# Patient Record
Sex: Female | Born: 1955 | Race: White | Hispanic: No | Marital: Married | Smoking: Never smoker
Health system: Southern US, Community
[De-identification: ages and names within clinical notes are randomized; demographics above are authoritative.]

## PROBLEM LIST (undated history)

## (undated) DIAGNOSIS — M1990 Unspecified osteoarthritis, unspecified site: Secondary | ICD-10-CM

## (undated) DIAGNOSIS — E785 Hyperlipidemia, unspecified: Secondary | ICD-10-CM

## (undated) DIAGNOSIS — K2100 Gastro-esophageal reflux disease with esophagitis, without bleeding: Secondary | ICD-10-CM

## (undated) DIAGNOSIS — M25569 Pain in unspecified knee: Secondary | ICD-10-CM

## (undated) DIAGNOSIS — M722 Plantar fascial fibromatosis: Secondary | ICD-10-CM

## (undated) DIAGNOSIS — E669 Obesity, unspecified: Secondary | ICD-10-CM

## (undated) DIAGNOSIS — J45909 Unspecified asthma, uncomplicated: Secondary | ICD-10-CM

## (undated) DIAGNOSIS — J309 Allergic rhinitis, unspecified: Secondary | ICD-10-CM

## (undated) DIAGNOSIS — K219 Gastro-esophageal reflux disease without esophagitis: Secondary | ICD-10-CM

## (undated) DIAGNOSIS — E7800 Pure hypercholesterolemia, unspecified: Secondary | ICD-10-CM

## (undated) DIAGNOSIS — I10 Essential (primary) hypertension: Secondary | ICD-10-CM

## (undated) HISTORY — DX: Pain in unspecified knee: M25.569

## (undated) HISTORY — PX: ABDOMINAL HYSTERECTOMY: SHX81

## (undated) HISTORY — DX: Essential (primary) hypertension: I10

## (undated) HISTORY — DX: Gastro-esophageal reflux disease with esophagitis, without bleeding: K21.00

## (undated) HISTORY — DX: Plantar fascial fibromatosis: M72.2

## (undated) HISTORY — PX: OTHER SURGICAL HISTORY: SHX169

## (undated) HISTORY — PX: TUBAL LIGATION: SHX77

## (undated) HISTORY — DX: Hyperlipidemia, unspecified: E78.5

## (undated) HISTORY — DX: Unspecified osteoarthritis, unspecified site: M19.90

## (undated) HISTORY — PX: DILATION AND CURETTAGE OF UTERUS: SHX78

## (undated) HISTORY — DX: Allergic rhinitis, unspecified: J30.9

## (undated) HISTORY — DX: Obesity, unspecified: E66.9

## (undated) MED FILL — FINASTERIDE 1 MG TABLET: 1 1 mg | ORAL | 90 days supply | Qty: 90 | Fill #2 | Status: CN

## (undated) MED FILL — FINASTERIDE 1 MG TABLET: 1 1 mg | ORAL | 90 days supply | Qty: 90 | Fill #1

## (undated) MED FILL — FINASTERIDE 1 MG TABLET: 1 mg | ORAL | 90 days supply | Qty: 30 | Fill #3

## (undated) MED FILL — FINASTERIDE 1 MG TABLET: 1 1 mg | ORAL | 90 days supply | Qty: 90 | Fill #2

## (undated) MED FILL — FINASTERIDE 1 MG TABLET: 1 mg | ORAL | 90 days supply | Qty: 90 | Fill #0

## (undated) MED FILL — FINASTERIDE 1 MG TABLET: 1 mg | ORAL | 90 days supply | Qty: 45 | Fill #2

---

## 1999-04-13 ENCOUNTER — Encounter: Payer: Self-pay | Admitting: *Deleted

## 1999-04-13 ENCOUNTER — Ambulatory Visit (HOSPITAL_COMMUNITY): Admission: RE | Admit: 1999-04-13 | Discharge: 1999-04-13 | Payer: Self-pay | Admitting: *Deleted

## 1999-04-19 ENCOUNTER — Other Ambulatory Visit: Admission: RE | Admit: 1999-04-19 | Discharge: 1999-04-19 | Payer: Self-pay | Admitting: *Deleted

## 2000-04-17 ENCOUNTER — Ambulatory Visit (HOSPITAL_COMMUNITY): Admission: RE | Admit: 2000-04-17 | Discharge: 2000-04-17 | Payer: Self-pay | Admitting: *Deleted

## 2000-04-17 ENCOUNTER — Encounter: Payer: Self-pay | Admitting: *Deleted

## 2000-06-19 ENCOUNTER — Other Ambulatory Visit: Admission: RE | Admit: 2000-06-19 | Discharge: 2000-06-19 | Payer: Self-pay | Admitting: *Deleted

## 2001-06-02 ENCOUNTER — Other Ambulatory Visit: Admission: RE | Admit: 2001-06-02 | Discharge: 2001-06-02 | Payer: Self-pay | Admitting: *Deleted

## 2001-06-02 ENCOUNTER — Ambulatory Visit (HOSPITAL_COMMUNITY): Admission: RE | Admit: 2001-06-02 | Discharge: 2001-06-02 | Payer: Self-pay | Admitting: *Deleted

## 2001-06-02 ENCOUNTER — Encounter: Payer: Self-pay | Admitting: *Deleted

## 2001-11-12 ENCOUNTER — Ambulatory Visit (HOSPITAL_COMMUNITY): Admission: RE | Admit: 2001-11-12 | Discharge: 2001-11-12 | Payer: Self-pay | Admitting: Pulmonary Disease

## 2001-11-20 ENCOUNTER — Ambulatory Visit (HOSPITAL_COMMUNITY): Admission: RE | Admit: 2001-11-20 | Discharge: 2001-11-20 | Payer: Self-pay | Admitting: Pulmonary Disease

## 2001-12-23 ENCOUNTER — Ambulatory Visit (HOSPITAL_COMMUNITY): Admission: RE | Admit: 2001-12-23 | Discharge: 2001-12-23 | Payer: Self-pay | Admitting: Pulmonary Disease

## 2001-12-30 ENCOUNTER — Ambulatory Visit (HOSPITAL_COMMUNITY): Admission: RE | Admit: 2001-12-30 | Discharge: 2001-12-30 | Payer: Self-pay | Admitting: Internal Medicine

## 2002-06-02 ENCOUNTER — Other Ambulatory Visit: Admission: RE | Admit: 2002-06-02 | Discharge: 2002-06-02 | Payer: Self-pay | Admitting: *Deleted

## 2003-06-07 ENCOUNTER — Encounter: Payer: Self-pay | Admitting: *Deleted

## 2003-06-07 ENCOUNTER — Ambulatory Visit (HOSPITAL_COMMUNITY): Admission: RE | Admit: 2003-06-07 | Discharge: 2003-06-07 | Payer: Self-pay | Admitting: *Deleted

## 2003-10-17 ENCOUNTER — Other Ambulatory Visit: Admission: RE | Admit: 2003-10-17 | Discharge: 2003-10-17 | Payer: Self-pay | Admitting: *Deleted

## 2004-07-24 ENCOUNTER — Ambulatory Visit (HOSPITAL_COMMUNITY): Admission: RE | Admit: 2004-07-24 | Discharge: 2004-07-24 | Payer: Self-pay | Admitting: *Deleted

## 2004-10-15 ENCOUNTER — Other Ambulatory Visit: Admission: RE | Admit: 2004-10-15 | Discharge: 2004-10-15 | Payer: Self-pay | Admitting: *Deleted

## 2005-10-11 ENCOUNTER — Ambulatory Visit (HOSPITAL_COMMUNITY): Admission: RE | Admit: 2005-10-11 | Discharge: 2005-10-11 | Payer: Self-pay | Admitting: *Deleted

## 2005-10-16 ENCOUNTER — Other Ambulatory Visit: Admission: RE | Admit: 2005-10-16 | Discharge: 2005-10-16 | Payer: Self-pay | Admitting: *Deleted

## 2005-10-21 ENCOUNTER — Ambulatory Visit (HOSPITAL_COMMUNITY): Admission: RE | Admit: 2005-10-21 | Discharge: 2005-10-21 | Payer: Self-pay | Admitting: *Deleted

## 2005-10-29 ENCOUNTER — Ambulatory Visit (HOSPITAL_COMMUNITY): Admission: RE | Admit: 2005-10-29 | Discharge: 2005-10-29 | Payer: Self-pay | Admitting: Pulmonary Disease

## 2006-11-10 ENCOUNTER — Ambulatory Visit (HOSPITAL_COMMUNITY): Admission: RE | Admit: 2006-11-10 | Discharge: 2006-11-10 | Payer: Self-pay | Admitting: *Deleted

## 2006-12-11 ENCOUNTER — Other Ambulatory Visit: Admission: RE | Admit: 2006-12-11 | Discharge: 2006-12-11 | Payer: Self-pay | Admitting: *Deleted

## 2007-01-21 LAB — T4, FREE: T4, Free: 0.9 ng/dL (ref 0.6–1.2)

## 2007-01-21 LAB — TSH: TSH - Thyroid Stimulating Hormone: 0.6 microIU/mL (ref 0.40–5.80)

## 2007-08-03 LAB — CBC WITH AUTO DIFFERENTIAL
Basophils %: 0 % (ref 0–2)
Basophils, Absolute: 0 10*3/uL (ref 0–0.2)
Eosinophils %: 2 % (ref 0–7)
Eosinophils, Absolute: 0.1 10*3/uL (ref 0–0.7)
HCT: 42.2 % (ref 37.0–48.0)
Hgb: 14.3 gm/dL (ref 12.0–16.0)
Lymphocytes %: 28 % (ref 25–45)
Lymphocytes, Absolute: 1.5 10*3/uL (ref 1.1–4.3)
MCH: 31.3 pg (ref 27–34)
MCHC: 34 gm/dL (ref 32–36)
MCV: 91.9 fL (ref 81–99)
MPV: 9.8 fL (ref 7.4–10.4)
Monocytes %: 10 % (ref 0–12)
Monocytes, Absolute: 0.5 10*3/uL (ref 0–1.2)
Neutrophils, Absolute: 3.3 10*3/uL (ref 1.6–7.3)
Platelet Count: 207 10*3/uL (ref 150–400)
RBC: 4.59 10*6/uL (ref 4.20–5.40)
RDW: 12.6 % (ref 11.5–14.5)
Segs (Neutrophils)%: 60 % (ref 35–70)
WBC: 5.5 10*3/uL (ref 4.8–10.8)

## 2007-08-03 LAB — URINALYSIS, ROUTINE
Bilirubin, urine: NEGATIVE
Glucose, UA: NEGATIVE mg/dl
Ketones, urine: 40 — AB
Nitrite, urine: NEGATIVE
Protein, urine: NEGATIVE
Specific Gravity, urine: 1.02 (ref 1.002–1.035)
Urobilinogen, urine: NEGATIVE EU/dL
pH, UA: 6 (ref 5.0–9.0)

## 2007-08-03 LAB — TYPE AND SCREEN
ABO/Rh (D): A POS
ABO/Rh recheck: A POS
Antibody Screen: NEGATIVE

## 2007-08-03 LAB — UA, MICROSCOPIC ONLY
RBC, UA: 5 /HPF — AB
Squamous Epithelial, UA: 0 /HPF (ref 0–5)
WBC, UA: >25 /HPF — AB

## 2007-08-04 LAB — CBC WITH AUTO DIFFERENTIAL
Bands %: 1 % (ref 0–10)
Bands, Absolute: 0.07 10*3/uL (ref 0–1.20)
Basophils %: 0 % (ref 0–2)
Basophils, Absolute: 0 10*3/uL (ref 0–0.2)
Eosinophils %: 0 % (ref 0–7)
Eosinophils, Absolute: 0 10*3/uL (ref 0–0.7)
HCT: 37 % (ref 37.0–48.0)
Hgb: 12.4 gm/dL (ref 12.0–16.0)
Lymphocytes %: 11 % — ABNORMAL LOW (ref 25–45)
Lymphocytes %: 8 % — ABNORMAL LOW (ref 25–45)
Lymphocytes, Absolute: 0.6 10*3/uL — ABNORMAL LOW (ref 1.1–4.3)
Lymphocytes, Absolute: 0.81 10*3/uL — ABNORMAL LOW (ref 1.10–4.30)
MCH: 31.3 pg (ref 27–34)
MCHC: 33.5 gm/dL (ref 32–36)
MCV: 93.3 fL (ref 81–99)
MPV: 10.5 fL — ABNORMAL HIGH (ref 7.4–10.4)
Monocytes %: 7 % (ref 0–12)
Monocytes %: 8 % (ref 0–12)
Monocytes, Absolute: 0.5 10*3/uL (ref 0–1.2)
Monocytes, Absolute: 0.59 10*3/uL (ref 0–1.20)
Neutrophils, Absolute: 6.4 10*3/uL (ref 1.6–7.3)
Platelet Count: 178 10*3/uL (ref 150–400)
Platelet Estimate: NORMAL
RBC Morphology: NORMAL
RBC: 3.97 10*6/uL — ABNORMAL LOW (ref 4.20–5.40)
RDW: 13.3 % (ref 11.5–14.5)
Segs %: 80 % — ABNORMAL HIGH (ref 35–70)
Segs (Neutrophils)%: 85 % — ABNORMAL HIGH (ref 35–70)
Segs, Absolute: 5.93 10*3/uL (ref 1.60–7.30)
WBC: 7.4 10*3/uL (ref 4.8–10.8)

## 2007-09-16 LAB — PROGESTERONE: Progesterone: 1.5 ng/mL (ref 0.1–20.0)

## 2007-09-16 LAB — 25-OH HYDROXY VITAMIN D, CALCIFEROL, TOTAL OF D2 & D3
Vitamin D Total: 27 ng/mL (ref 25–80)
Vitamin D, 25-OH Total: <4 ng/mL
Vitiamin D 25 Hydroxy D3: 27 ng/mL

## 2007-09-16 LAB — ESTRADIOL: Estradiol: 18 pg/mL

## 2008-01-04 ENCOUNTER — Encounter: Admission: RE | Admit: 2008-01-04 | Discharge: 2008-01-04 | Payer: Self-pay | Admitting: *Deleted

## 2008-01-12 ENCOUNTER — Encounter: Admission: RE | Admit: 2008-01-12 | Discharge: 2008-01-12 | Payer: Self-pay | Admitting: *Deleted

## 2008-02-05 LAB — ESTRADIOL: Estradiol: 15 pg/mL

## 2008-02-05 LAB — 25-OH HYDROXY VITAMIN D, CALCIFEROL, TOTAL OF D2 & D3
Vitamin D Total: 66 ng/mL (ref 25–80)
Vitamin D, 25-OH Total: <4 ng/mL
Vitiamin D 25 Hydroxy D3: 66 ng/mL

## 2008-02-05 LAB — PROGESTERONE: Progesterone: 1.1 ng/mL (ref 0.1–20.0)

## 2008-03-24 ENCOUNTER — Ambulatory Visit (HOSPITAL_COMMUNITY): Admission: RE | Admit: 2008-03-24 | Discharge: 2008-03-24 | Payer: Self-pay | Admitting: Pulmonary Disease

## 2008-03-30 ENCOUNTER — Encounter (HOSPITAL_COMMUNITY): Admission: RE | Admit: 2008-03-30 | Discharge: 2008-04-29 | Payer: Self-pay | Admitting: Pulmonary Disease

## 2008-07-13 LAB — COMPREHENSIVE METABOLIC PANEL
ALT - Alanine Amino transferase: 19 IU/L (ref 5–40)
AST - Aspartate Aminotransferase: 24 IU/L (ref 10–42)
Albumin/Globulin Ratio: 1.3 (ref >0.9)
Albumin: 3.8 gm/dL (ref 3.5–5.0)
Alkaline Phosphatase: 35 IU/L — ABNORMAL LOW (ref 37–107)
Anion Gap: 11 mmol/L (ref 3–11)
BUN: 15 mg/dL (ref 8–21)
Bilirubin, Total: 0.5 mg/dL (ref 0.2–1.2)
CO2 - Carbon Dioxide: 27 mmol/L (ref 22–31)
Calcium: 9.2 mg/dL (ref 8.5–10.5)
Chloride: 99 mmol/L (ref 98–111)
Creatinine, Serum: 0.6 mg/dL (ref 0.6–1.3)
GFR Estimate: >60 mL/min/{1.73_m2} (ref >60)
Globulin: 3 gm/dL (ref 2.2–3.7)
Glucose: 95 mg/dL (ref 80–99)
Potassium: 4 mmol/L (ref 3.5–5.1)
Protein, Total: 6.8 gm/dL (ref 6.1–7.9)
Sodium: 137 mmol/L (ref 133–147)

## 2008-07-13 LAB — ESTRADIOL: Estradiol: 83 pg/mL

## 2008-07-13 LAB — 25-OH HYDROXY VITAMIN D, CALCIFEROL, TOTAL OF D2 & D3
Vitamin D Total: 58 ng/mL (ref 25–80)
Vitamin D, 25-OH Total: <4 ng/mL
Vitiamin D 25 Hydroxy D3: 58 ng/mL

## 2008-07-13 LAB — PROGESTERONE: Progesterone: 6.2 ng/mL (ref 0.1–20.0)

## 2008-07-13 LAB — T3, FREE: T3, Free: 2.8 pg/mL (ref 2.4–3.9)

## 2008-07-13 LAB — TSH: TSH - Thyroid Stimulating Hormone: 0.59 microIU/mL (ref 0.40–5.80)

## 2010-05-10 LAB — CBC WITH AUTO DIFFERENTIAL
Basophils %: 0 % (ref 0–2)
Basophils, Absolute: 0 10*3/uL (ref 0–0.2)
Eosinophils %: 1 % (ref 0–7)
Eosinophils, Absolute: 0.1 10*3/uL (ref 0–0.7)
HCT: 42.7 % (ref 37.0–48.0)
Hgb: 14.2 gm/dL (ref 12.0–16.0)
Lymphocytes %: 24 % — ABNORMAL LOW (ref 25–45)
Lymphocytes, Absolute: 1.9 10*3/uL (ref 1.1–4.3)
MCH: 31.6 pg (ref 27–34)
MCHC: 33.3 gm/dL (ref 32–36)
MCV: 94.7 fL (ref 81–99)
MPV: 10.7 fL — ABNORMAL HIGH (ref 7.4–10.4)
Monocytes %: 6 % (ref 0–12)
Monocytes, Absolute: 0.5 10*3/uL (ref 0–1.2)
Neutrophils, Absolute: 5.4 10*3/uL (ref 1.6–7.3)
Platelet Count: 178 10*3/uL (ref 150–400)
RBC: 4.51 10*6/uL (ref 4.20–5.40)
RDW: 13.6 % (ref 11.5–14.5)
Segs (Neutrophils)%: 68 % (ref 35–70)
WBC: 8 10*3/uL (ref 4.8–10.8)

## 2010-05-10 LAB — CORONARY RISK LIPID PANEL
Cholesterol (CRISK): 220 mg/dL — ABNORMAL HIGH (ref <200)
Cholesterol, HDL: 70 mg/dL (ref 35–80)
LDL Calculated: 140 mg/dL — ABNORMAL HIGH (ref <100)
Triglycerides (Crisk): 49 mg/dL (ref 40–210)

## 2010-05-10 LAB — HOMOCYSTEINE, PLASMA: Homocysteine: 8.3 micromol/L (ref 5.0–13.9)

## 2010-05-10 LAB — TESTOSTERONE, TOTAL AND FREE, SERUM (MAYO)
Testosterone, Free (ng/dL): 0.2 ng/dL — ABNORMAL LOW (ref 0.3–1.9)
Testosterone, Total: 16 ng/dL (ref 8–60)

## 2010-05-10 LAB — DHEA-SULFATE PANEL: DHEA-Sulfate Panel: 146 microgm/dL (ref 8–188)

## 2010-05-10 LAB — 25-OH HYDROXY VITAMIN D, CALCIFEROL, TOTAL OF D2 & D3: Vitamin D, 25-OH Total: 61 ng/mL (ref 30–100)

## 2010-05-10 LAB — T3, FREE: T3, Free: 2.9 pg/mL (ref 2.4–3.9)

## 2010-05-10 LAB — ESTRADIOL: Estradiol: 40 pg/mL

## 2010-05-10 LAB — FOLLICLE STIMULATING HORMONE: FSH: 67.7 m[IU]/mL

## 2010-05-10 LAB — TSH: TSH - Thyroid Stimulating Hormone: 1.07 microIU/mL (ref 0.40–5.80)

## 2010-05-10 LAB — PROGESTERONE: Progesterone: 2.3 ng/mL (ref 0.1–18.6)

## 2010-09-14 LAB — ESTRADIOL: Estradiol: 138 pg/mL

## 2010-09-14 LAB — PROGESTERONE: Progesterone: 2.5 ng/mL (ref 0.1–18.6)

## 2010-09-14 LAB — HOMOCYSTEINE, PLASMA: Homocysteine: 2.1 micromol/L — ABNORMAL LOW (ref 5.0–13.9)

## 2010-09-16 ENCOUNTER — Encounter: Payer: Self-pay | Admitting: *Deleted

## 2010-10-23 LAB — CORONARY RISK LIPID PANEL
Cholesterol (CRISK): 206 mg/dL — ABNORMAL HIGH (ref <200)
Cholesterol, HDL: 71 mg/dL (ref 35–80)
LDL Calculated: 123 mg/dL — ABNORMAL HIGH (ref <100)
Triglycerides (Crisk): 59 mg/dL (ref 40–210)

## 2010-10-23 LAB — COMPREHENSIVE METABOLIC PANEL
ALT - Alanine Amino transferase: 18 IU/L (ref 10–45)
AST - Aspartate Aminotransferase: 23 IU/L (ref 10–42)
Albumin/Globulin Ratio: 1.2 (ref >0.9)
Albumin: 3.8 gm/dL (ref 3.5–5.0)
Alkaline Phosphatase: 35 IU/L — ABNORMAL LOW (ref 37–107)
Anion Gap: 8 mmol/L (ref 3–11)
BUN: 10 mg/dL (ref 8–21)
Bilirubin, Total: 0.8 mg/dL (ref 0.2–1.4)
CO2 - Carbon Dioxide: 25 mmol/L (ref 22–31)
Calcium: 9.1 mg/dL (ref 8.6–10.0)
Chloride: 104 mmol/L (ref 98–111)
Creatinine, Serum: 0.69 mg/dL (ref 0.44–1.03)
GFR Estimate: >60 mL/min/{1.73_m2} (ref >60)
Globulin: 3.3 gm/dL (ref 2.2–3.7)
Glucose: 81 mg/dL (ref 80–99)
Potassium: 3.5 mmol/L (ref 3.5–5.1)
Protein, Total: 7.1 gm/dL (ref 6.1–7.9)
Sodium: 137 mmol/L (ref 135–143)

## 2010-10-23 LAB — CBC WITH AUTO DIFFERENTIAL
Basophils %: 1 % (ref 0–2)
Basophils, Absolute: 0 10*3/uL (ref 0–0.2)
Eosinophils %: 2 % (ref 0–7)
Eosinophils, Absolute: 0.1 10*3/uL (ref 0–0.7)
HCT: 41.8 % (ref 37.0–48.0)
Hgb: 13.9 gm/dL (ref 12.0–16.0)
Lymphocytes %: 26 % (ref 25–45)
Lymphocytes, Absolute: 1.7 10*3/uL (ref 1.1–4.3)
MCH: 31.9 pg (ref 27–34)
MCHC: 33.2 gm/dL (ref 32–36)
MCV: 96 fL (ref 81–99)
MPV: 10.9 fL — ABNORMAL HIGH (ref 7.4–10.4)
Monocytes %: 9 % (ref 0–12)
Monocytes, Absolute: 0.6 10*3/uL (ref 0–1.2)
Neutrophils, Absolute: 4.1 10*3/uL (ref 1.6–7.3)
Platelet Count: 194 10*3/uL (ref 150–400)
RBC: 4.36 10*6/uL (ref 4.20–5.40)
RDW: 13.5 % (ref 11.5–14.5)
Segs (Neutrophils)%: 62 % (ref 35–70)
WBC: 6.5 10*3/uL (ref 4.8–10.8)

## 2010-10-23 LAB — TSH: TSH - Thyroid Stimulating Hormone: 0.53 microIU/mL (ref 0.40–5.80)

## 2010-12-06 ENCOUNTER — Ambulatory Visit (HOSPITAL_COMMUNITY)
Admission: RE | Admit: 2010-12-06 | Discharge: 2010-12-06 | Disposition: A | Discharge status: Home / Self Care | Payer: BC Managed Care – PPO | Source: Ambulatory Visit | Attending: Pulmonary Disease | Admitting: Pulmonary Disease

## 2010-12-06 ENCOUNTER — Other Ambulatory Visit (HOSPITAL_COMMUNITY): Payer: Self-pay | Admitting: Pulmonary Disease

## 2010-12-06 DIAGNOSIS — M5030 Other cervical disc degeneration, unspecified cervical region: Secondary | ICD-10-CM | POA: Insufficient documentation

## 2010-12-06 DIAGNOSIS — M542 Cervicalgia: Secondary | ICD-10-CM | POA: Insufficient documentation

## 2010-12-06 DIAGNOSIS — R6884 Jaw pain: Secondary | ICD-10-CM

## 2010-12-06 DIAGNOSIS — M436 Torticollis: Secondary | ICD-10-CM

## 2010-12-19 ENCOUNTER — Ambulatory Visit (HOSPITAL_COMMUNITY)
Admission: RE | Admit: 2010-12-19 | Discharge: 2010-12-19 | Disposition: A | Discharge status: Home / Self Care | Payer: BC Managed Care – PPO | Source: Ambulatory Visit | Attending: Pulmonary Disease | Admitting: Pulmonary Disease

## 2010-12-19 DIAGNOSIS — M6281 Muscle weakness (generalized): Secondary | ICD-10-CM | POA: Insufficient documentation

## 2010-12-19 DIAGNOSIS — I10 Essential (primary) hypertension: Secondary | ICD-10-CM | POA: Insufficient documentation

## 2010-12-19 DIAGNOSIS — IMO0001 Reserved for inherently not codable concepts without codable children: Secondary | ICD-10-CM | POA: Insufficient documentation

## 2010-12-19 DIAGNOSIS — M542 Cervicalgia: Secondary | ICD-10-CM | POA: Insufficient documentation

## 2010-12-19 DIAGNOSIS — M2569 Stiffness of other specified joint, not elsewhere classified: Secondary | ICD-10-CM | POA: Insufficient documentation

## 2010-12-25 ENCOUNTER — Ambulatory Visit (HOSPITAL_COMMUNITY)
Admission: RE | Admit: 2010-12-25 | Discharge: 2010-12-25 | Disposition: A | Discharge status: Home / Self Care | Payer: BC Managed Care – PPO | Source: Ambulatory Visit | Attending: Pulmonary Disease | Admitting: Pulmonary Disease

## 2010-12-25 DIAGNOSIS — I10 Essential (primary) hypertension: Secondary | ICD-10-CM | POA: Insufficient documentation

## 2010-12-25 DIAGNOSIS — M542 Cervicalgia: Secondary | ICD-10-CM | POA: Insufficient documentation

## 2010-12-25 DIAGNOSIS — M2569 Stiffness of other specified joint, not elsewhere classified: Secondary | ICD-10-CM | POA: Insufficient documentation

## 2010-12-25 DIAGNOSIS — M6281 Muscle weakness (generalized): Secondary | ICD-10-CM | POA: Insufficient documentation

## 2010-12-25 DIAGNOSIS — IMO0001 Reserved for inherently not codable concepts without codable children: Secondary | ICD-10-CM | POA: Insufficient documentation

## 2010-12-28 ENCOUNTER — Ambulatory Visit (HOSPITAL_COMMUNITY)
Admission: RE | Admit: 2010-12-28 | Discharge: 2010-12-28 | Disposition: A | Discharge status: Home / Self Care | Payer: BC Managed Care – PPO | Source: Ambulatory Visit | Attending: *Deleted | Admitting: *Deleted

## 2011-01-01 ENCOUNTER — Ambulatory Visit (HOSPITAL_COMMUNITY)
Admission: RE | Admit: 2011-01-01 | Discharge: 2011-01-01 | Disposition: A | Discharge status: Home / Self Care | Payer: BC Managed Care – PPO | Source: Ambulatory Visit | Attending: *Deleted | Admitting: *Deleted

## 2011-01-03 ENCOUNTER — Ambulatory Visit (HOSPITAL_COMMUNITY)
Admission: RE | Admit: 2011-01-03 | Discharge: 2011-01-03 | Disposition: A | Discharge status: Home / Self Care | Payer: BC Managed Care – PPO | Source: Ambulatory Visit | Attending: *Deleted | Admitting: *Deleted

## 2011-01-10 ENCOUNTER — Ambulatory Visit (HOSPITAL_COMMUNITY)
Admission: RE | Admit: 2011-01-10 | Discharge: 2011-01-10 | Disposition: A | Discharge status: Home / Self Care | Payer: BC Managed Care – PPO | Source: Ambulatory Visit | Attending: *Deleted | Admitting: *Deleted

## 2011-01-11 NOTE — Consult Note (Signed)
[facility]  Patient:    Samantha Larsen, Samantha Larsen Visit Number: [id number] MRN: [id number]          Service Type: OUT Location: RAD Attending Physician:  Fredirick Maudlin Dictated by:   Gardiner Coins, P.A.-C. Proc. Date: 12/15/01 Admit Date:  11/20/2001 Discharge Date: 11/20/2001   CC:         Kari Baars, M.D.   Consultation Report  DATE OF BIRTH:  08-Nov-1955.  S.S. NUMBER:  [id number].  REFERRING PHYSICIAN:  Kari Baars, M.D.  CHIEF COMPLAINT:  Questions about cancer screening.  HISTORY OF PRESENT ILLNESS:  Ms. Heyde is a very nice 55 year old Caucasian female referred through the courtesy of Dr. Juanetta Gosling to further discuss colorectal cancer screening.  In January of 2003, she developed some left lower quadrant, left hip pain that radiated down her leg.  She initially thought symptoms may be coming from her ovaries and on ultrasound she, in fact, had left-sided ovarian cysts.  Followup ultrasound 4 weeks later showed resolution of the cyst but she still was having symptoms.  Subsequently had CT and MRI that revealed a ruptured disk in her back that was felt to be the cause of her pain.  She has since been started on prednisone as well as Relafen, ultram and OxyCodone and she has had significant improvement in the pain.  During this time period she had a paternal aunt diagnosed with colon cancer around age 16.  Her father subsequently underwent colonoscopy that revealed multiple hyperplastic polyps.  Also had a paternal great great who had colon cancer as well as bladder cancer.  With her family history she is concerned about initiating colorectal cancer screening.  Since being diagnosed with the ruptured disk in her back she has done fairly well.  She has had heartburn for many years now.  She was initially on Zantac. Underwent EGD by Dr. Lovell Sheehan 6 to 8 years ago at which time she was started on Prevacid 30 mg q.d.  She has no  heartburn or indigestion as long as she takes her Prevacid but if she misses a dose symptoms quickly return.  No nausea or vomiting. Does have intermittent solid food dysphagia for as long as she can remember to certain foods, such as waffles with syrup, soy products, and hamburgers.  She does not feel this is a significant problem for her at this time.  No abdominal pain currently.  Moves her bowels every day.  Had tended towards constipation, "all of my life", but since starting on Prevacid she has had no problems with constipation.  She does see occasional bright red blood per rectum on the toilet tissue when she does have problems with constipation but has not seen any recently.  Last episode was last summer when they were on vacation when she developed some constipation and saw some blood then.  No melena.  Appetite has been good.  She has had weight gain in the last couple of months since being on steroids.  CURRENT MEDICATIONS: 1. Medrol dosepak x11 days which she is just finishing up. 2. Prednisone earlier this year. 3. Relafen 750 mg 2 q.d. x3 weeks for which she has a couple more doses. 4. Ultram 5 mg q.d. p.r.n. 5. Oxycodone p.r.n. q.h.s. 6. ______ 1 b.i.d. p.r.n. 7. Prevacid 30 mg q.d. 8. Clobetasol cream p.r.n.  Prior to back problems the only medication she had been on chronically was the Prevacid.  ALLERGIES:  PENICILLIN causes "welts".  PAST MEDICAL HISTORY:  History of seasonal allergic rhinitis and history of ruptured disk in the back.  PAST SURGICAL HISTORY:  Tubal ligation.  FAMILY HISTORY:  Colon cancer as outlined in HPI.  No family history of chronic liver disease.  She does have a maternal cousin with Crohns disease.  SOCIAL HISTORY:  The patient has been married for 29 years and has a 17 year old son.  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  She does not use any tobacco, alcohol or  illegal drugs.  REVIEW OF SYSTEMS:  As in HPI.  In addition no syncope, chest pain, palpitations, history of MI, dyspnea, cough, wheezing, dysuria, hematuria, vaginal itching, burning or discharge.  PHYSICAL EXAMINATION:  VITAL SIGNS:  Weight 213 pounds.  Height 5 feet 1 inch.  Temperature 98.6. Blood pressure 158/90, pulse 70 and regular.  GENERAL:  Very pleasant, cooperative, alert, well-developed well-nourished woman in no acute distress.  Answers questions quickly and appropriately.  HEENT:  Atraumatic, normocephalic.  PERRL.  Sclerae nonicteric.  Conjunctivae clear.  Oropharynx is pink and moist without lesion, erythema or exudate.  NECK:  Supple, no masses, no adenopathy.  Tracheal midline.  No thyromegaly.  LUNGS: Clear to auscultation bilaterally with symmetrical expansion.  CARDIOVASCULAR:  S1, S2 regular rate and rhythm without murmur.  ABDOMEN:  Positive normoactive bowel sounds.  Soft, full, nontender, nondistended.  Symmetrical.  No guarding.  RECTAL:  Adequate sphincter tone.  Small, soft, anterior, sentinel tag.  No masses in the anal canal or rectal vault.  Small amount of soft brown stool in the rectal vault.  Hemoccult negative.  EXTREMITIES:  No lower extremity edema.  SKIN: Warm and dry without jaundice.  IMPRESSION: 1. COLON CANCER:  The patient is a very nice 55 year old woman with second degree family history of colon cancer and personal history of intermittent hematochezia.  Based on history it sounds as if her hematochezia is coming from benign anorectal source but would like to further evaluate with total colonoscopy of her alternative sources of bleeding such as polyp especially in light of a family history of colon cancer.  Total colonoscopy was reviewed with the patient including potential risks, benefits and alternatives. Questions are answered and she is agreeable to proceeding with procedure.   2. GASTROESOPHAGEAL REFLUX DISEASE:  Symptoms  are very well controlled on daily Prevacid.  Has some intermittent dysphagia but at this point she feels this is no significant problem for her.  If it becomes more of a problem can consider esophagogram or EGD.  RECOMMENDATIONS: 1. Total colonoscopy at [facility] in the near future. 2. Continue Prevacid 30 mg p.o. q.d. 30 minutes before a meal. 3. The patient is to let us know should she have further problems with    dysphagia at which time we consider esophagogram or EGD.  We would like to thank Dr. Juanetta Gosling for allowing [place] to participate in the care of this very nice woman. Dictated by:   Gardiner Coins, P.A.-C. Attending Physician:  Fredirick Maudlin DD:  12/15/01 TD:  12/15/01 Job: [id number]

## 2011-01-14 ENCOUNTER — Ambulatory Visit (HOSPITAL_COMMUNITY)
Admission: RE | Admit: 2011-01-14 | Discharge: 2011-01-14 | Disposition: A | Discharge status: Home / Self Care | Payer: BC Managed Care – PPO | Source: Ambulatory Visit | Attending: *Deleted | Admitting: *Deleted

## 2011-01-16 ENCOUNTER — Ambulatory Visit (HOSPITAL_COMMUNITY)
Admission: RE | Admit: 2011-01-16 | Discharge: 2011-01-16 | Disposition: A | Discharge status: Home / Self Care | Payer: BC Managed Care – PPO | Source: Ambulatory Visit | Attending: *Deleted | Admitting: *Deleted

## 2011-01-18 LAB — ESTRADIOL: Estradiol: 88 pg/mL

## 2011-01-30 ENCOUNTER — Ambulatory Visit (HOSPITAL_COMMUNITY)
Admission: RE | Admit: 2011-01-30 | Discharge: 2011-01-30 | Disposition: A | Discharge status: Home / Self Care | Payer: BC Managed Care – PPO | Source: Ambulatory Visit | Attending: Pulmonary Disease | Admitting: Pulmonary Disease

## 2011-01-30 DIAGNOSIS — M2569 Stiffness of other specified joint, not elsewhere classified: Secondary | ICD-10-CM | POA: Insufficient documentation

## 2011-01-30 DIAGNOSIS — IMO0001 Reserved for inherently not codable concepts without codable children: Secondary | ICD-10-CM | POA: Insufficient documentation

## 2011-01-30 DIAGNOSIS — M6281 Muscle weakness (generalized): Secondary | ICD-10-CM | POA: Insufficient documentation

## 2011-01-30 DIAGNOSIS — M542 Cervicalgia: Secondary | ICD-10-CM | POA: Insufficient documentation

## 2011-01-30 DIAGNOSIS — I10 Essential (primary) hypertension: Secondary | ICD-10-CM | POA: Insufficient documentation

## 2012-01-31 ENCOUNTER — Encounter (INDEPENDENT_AMBULATORY_CARE_PROVIDER_SITE_OTHER): Payer: Self-pay

## 2012-09-18 ENCOUNTER — Inpatient Hospital Stay: Payer: PRIVATE HEALTH INSURANCE | Attending: Family | Primary: MD

## 2012-09-18 DIAGNOSIS — Z0000 Encounter for general adult medical examination without abnormal findings: Secondary | ICD-10-CM

## 2012-09-18 LAB — COMPREHENSIVE METABOLIC PANEL
ALT - Alanine Amino transferase: 22 IU/L (ref 10–45)
AST - Aspartate Aminotransferase: 24 IU/L (ref 10–42)
Albumin/Globulin Ratio: 1.1 (ref >0.9)
Albumin: 3.7 gm/dL (ref 3.5–5.0)
Alkaline Phosphatase: 33 IU/L — ABNORMAL LOW (ref 37–107)
Anion Gap: 9 mmol/L (ref 3–11)
BUN: 11 mg/dL (ref 8–21)
Bilirubin, Total: 0.7 mg/dL (ref 0.2–1.4)
CO2 - Carbon Dioxide: 25 mmol/L (ref 22–31)
Calcium: 9.6 mg/dL (ref 8.6–10.0)
Chloride: 105 mmol/L (ref 98–111)
Creatinine, Serum: 0.67 mg/dL (ref 0.44–1.03)
GFR Estimate: >60 mL/min/{1.73_m2} (ref >60)
Globulin: 3.5 gm/dL (ref 2.2–3.7)
Glucose: 104 mg/dL — ABNORMAL HIGH (ref 80–99)
Potassium: 3.9 mmol/L (ref 3.5–5.1)
Protein, Total: 7.2 gm/dL (ref 6.1–7.9)
Sodium: 139 mmol/L (ref 135–143)

## 2012-09-18 LAB — ESTRADIOL: Estradiol: 353 pg/mL

## 2012-09-18 LAB — CBC WITH AUTO DIFFERENTIAL
Basophils %: 1 % (ref 0–2)
Basophils, Absolute: 0 10*3/uL (ref 0–0.2)
Eosinophils %: 5 % (ref 0–7)
Eosinophils, Absolute: 0.3 10*3/uL (ref 0–0.7)
HCT: 42.9 % (ref 37.0–48.0)
Hgb: 14.3 gm/dL (ref 12.0–16.0)
Lymphocytes %: 27 % (ref 25–45)
Lymphocytes, Absolute: 1.4 10*3/uL (ref 1.1–4.3)
MCH: 31.1 pg (ref 27–34)
MCHC: 33.4 gm/dL (ref 32–36)
MCV: 93.3 fL (ref 81–99)
MPV: 10.3 fL (ref 7.4–10.4)
Monocytes %: 11 % (ref 0–12)
Monocytes, Absolute: 0.6 10*3/uL (ref 0–1.2)
Neutrophils, Absolute: 3.1 10*3/uL (ref 1.6–7.3)
Platelet Count: 230 10*3/uL (ref 150–400)
RBC: 4.6 10*6/uL (ref 4.20–5.40)
RDW: 13.4 % (ref 11.5–14.5)
Segs (Neutrophils)%: 56 % (ref 35–70)
WBC: 5.4 10*3/uL (ref 4.8–10.8)

## 2012-09-18 LAB — C-REACTIVE PROTEIN (HIGH SENSITIVITY): CRP, High Sensitivity: 0.1 mg/L (ref 0.00–3.00)

## 2012-09-18 LAB — T3, FREE: T3, Free: 2.8 pg/mL (ref 2.4–3.9)

## 2012-09-18 LAB — CORONARY RISK LIPID PANEL
Cholesterol (CRISK): 219 mg/dL — ABNORMAL HIGH (ref <200)
Cholesterol, HDL: 88 mg/dL — ABNORMAL HIGH (ref 35–80)
LDL Calculated: 120 mg/dL — ABNORMAL HIGH (ref <100)
Triglycerides (Crisk): 54 mg/dL (ref 40–210)

## 2012-09-18 LAB — 25-OH HYDROXY VITAMIN D, CALCIFEROL, TOTAL OF D2 & D3: Vitamin D, 25-OH Total: 52 ng/mL (ref 30–100)

## 2012-09-18 LAB — HOMOCYSTEINE, PLASMA: Homocysteine: 5.9 micromol/L (ref 0–13.0)

## 2012-09-18 LAB — TSH: TSH - Thyroid Stimulating Hormone: 0.64 microIU/mL (ref 0.40–5.80)

## 2012-09-18 LAB — PROGESTERONE: Progesterone: 5 ng/mL (ref 0.1–18.6)

## 2012-09-24 ENCOUNTER — Inpatient Hospital Stay: Payer: PRIVATE HEALTH INSURANCE | Attending: Family | Primary: MD

## 2012-09-24 DIAGNOSIS — N951 Menopausal and female climacteric states: Secondary | ICD-10-CM

## 2012-09-24 LAB — GLYCO-HEMOGLOBIN A1C
Estimated Average Glucose: 111 mg/dL
Hemoglobin A1C: 5.5 % (ref 4.3–6.1)

## 2012-09-24 LAB — PROGESTERONE: Progesterone: 1.1 ng/mL (ref 0.1–18.6)

## 2012-09-24 LAB — ESTRADIOL: Estradiol: 96 pg/mL

## 2013-06-24 ENCOUNTER — Encounter: Admit: 2013-06-24 | Discharge: 2013-06-24 | Attending: RN | Primary: MD

## 2013-06-26 ENCOUNTER — Encounter: Admit: 2013-06-26 | Discharge: 2013-06-26 | Attending: Family | Primary: MD

## 2013-10-21 ENCOUNTER — Other Ambulatory Visit: Admit: 2013-10-21 | Discharge: 2013-10-21 | Payer: PRIVATE HEALTH INSURANCE | Primary: MD

## 2013-10-21 DIAGNOSIS — N951 Menopausal and female climacteric states: Secondary | ICD-10-CM

## 2013-10-21 LAB — COMPREHENSIVE METABOLIC PANEL
ALT - Alanine Amino transferase: 24 IU/L (ref 10–45)
AST - Aspartate Aminotransferase: 23 IU/L (ref 10–42)
Albumin/Globulin Ratio: 1.1 (ref >0.9)
Albumin: 3.9 gm/dL (ref 3.5–5.0)
Alkaline Phosphatase: 36 IU/L — ABNORMAL LOW (ref 37–107)
Anion Gap: 8 mmol/L (ref 3–11)
BUN: 15 mg/dL (ref 8–21)
Bilirubin, Total: 0.4 mg/dL (ref 0.2–1.4)
CO2 - Carbon Dioxide: 26 mmol/L (ref 22–31)
Calcium: 9.6 mg/dL (ref 8.6–10.0)
Chloride: 104 mmol/L (ref 98–111)
Creatinine, Serum: 0.59 mg/dL (ref 0.44–1.03)
GFR Estimate: >60 mL/min/{1.73_m2} (ref >60)
Globulin: 3.4 gm/dL (ref 2.2–3.7)
Glucose: 90 mg/dL (ref 80–99)
Potassium: 3.8 mmol/L (ref 3.5–5.1)
Protein, Total: 7.3 gm/dL (ref 6.1–7.9)
Sodium: 138 mmol/L (ref 135–143)

## 2013-10-21 LAB — 25-OH HYDROXY VITAMIN D, CALCIFEROL, TOTAL OF D2 & D3: Vitamin D, 25-OH Total: 50 ng/mL (ref 30–100)

## 2013-10-21 LAB — GLYCO-HEMOGLOBIN A1C
Estimated Average Glucose: 114 mg/dL
Hemoglobin A1C: 5.6 % (ref 4.3–6.1)

## 2013-10-21 LAB — PROGESTERONE: Progesterone: 2.1 ng/mL (ref 0.1–18.6)

## 2013-10-21 LAB — ESTRADIOL: Estradiol: 114 pg/mL

## 2013-12-09 ENCOUNTER — Other Ambulatory Visit: Payer: Self-pay | Admitting: Obstetrics and Gynecology

## 2013-12-09 DIAGNOSIS — R928 Other abnormal and inconclusive findings on diagnostic imaging of breast: Secondary | ICD-10-CM

## 2013-12-20 ENCOUNTER — Ambulatory Visit
Admission: RE | Admit: 2013-12-20 | Discharge: 2013-12-20 | Disposition: A | Discharge status: Home / Self Care | Payer: BC Managed Care – PPO | Source: Ambulatory Visit | Attending: Obstetrics and Gynecology | Admitting: Obstetrics and Gynecology

## 2013-12-20 DIAGNOSIS — R928 Other abnormal and inconclusive findings on diagnostic imaging of breast: Secondary | ICD-10-CM

## 2014-03-01 ENCOUNTER — Encounter (INDEPENDENT_AMBULATORY_CARE_PROVIDER_SITE_OTHER): Payer: Self-pay | Admitting: *Deleted

## 2014-03-18 ENCOUNTER — Telehealth (INDEPENDENT_AMBULATORY_CARE_PROVIDER_SITE_OTHER): Payer: Self-pay | Admitting: *Deleted

## 2014-03-18 ENCOUNTER — Other Ambulatory Visit (INDEPENDENT_AMBULATORY_CARE_PROVIDER_SITE_OTHER): Payer: Self-pay | Admitting: *Deleted

## 2014-03-18 DIAGNOSIS — Z1211 Encounter for screening for malignant neoplasm of colon: Secondary | ICD-10-CM

## 2014-03-18 DIAGNOSIS — Z800 Family history of malignant neoplasm of digestive organs: Secondary | ICD-10-CM

## 2014-03-18 DIAGNOSIS — Z860100 Personal history of colon polyps, unspecified: Secondary | ICD-10-CM

## 2014-03-18 NOTE — Telephone Encounter (Signed)
Patient needs movi prep 

## 2014-03-21 DIAGNOSIS — M5430 Sciatica, unspecified side: Secondary | ICD-10-CM

## 2014-03-22 ENCOUNTER — Inpatient Hospital Stay: Admit: 2014-03-22 | Payer: PRIVATE HEALTH INSURANCE | Primary: MD

## 2014-03-23 MED ORDER — PEG-KCL-NACL-NASULF-NA ASC-C 100 G PO SOLR: 1.0000 | Freq: Once | ORAL | Status: DC

## 2014-04-21 ENCOUNTER — Telehealth (INDEPENDENT_AMBULATORY_CARE_PROVIDER_SITE_OTHER): Payer: Self-pay | Admitting: *Deleted

## 2014-04-21 NOTE — Telephone Encounter (Signed)
  Procedure: tcs  Reason/Indication:  Hx polyps, fam hx colon ca  Has patient had this procedure before?  Yes, 2008 -- scanned  If so, when, by whom and where?    Is there a family history of colon cancer?  Yes, aunt  Who?  What age when diagnosed?    Is patient diabetic?   no      Does patient have prosthetic heart valve?  no  Do you have a pacemaker?  no  Has patient ever had endocarditis? no  Has patient had joint replacement within last 12 months?  no  Does patient tend to be constipated or take laxatives? no  Is patient on Coumadin, Plavix and/or Aspirin? no  Medications: fluticasone 2 spray each nostril daily, nadolol 40/5 mg daily, simvastatin 40 mg daily, lansoprazole 30 mg daily, ventolin inhaler prn, centrum silver daily, fish oil daily, folic acid daily, vit I95 daily  Allergies: pcn, advil, aleve  Medication Adjustment:   Procedure date & time: 05/18/14 at 1200

## 2014-04-22 NOTE — Telephone Encounter (Signed)
agree

## 2014-04-28 ENCOUNTER — Encounter (INDEPENDENT_AMBULATORY_CARE_PROVIDER_SITE_OTHER): Payer: Self-pay | Admitting: *Deleted

## 2014-04-28 NOTE — Progress Notes (Signed)
This encounter was created in error - please disregard.

## 2014-05-04 ENCOUNTER — Encounter (HOSPITAL_COMMUNITY): Payer: Self-pay | Admitting: Pharmacy Technician

## 2014-05-18 ENCOUNTER — Ambulatory Visit (HOSPITAL_COMMUNITY)
Admission: RE | Admit: 2014-05-18 | Discharge: 2014-05-18 | Disposition: A | Discharge status: Home / Self Care | Payer: BC Managed Care – PPO | Source: Ambulatory Visit | Attending: Internal Medicine | Admitting: Internal Medicine

## 2014-05-18 ENCOUNTER — Encounter (HOSPITAL_COMMUNITY)
Admission: RE | Disposition: A | Discharge status: Home / Self Care | Payer: Self-pay | Source: Ambulatory Visit | Attending: Internal Medicine

## 2014-05-18 ENCOUNTER — Encounter (HOSPITAL_COMMUNITY): Payer: Self-pay | Admitting: *Deleted

## 2014-05-18 DIAGNOSIS — D129 Benign neoplasm of anus and anal canal: Secondary | ICD-10-CM | POA: Diagnosis not present

## 2014-05-18 DIAGNOSIS — D126 Benign neoplasm of colon, unspecified: Secondary | ICD-10-CM

## 2014-05-18 DIAGNOSIS — Z800 Family history of malignant neoplasm of digestive organs: Secondary | ICD-10-CM

## 2014-05-18 DIAGNOSIS — IMO0002 Reserved for concepts with insufficient information to code with codable children: Secondary | ICD-10-CM | POA: Diagnosis not present

## 2014-05-18 DIAGNOSIS — K644 Residual hemorrhoidal skin tags: Secondary | ICD-10-CM | POA: Diagnosis not present

## 2014-05-18 DIAGNOSIS — Z90710 Acquired absence of both cervix and uterus: Secondary | ICD-10-CM | POA: Diagnosis not present

## 2014-05-18 DIAGNOSIS — Z86010 Personal history of colonic polyps: Secondary | ICD-10-CM

## 2014-05-18 DIAGNOSIS — D128 Benign neoplasm of rectum: Secondary | ICD-10-CM | POA: Insufficient documentation

## 2014-05-18 HISTORY — PX: COLONOSCOPY: SHX5424

## 2014-05-18 SURGERY — COLONOSCOPY
Anesthesia: Moderate Sedation

## 2014-05-18 MED ORDER — MIDAZOLAM HCL 5 MG/5ML IJ SOLN
INTRAMUSCULAR | Status: DC | PRN
  Administered 2014-05-18 (×2): 2 mg via INTRAVENOUS
  Administered 2014-05-18: 1 mg via INTRAVENOUS

## 2014-05-18 MED ORDER — STERILE WATER FOR IRRIGATION IR SOLN
Status: DC | PRN
  Administered 2014-05-18: 11:00:00

## 2014-05-18 MED ORDER — MEPERIDINE HCL 50 MG/ML IJ SOLN
INTRAMUSCULAR | Status: DC | PRN
  Administered 2014-05-18 (×2): 25 mg via INTRAVENOUS

## 2014-05-18 MED ORDER — MEPERIDINE HCL 50 MG/ML IJ SOLN
INTRAMUSCULAR | Status: AC
  Filled 2014-05-18: qty 1

## 2014-05-18 MED ORDER — SODIUM CHLORIDE 0.9 % IV SOLN
INTRAVENOUS | Status: DC
  Administered 2014-05-18: 11:00:00 via INTRAVENOUS

## 2014-05-18 MED ORDER — MIDAZOLAM HCL 5 MG/5ML IJ SOLN
INTRAMUSCULAR | Status: DC
Start: 2014-05-18 — End: 2014-05-18
  Filled 2014-05-18: qty 10

## 2014-05-18 MED ORDER — NYSTATIN-TRIAMCINOLONE 100000-0.1 UNIT/GM-% EX CREA: 1.0000 "application " | TOPICAL_CREAM | Freq: Two times a day (BID) | CUTANEOUS | Status: DC

## 2014-05-18 NOTE — H&P (Signed)
Samantha Larsen is an 58 y.o. female.   Chief Complaint: Patient is here for colonoscopy. HPI: Patient is a 11-year-old Caucasian female who is here for surveillance colonoscopy. She had a rectal tubular adenoma snared in 2003. Her last exam was in July 2008 and was negative for polyps. She has history of constipation and is using MiraLax on an as-needed basis. She has been having problem with hemorrhoids in the form of discomfort in the bleeding. She has used suppositories given to her by her gynecologist which has helped. She denies frank rectal bleeding. Family history significant for colon carcinoma in paternal aunts who was in her 37s at the time of diagnosis. Her father has had colonic polyps removed.  History reviewed. No pertinent past medical history.  Past Surgical History  Procedure Laterality Date  . Abdominal hysterectomy    . Dilation and curettage of uterus      x 2  . Tubal ligation    . Lasik      Family History  Problem Relation Age of Onset  . Other Mother   . Dementia Father   . Heart murmur Father    Social History:  reports that she has never smoked. She has never used smokeless tobacco. She reports that she does not drink alcohol or use illicit drugs.  Allergies:  Allergies  Allergen Reactions  . Ibuprofen Swelling    Eyes swell   . Penicillins Hives    Medications Prior to Admission  Medication Sig Dispense Refill  . albuterol (PROVENTIL HFA;VENTOLIN HFA) 108 (90 BASE) MCG/ACT inhaler Inhale 1-2 puffs into the lungs every 6 (six) hours as needed for wheezing or shortness of breath.      . fluticasone (FLONASE) 50 MCG/ACT nasal spray Place 2 sprays into both nostrils daily.      . folic acid (FOLVITE) 1 MG tablet Take 1 mg by mouth daily.      . lansoprazole (PREVACID) 30 MG capsule Take 30 mg by mouth daily at 12 noon.      . Multiple Vitamins-Minerals (MULTIVITAMINS THER. W/MINERALS) TABS tablet Take 1 tablet by mouth daily.      .  nadolol-bendroflumethiazide (CORZIDE) 40-5 MG per tablet Take 1 tablet by mouth daily.      . Omega-3 Fatty Acids (FISH OIL PO) Take 1 capsule by mouth daily.      . simvastatin (ZOCOR) 40 MG tablet Take 40 mg by mouth daily.      . vitamin B-12 (CYANOCOBALAMIN) 1000 MCG tablet Take 1,000 mcg by mouth daily.        No results found for this or any previous visit (from the past 48 hour(s)). No results found.  ROS  Height 4' 11.5" (1.511 m), weight 215 lb (97.523 kg). Physical Exam  Constitutional: She appears well-developed and well-nourished.  HENT:  Mouth/Throat: Oropharynx is clear and moist.  Eyes: Conjunctivae are normal. No scleral icterus.  Neck: No thyromegaly present.  Cardiovascular: Normal rate, regular rhythm and normal heart sounds.   No murmur heard. Respiratory: Effort normal and breath sounds normal.  GI: Soft. She exhibits no distension and no mass. There is no tenderness.  Musculoskeletal: She exhibits no edema.  Lymphadenopathy:    She has no cervical adenopathy.  Neurological: She is alert.  Skin: Skin is warm and dry.     Assessment/Plan History of rectal adenoma. Family history of colon carcinoma in second degree relative. Surveillance colonoscopy.  Samantha Larsen U 05/18/2014, 11:21 AM

## 2014-05-18 NOTE — Discharge Instructions (Signed)
Resume usual medications and high fiber diet. Mycolog-II cream to be applied to anal canal twice daily for two weeks and then as needed. No driving for 24 hours. Physician will call with biopsy results.   High-Fiber Diet Fiber is found in fruits, vegetables, and grains. A high-fiber diet encourages the addition of more whole grains, legumes, fruits, and vegetables in your diet. The recommended amount of fiber for adult males is 38 g per day. For adult females, it is 25 g per day. Pregnant and lactating women should get 28 g of fiber per day. If you have a digestive or bowel problem, ask your caregiver for advice before adding high-fiber foods to your diet. Eat a variety of high-fiber foods instead of only a select few type of foods.  PURPOSE  To increase stool bulk.  To make bowel movements more regular to prevent constipation.  To lower cholesterol.  To prevent overeating. WHEN IS THIS DIET USED?  It may be used if you have constipation and hemorrhoids.  It may be used if you have uncomplicated diverticulosis (intestine condition) and irritable bowel syndrome.  It may be used if you need help with weight management.  It may be used if you want to add it to your diet as a protective measure against atherosclerosis, diabetes, and cancer. SOURCES OF FIBER  Whole-grain breads and cereals.  Fruits, such as apples, oranges, bananas, berries, prunes, and pears.  Vegetables, such as green peas, carrots, sweet potatoes, beets, broccoli, cabbage, spinach, and artichokes.  Legumes, such split peas, soy, lentils.  Almonds. FIBER CONTENT IN FOODS Starches and Grains / Dietary Fiber (g)  Cheerios, 1 cup / 3 g  Corn Flakes cereal, 1 cup / 0.7 g  Rice crispy treat cereal, 1 cup / 0.3 g  Instant oatmeal (cooked),  cup / 2 g  Frosted wheat cereal, 1 cup / 5.1 g  Brown, long-grain rice (cooked), 1 cup / 3.5 g  White, long-grain rice (cooked), 1 cup / 0.6 g  Enriched macaroni  (cooked), 1 cup / 2.5 g Legumes / Dietary Fiber (g)  Baked beans (canned, plain, or vegetarian),  cup / 5.2 g  Kidney beans (canned),  cup / 6.8 g  Pinto beans (cooked),  cup / 5.5 g Breads and Crackers / Dietary Fiber (g)  Plain or honey graham crackers, 2 squares / 0.7 g  Saltine crackers, 3 squares / 0.3 g  Plain, salted pretzels, 10 pieces / 1.8 g  Whole-wheat bread, 1 slice / 1.9 g  White bread, 1 slice / 0.7 g  Raisin bread, 1 slice / 1.2 g  Plain bagel, 3 oz / 2 g  Flour tortilla, 1 oz / 0.9 g  Corn tortilla, 1 small / 1.5 g  Hamburger or hotdog bun, 1 small / 0.9 g Fruits / Dietary Fiber (g)  Apple with skin, 1 medium / 4.4 g  Sweetened applesauce,  cup / 1.5 g  Banana,  medium / 1.5 g  Grapes, 10 grapes / 0.4 g  Orange, 1 small / 2.3 g  Raisin, 1.5 oz / 1.6 g  Melon, 1 cup / 1.4 g Vegetables / Dietary Fiber (g)  Green beans (canned),  cup / 1.3 g  Carrots (cooked),  cup / 2.3 g  Broccoli (cooked),  cup / 2.8 g  Peas (cooked),  cup / 4.4 g  Mashed potatoes,  cup / 1.6 g  Lettuce, 1 cup / 0.5 g  Corn (canned),  cup / 1.6 g  Tomato,  cup / 1.1 g Document Released: 08/12/2005 Document Revised: 02/11/2012 Document Reviewed: 11/14/2011 [facility] Patient Information 2015 [place], [place]. This information is not intended to replace advice given to you by your health care provider. Make sure you discuss any questions you have with your health care provider. Colonoscopy, Care After Refer to this sheet in the next few weeks. These instructions provide you with information on caring for yourself after your procedure. Your health care provider may also give you more specific instructions. Your treatment has been planned according to current medical practices, but problems sometimes occur. Call your health care provider if you have any problems or questions after your procedure. WHAT TO EXPECT AFTER THE PROCEDURE  After your procedure, it is  typical to have the following:  A small amount of blood in your stool.  Moderate amounts of gas and mild abdominal cramping or bloating. HOME CARE INSTRUCTIONS  Do not drive, operate machinery, or sign important documents for 24 hours.  You may shower and resume your regular physical activities, but move at a slower pace for the first 24 hours.  Take frequent rest periods for the first 24 hours.  Walk around or put a warm pack on your abdomen to help reduce abdominal cramping and bloating.  Drink enough fluids to keep your urine clear or pale yellow.  You may resume your normal diet as instructed by your health care provider. Avoid heavy or fried foods that are hard to digest.  Avoid drinking alcohol for 24 hours or as instructed by your health care provider.  Only take over-the-counter or prescription medicines as directed by your health care provider.  If a tissue sample (biopsy) was taken during your procedure:  Do not take aspirin or blood thinners for 7 days, or as instructed by your health care provider.  Do not drink alcohol for 7 days, or as instructed by your health care provider.  Eat soft foods for the first 24 hours. SEEK MEDICAL CARE IF: You have persistent spotting of blood in your stool 2-3 days after the procedure. SEEK IMMEDIATE MEDICAL CARE IF:  You have more than a small spotting of blood in your stool.  You pass large blood clots in your stool.  Your abdomen is swollen (distended).  You have nausea or vomiting.  You have a fever.  You have increasing abdominal pain that is not relieved with medicine. Document Released: 03/26/2004 Document Revised: 06/02/2013 Document Reviewed: 04/19/2013 [facility] Patient Information 2015 [place], [place]. This information is not intended to replace advice given to you by your health care provider. Make sure you discuss any questions you have with your health care provider.

## 2014-05-18 NOTE — Op Note (Signed)
COLONOSCOPY PROCEDURE REPORT  PATIENT:  Samantha Larsen  MR#:  [id number] Birthdate:  1955/11/25, 58 y.o., female Endoscopist:  Dr. Rogene Houston, MD Referred By:  Dr. Jasper Loser. Luan Pulling, MD  Procedure Date: 05/18/2014  Procedure:   Colonoscopy  Indications:  Patient is 58 year old Caucasian female with history of rectal tubular adenoma who is here for surveillance colonoscopy. Family history significant for colon carcinoma in paternal aunt who was in her 28s at the time of diagnosis. Her other has had colonic polyps removed.  Informed Consent:  The procedure and risks were reviewed with the patient and informed consent was obtained.  Medications:  Demerol 50 mg IV Versed 5 mg IV  Description of procedure:  After a digital rectal exam was performed, that colonoscope was advanced from the anus through the rectum and colon to the area of the cecum, ileocecal valve and appendiceal orifice. The cecum was deeply intubated. These structures were well-seen and photographed for the record. From the level of the cecum and ileocecal valve, the scope was slowly and cautiously withdrawn. The mucosal surfaces were carefully surveyed utilizing scope tip to flexion to facilitate fold flattening as needed. The scope was pulled down into the rectum where a thorough exam including retroflexion was performed.  Findings:   Prep excellent. Three small polyps were ablated via cold biopsy from the sigmoid colon and submitted together along with rectal polyp which was also ablated via cold biopsy. Normal rectal mucosa. Small hemorrhoids below the dentate line.    Therapeutic/Diagnostic Maneuvers Performed:  See above  Complications:  None  Cecal Withdrawal Time:  18 minutes  Impression:  Examination performed to cecum. Four small polyps were ablated via cold biopsy and submitted together(300 sigmoid colon and one at rectum). External hemorrhoids.  Recommendations:  Standard instructions  given. Mycolog-II cream to be applied to anal canal twice a day for two weeks and then on an as-needed basis the I will contact patient with biopsy results and further recommendations.  Spring San U  05/18/2014 12:06 PM  CC: Dr. Alonza Bogus, MD & Dr. Rayne Du ref. provider found

## 2014-05-20 ENCOUNTER — Encounter (HOSPITAL_COMMUNITY): Payer: Self-pay | Admitting: Internal Medicine

## 2014-06-01 ENCOUNTER — Inpatient Hospital Stay: Admit: 2014-06-01 | Payer: PRIVATE HEALTH INSURANCE | Attending: MD | Primary: MD

## 2014-06-01 DIAGNOSIS — M160 Bilateral primary osteoarthritis of hip: Secondary | ICD-10-CM

## 2014-06-21 ENCOUNTER — Encounter (INDEPENDENT_AMBULATORY_CARE_PROVIDER_SITE_OTHER): Payer: Self-pay | Admitting: *Deleted

## 2014-08-10 ENCOUNTER — Other Ambulatory Visit: Admit: 2014-08-10 | Discharge: 2014-08-10 | Payer: PRIVATE HEALTH INSURANCE | Primary: MD

## 2014-08-10 DIAGNOSIS — M25552 Pain in left hip: Secondary | ICD-10-CM

## 2014-08-10 LAB — CBC WITH AUTO DIFFERENTIAL
Basophils %: 1 % (ref 0–2)
Basophils, Absolute: 0 10*3/uL (ref 0–0.2)
Eosinophils %: 2 % (ref 0–7)
Eosinophils, Absolute: 0.1 10*3/uL (ref 0–0.7)
HCT: 45.4 % (ref 37.0–48.0)
Hgb: 15.2 gm/dL (ref 12.0–16.0)
Lymphocytes %: 24 % — ABNORMAL LOW (ref 25–45)
Lymphocytes, Absolute: 1.5 10*3/uL (ref 1.1–4.3)
MCH: 31.8 pg (ref 27–34)
MCHC: 33.6 gm/dL (ref 32–36)
MCV: 94.6 fL (ref 81–99)
MPV: 10.9 fL — ABNORMAL HIGH (ref 7.4–10.4)
Monocytes %: 7 % (ref 0–12)
Monocytes, Absolute: 0.5 10*3/uL (ref 0–1.2)
Neutrophils, Absolute: 4.3 10*3/uL (ref 1.6–7.3)
Platelet Count: 207 10*3/uL (ref 150–400)
RBC: 4.79 10*6/uL (ref 4.20–5.40)
RDW: 13.9 % (ref 11.5–14.5)
Segs (Neutrophils)%: 66 % (ref 35–70)
WBC: 6.4 10*3/uL (ref 4.8–10.8)

## 2014-08-10 LAB — GLYCO-HEMOGLOBIN A1C
Estimated Average Glucose: 111 mg/dL
Hemoglobin A1C: 5.5 % (ref 4.3–6.1)

## 2014-08-10 LAB — C-REACTIVE PROTEIN (HIGH SENSITIVITY): CRP, High Sensitivity: 0.26 mg/L (ref 0.00–3.00)

## 2014-08-10 LAB — TESTOSTERONE, TOTAL AND FREE, SERUM (MAYO): Testosterone, Total: 23 ng/dL (ref 8–60)

## 2014-08-10 LAB — FOLLICLE STIMULATING HORMONE: FSH: 40 m[IU]/mL

## 2014-08-10 LAB — PROGESTERONE: Progesterone: 5 ng/mL (ref 0.1–18.6)

## 2014-08-10 LAB — ESTRADIOL: Estradiol: 126 pg/mL

## 2014-08-10 LAB — DHEA-SULFATE PANEL: DHEA-Sulfate Panel: 131 microgm/dL (ref 8–188)

## 2014-08-10 LAB — ERYTHROCYTE SEDIMENTATION RATE, AUTOMATED: Sed Rate: 2 mm/hr (ref 0–20)

## 2014-09-14 ENCOUNTER — Other Ambulatory Visit: Admit: 2014-09-14 | Discharge: 2014-09-14 | Payer: PRIVATE HEALTH INSURANCE | Primary: MD

## 2014-09-14 DIAGNOSIS — M8708 Idiopathic aseptic necrosis of bone, other site: Secondary | ICD-10-CM

## 2014-09-14 LAB — PT & PTT
APTT: 30 s (ref 23–34)
INR: 1 (ref 0.9–1.2)
Protime: 10.7 s (ref 10.0–13.0)

## 2014-09-14 LAB — BASIC METABOLIC PANEL
Anion Gap: 8.1 mmol/L (ref 3–11)
BUN: 15 mg/dL (ref 6.0–23.0)
CO2 - Carbon Dioxide: 23.9 mmol/L (ref 21.0–31.0)
Calcium: 9.2 mg/dL (ref 8.6–10.3)
Chloride: 105 mmol/L (ref 98–111)
Creatinine, Serum: 0.54 mg/dL — ABNORMAL LOW (ref 0.55–1.10)
GFR Estimate: >60 mL/min/{1.73_m2} (ref >60)
Glucose: 86 mg/dL (ref 80–99)
Potassium: 3.9 mmol/L (ref 3.5–5.1)
Sodium: 137 mmol/L (ref 135–143)

## 2014-09-14 LAB — MRSA BY PCR: MRSA DNA Result: NEGATIVE

## 2014-09-19 ENCOUNTER — Encounter: Admit: 2014-09-19 | Discharge: 2014-09-19 | Attending: Family | Primary: MD

## 2014-09-26 ENCOUNTER — Other Ambulatory Visit: Admit: 2014-09-27 | Discharge: 2014-09-27 | Payer: PRIVATE HEALTH INSURANCE | Primary: MD

## 2014-09-26 DIAGNOSIS — Z8614 Personal history of Methicillin resistant Staphylococcus aureus infection: Secondary | ICD-10-CM

## 2014-09-27 LAB — MRSA BY PCR: MRSA DNA Result: NEGATIVE

## 2014-09-29 ENCOUNTER — Encounter: Primary: MD

## 2014-10-19 ENCOUNTER — Institutional Professional Consult (permissible substitution): Admit: 2014-10-19 | Payer: PRIVATE HEALTH INSURANCE | Attending: PT | Primary: MD

## 2014-10-19 DIAGNOSIS — M1612 Unilateral primary osteoarthritis, left hip: Secondary | ICD-10-CM

## 2014-10-19 NOTE — Plan of Care (Signed)
[facility]  [facility]  [address]  [place] [address]  Dept: [phone]  Fax: [phone]  Loc: [phone]    Physical Therapy Plan of Care    Patient Name: Nancy Richard Referring Provider: Delfina Redwood, M.D.   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  At that time, she was feeling severe tearing sensations (like a taser) in her groin and anteror hip area.  She also mentions that her left hip would intermittently give way.  On 10/03/2014, she had left hip resurfacing surgery and post op experienced frequent and severe spasms starting in her foot and moving upwards to the left hip with any light touch of her leg.  These spasms continue, but frequency has decreased.     PT Initial Problem List       10/19/2014    1. Presence of pain:  Max 9/10, best 0/10.    2. Disturbed sleep:  Awakens after 1 -3 hours of sleep.  Has difficulty getting comfortable to fall asleep.    3. Unable to sit for minimal periods of time (max 2-3 minutes).    4. Decreased A/PROM left hip:  AROM flexion 48, extension neutral, abduction 20, ER 5 degrees.    5. Impaired strength left LE:  1+/5 to 2-/5 throughout.    6. Inability to ambulate without assistive device.         PT Treatment Goals  Short Term Goals (# weeks: 4) STG Outcomes   STG 1: Reduce pain level to max 4/10. Outcome 1: New    STG 2: Improve sleep:  Will be  undisturbed by pain for at least 4 hours. Outcome 2: New    STG 3: Will tolerate at least 30 minutes of sitting to eat a meal. Outcome 3: New    Long Term Goals (# weeks: 6) LTG Outcomes    LTG 1: Increase AROM:  Hip flexion to 85, extension to 10, abduction to 30 and ER to 20 degrees. Outcome 1: New    LTG 2: Increase strength left LE by 1/2 muscle grade or better.  Outcome 2: New    LTG 3: Tametria will be able to ambulate without assistive device on levels and negotiate stairs with use of handrail.  Outcome 3: New      Justification for Continued Care: N/A: 1st Visit    Rehab Potential: Good  Treatment Interventions:   Gait Training 13244  Mobilization/Manual Therapy 97140  NM Re-Ed/Balance [id number]  Ther. Ex/Home Program [phone]  Therapeutic Activities [phone]  Elec. Stim- unattended [phone]  HP/CP/Ice [id number]  Ultrasound [id number]  Aquatic Therapy [id number]  Core Stabilization Program 97110  Postural Training/ Body Mechanics 97110    Precautions: Total Hip Precautions: Posterior   Comments: Diagnosis is consistent with findings of swollen left hip, antalgic gait/movement, decreased AROM and impaired left LE strength.     Expected  Frequency/Duration of Therapy:  2 x/week(s) for 6 weeks, or sooner if discharge criteria are met.    ________________________________________________________________________    Thank you for this referral.    Irven Shelling, PT  10/19/2014    Please sign below if you agree with the provided plan and return to the fax number listed above. Modifications/precautions are appreciated.     I certify the need for these services furnished under this plan of treatment and while under my care.    Provider Signature: ________________________________________________________      Date/Time: __________________________________________________________________    (Patient Name: Nancy Richard)

## 2014-10-19 NOTE — Treatment Summary (Signed)
PT Daily Note    Patient name: Nancy Richard Visits from [facility]: 1 / # Auth Visits approved: 12    Date of Birth: 1956/07/04 Date: 10/19/2014   Start Time: 1300 Stop Time: 1400   Benefit Limit (date): 08/26/15  Benefit Limit (total visits): 60    No Data Recorded     No Data Recorded     Precautions: Total Hip Precautions: Posterior     SUBJECTIVE:   Pain Level: Pain Level  Pain at rest: 0 - No Pain  Pain with activity: 9  Pain Types: Surgical Pain  Pain Location: Hip, Leg  Pain Descriptors: Aching, Cramping, Headache, Spasm, Throbbing, Other (Comment) (Stiff)    OBJECTIVE:   See Evaluation (may have been scanned into Media tab).     Treatment:    ? Evaluation performed.    Patient/Caregiver goals reviewed and integrated with rehab treatment plan.    ASSESSMENT:   See Plan of Care for detailed problem list.    Patient demonstrates limitations which require skilled PT Intervention to achieve the following goals:    PT Treatment Goals  # weeks: 4    STG 1: Reduce pain level to max 4/10. Outcome 1: New    STG 2: Improve sleep:  Will be undisturbed by pain for at least 4 hours. Outcome 2: New    STG 3: Will tolerate at least 30 minutes of sitting to eat a meal. Outcome 3: New    No Data Recorded No Data Recorded    No Data Recorded  No Data Recorded   # weeks: 6     LTG 1: Increase AROM:  Hip flexion to 85, extension to 10, abduction to 30 and ER to 20 degrees. Outcome 1: New    LTG 2: Increase strength left LE by 1/2 muscle grade or better.  Outcome 2: New    LTG 3: [name] will be able to ambulate without assistive device on levels and negotiate stairs with use of handrail.  Outcome 3: New    No Data Recorded  No Data Recorded    No Data Recorded  No Data Recorded      Patient/Caregiver goals reviewed and integrated with rehab treatment plan.    PLAN:   Continue PT per established plan of care. This note to serve as a Discharge Summary if Nancy Richard is discharged from therapy services.      Irven Shelling,  PT  (Billable Time: 60)

## 2014-10-21 ENCOUNTER — Institutional Professional Consult (permissible substitution): Admit: 2014-10-21 | Payer: PRIVATE HEALTH INSURANCE | Attending: PT | Primary: MD

## 2014-10-21 DIAGNOSIS — M1612 Unilateral primary osteoarthritis, left hip: Secondary | ICD-10-CM

## 2014-10-21 NOTE — Treatment Summary (Addendum)
PT Daily Note    Patient name: Nancy Richard Visits from [facility]: 09/27/10    Date of Birth: [birth year] Date: 10/21/2014   Start Time: 1100 Stop Time: 1200        Primary/Referral Dx: Left Hip Arthritis (s/p Left Hip Resurfacing 10/03/2014)  Treatment Dx: [name] Disturbance;Muscle Weakness;Muscle Spasm;Pain in-Hip;Pain in joint-Hip;Stiffness joint-Hip     Next Note due to Physician: 11/17/14    Precautions:   Total Hip Precautions: Posterior     SUBJECTIVE:  Pain became really severe riding into therapy (9/10) - happens trying to keep the leg and knee out    Pain Level: Pain Level  Pain Score at start of session: 9    OBJECTIVE:   Amb with FWW, decreased hip and knee flex  Tends to stand with minimal WB LLE, pelvis rotated posteriorly on the left  Hip flexion supine increased from 50 to 80 with towel and cues    Treatment:   Therapeutic activities:  Car transfers, supine to sit, dressing - all with review of hip precautions    Therapeutic exercises:  Given handout of exercises (from [employer]); supine hip flexion with towel and instruct in heel slides with leg on pillow; supine hip abduction; standing weight shift, hip abduction, extension and flexion    ASSESSMENT:   Unable to fully WB LLE without pain or HHA; severe pain one time supine to sit  Responds well to cues for all ex; apprehensive so needs encouragement  Encouraged to remove pillow some of the time and not keep hip flexed all the time   Patient continues to demonstrate limitations which require skilled PT Intervention to achieve the following goals:     PT Treatment Goals  # weeks: 4    STG 1: Reduce pain level to max 4/10. Outcome 1: New    STG 2: Improve sleep:  Will be undisturbed by pain for at least 4 hours. Outcome 2: New    STG 3: Will tolerate at least 30 minutes of sitting to eat a meal. Outcome 3: New    No Data Recorded No Data Recorded    No Data Recorded  No Data Recorded   # weeks: 6     LTG 1: Increase AROM:  Hip flexion to 85,  extension to 10, abduction to 30 and ER to 20 degrees. Outcome 1: New    LTG 2: Increase strength left LE by 1/2 muscle grade or better.  Outcome 2: New    LTG 3: Kesiah will be able to ambulate without assistive device on levels and negotiate stairs with use of handrail.  Outcome 3: New    No Data Recorded  No Data Recorded    No Data Recorded  No Data Recorded      Patient/Caregiver goals reviewed and integrated with rehab treatment plan.    PLAN:   Progress HEP, improve gait.   Continue PT per established plan of care. This note to serve as a Discharge Summary if Nancy Richard is discharged from therapy services.    Davontay Watlington L. KOTT-SOPER, PT  (Billable Time: 60)

## 2014-10-26 ENCOUNTER — Institutional Professional Consult (permissible substitution): Admit: 2014-10-26 | Payer: PRIVATE HEALTH INSURANCE | Attending: PT | Primary: MD

## 2014-10-26 DIAGNOSIS — M1612 Unilateral primary osteoarthritis, left hip: Secondary | ICD-10-CM

## 2014-10-26 NOTE — Treatment Summary (Signed)
PT Daily Note    Patient name: Nancy Richard Visits from [facility]: 10/27/10    Date of Birth: January 28, 1956 Date: 10/26/2014   Start Time: 0745 Stop Time: 0840        Primary/Referral Dx: Left Hip Arthritis (s/p Left Hip Resurfacing 10/03/2014)  Treatment Dx: [name] Disturbance;Muscle Weakness;Muscle Spasm;Pain in-Hip;Pain in joint-Hip;Stiffness joint-Hip     Next Note due to Physician: 11/17/14    Precautions:   Total Hip Precautions: Posterior     SUBJECTIVE:  Pain becomes really severe when riding in car (this morning coming into therapy pain reached a 9/10) or with sitting, despite using elevated seats.  Cannot tolerate pressure on gluteal region.  Pain also increases to a 9/10 with attempting to ER LE beyond neutral.    Continues to have spasms starting in her inner thigh area and then progressing to surgical site, but they are not as frequent.  Sleep is disturbed.    Pain Level: Pain Level  Pain Score at start of session: 1  Pain Score at end of session: 0 - No Pain  Pain with activity: 9    OBJECTIVE:     Gait:  Ambulates with FWW, decreased hip and knee flex, decreased stance time on left LE.  Posture:  Tends to stand with minimal WB LLE, pelvis rotated posteriorly on the left and hip IR.  Palpation:  Pectineus/short adductors ropey and tight.      Treatment:    ? Manual therapy:  STM hip adductors, gluteus maximus/piriformis and medial hamstrings followed by PROM hip flexion and hip ER in supine (ER with knee extended and with knee flexed), passive gastrocsoleus stretch in supine.  ? Therapeutic exercises:  Standing in parallel bars hip extension and abduction x 10-15 each leg, standing gluteal isometric contractions 10-count hold x 10, standing double limb heel raises x 20.  ? Gait training:  With FWW on level surface with focus on increasing stance time on left LE, decreasing pelvic posterior rotation on the left and increasing hip and knee flexion during swing phase.     ASSESSMENT:   Tolerated increased weight  bearing on left LE today.     Patient continues to demonstrate limitations which require skilled PT Intervention to achieve the following goals:     PT Treatment Goals  # weeks: 4    STG 1: Reduce pain level to max 4/10. Outcome 1: Continued    STG 2: Improve sleep:  Will be undisturbed by pain for at least 4 hours. Outcome 2: Continued    STG 3: Will tolerate at least 30 minutes of sitting to eat a meal. Outcome 3: Continued    No Data Recorded No Data Recorded    No Data Recorded  No Data Recorded   # weeks: 6     LTG 1: Increase AROM:  Hip flexion to 85, extension to 10, abduction to 30 and ER to 20 degrees. Outcome 1: Continued    LTG 2: Increase strength left LE by 1/2 muscle grade or better.  Outcome 2: Continued    LTG 3: [name] will be able to ambulate without assistive device on levels and negotiate stairs with use of handrail.  Outcome 3: Continued    No Data Recorded  No Data Recorded    No Data Recorded  No Data Recorded      Patient/Caregiver goals reviewed and integrated with rehab treatment plan.    PLAN:   Progress HEP, improve gait and strength.   Continue  PT per established plan of care. This note to serve as a Discharge Summary if [name] is discharged from therapy services.    Irven Shelling, PT  (Billable Time: 58)

## 2014-10-28 ENCOUNTER — Institutional Professional Consult (permissible substitution): Admit: 2014-10-28 | Payer: PRIVATE HEALTH INSURANCE | Attending: PT | Primary: MD

## 2014-10-28 DIAGNOSIS — M1612 Unilateral primary osteoarthritis, left hip: Secondary | ICD-10-CM

## 2014-10-28 NOTE — Treatment Summary (Signed)
PT Daily Note    Patient name: Simar Antrim Visits from [facility]: 11/28/10    Date of Birth: [birth year] Date: 10/28/2014   Start Time: 0815 Stop Time: 0915        Primary/Referral Dx: Left Hip Arthritis (s/p Left Hip Resurfacing 10/03/2014)  Treatment Dx: [name] Disturbance;Muscle Weakness;Muscle Spasm;Pain in-Hip;Pain in joint-Hip;Stiffness joint-Hip     Next Note due to Physician: 11/17/14    Precautions:   Total Hip Precautions: Posterior     SUBJECTIVE:  Patient continues to have left lower extremity muscle spasms, sometimes severe at times, worse at night.    Pain Level: Pain Level  Current Status: Pain significantly limits participation in therapy  Pain Score at start of session: 2 (8/10 with ambulation)  Pain Score at end of session: 1    OBJECTIVE:   In clinic without assistive device.  Tenderness in left buttock, quads and inner thigh muscles.    Treatment:   Therapeutic Exercise: Manual resisted ER/IR in supine with knee flexed, 15x; Sitting ER stretch, 10x; NK ext, 2.5# x20x1, extension, 2.5 x 20 x 1; SAQ, 3x20x1; supine hip abduction with slide board, 20x.    Therapeutic Activities: Practice weight shifting with FWW    Manual Therapy: STW to left hip adductor muscles    Gait Training: Practiced with FWW to facilitate gait with normal pattern and decreased limp.    Modalities: None    ASSESSMENT:   Tolerated NK exercises without pain. Advised to use FWW when in community to facilitate limp free gait.  Patient continues to demonstrate limitations which require skilled PT Intervention to achieve the following goals:     PT Treatment Goals  # weeks: 4    STG 1: Reduce pain level to max 4/10. Outcome 1: Continued    STG 2: Improve sleep:  Will be undisturbed by pain for at least 4 hours. Outcome 2: Continued    STG 3: Will tolerate at least 30 minutes of sitting to eat a meal. Outcome 3: Continued    No Data Recorded No Data Recorded    No Data Recorded  No Data Recorded   # weeks: 6     LTG 1: Increase AROM:   Hip flexion to 85, extension to 10, abduction to 30 and ER to 20 degrees. Outcome 1: Continued    LTG 2: Increase strength left LE by 1/2 muscle grade or better.  Outcome 2: Continued    LTG 3: Hemi will be able to ambulate without assistive device on levels and negotiate stairs with use of handrail.  Outcome 3: Continued    No Data Recorded  No Data Recorded    No Data Recorded  No Data Recorded      PLAN:   Continue for strength, gait and activity tolerance.   Continue PT per established plan of care. This note to serve as a Discharge Summary if Billye Dia is discharged from therapy services.    Chayden Garrelts C. Constanza Mincy, PT  (Billable Time: 60)

## 2014-10-31 ENCOUNTER — Institutional Professional Consult (permissible substitution): Admit: 2014-10-31 | Payer: PRIVATE HEALTH INSURANCE | Attending: PT | Primary: MD

## 2014-10-31 DIAGNOSIS — M1612 Unilateral primary osteoarthritis, left hip: Secondary | ICD-10-CM

## 2014-10-31 NOTE — Treatment Summary (Signed)
PT Daily Note    Patient name: Nancy Richard Visits from [facility]: 12/29/10    Date of Birth: [birth year] Date: 10/31/2014   Start Time: 0755 Stop Time: 0840        Primary/Referral Dx: Left Hip Arthritis (s/p Left Hip Resurfacing 10/03/2014)  Treatment Dx: [name] Disturbance;Muscle Weakness;Muscle Spasm;Pain in-Hip;Pain in joint-Hip;Stiffness joint-Hip     Next Note due to Physician: 11/17/14    Precautions:   Total Hip Precautions: Posterior     SUBJECTIVE:    Feeling more hopeful and is sleeping better.    Pain Level: Pain Level  Pain Score at start of session: 0 - No Pain  Pain Score at end of session: 0 - No Pain    Pain with riding in car and traveling over uneven ground this morning:  5/10.    OBJECTIVE:     Ambulates with FWW into clinic.    Tenderness in left buttock, quads and inner thigh muscles.    PROM:  Hip flexion 70?, abduction 20?, ER 20?     Treatment:    ? Therapeutic Exercise: PROM hip flexion, abduction and ER; manual resisted ER/IR in supine with knee flexed 15 x; bridging x 20; core stabilization with hip ER/IR to neutral x 10, standing hip extension x 10 each leg, double limb heel raises x 10, single limb heel raises x 10 each leg.  ? Manual Therapy: STM to left hip adductor and hamstring muscles.  ? Gait Training: Practiced in parallel bars and then with FWW to facilitate gait with normal pattern and decreased limp.    ASSESSMENT:   Tolerated all therapy well without onset of myospasms.    Patient continues to demonstrate limitations which require skilled PT Intervention to achieve the following goals:     PT Treatment Goals  # weeks: 4    STG 1: Reduce pain level to max 4/10. Outcome 1: Continued    STG 2: Improve sleep:  Will be undisturbed by pain for at least 4 hours. Outcome 2: Continued    STG 3: Will tolerate at least 30 minutes of sitting to eat a meal. Outcome 3: Continued    No Data Recorded No Data Recorded    No Data Recorded  No Data Recorded   # weeks: 6     LTG 1: Increase AROM:   Hip flexion to 85, extension to 10, abduction to 30 and ER to 20 degrees. Outcome 1: Continued    LTG 2: Increase strength left LE by 1/2 muscle grade or better.  Outcome 2: Continued    LTG 3: Nancy Richard will be able to ambulate without assistive device on levels and negotiate stairs with use of handrail.  Outcome 3: Continued    No Data Recorded  No Data Recorded    No Data Recorded  No Data Recorded      PLAN:   Continue for strength, gait and activity tolerance.   Continue PT per established plan of care. This note to serve as a Discharge Summary if Nancy Richard is discharged from therapy services.    Irven Shelling, PT  (Billable Time: 45)

## 2014-11-02 ENCOUNTER — Institutional Professional Consult (permissible substitution): Admit: 2014-11-02 | Payer: PRIVATE HEALTH INSURANCE | Attending: PT | Primary: MD

## 2014-11-02 DIAGNOSIS — M1612 Unilateral primary osteoarthritis, left hip: Secondary | ICD-10-CM

## 2014-11-02 NOTE — Treatment Summary (Signed)
PT Daily Note    Patient name: Aleja Phifer Visits from [facility]: 01/30/11    Date of Birth: 06-Feb-1956 Date: 11/02/2014   Start Time: 0850 Stop Time: 0945        Primary/Referral Dx: Left Hip Arthritis (s/p Left Hip Resurfacing 10/03/2014)  Treatment Dx: [name] Disturbance;Muscle Weakness;Muscle Spasm;Pain in-Hip;Pain in joint-Hip;Stiffness joint-Hip     Next Note due to Physician: 11/17/14    Precautions:   Total Hip Precautions: Posterior     SUBJECTIVE:    Felt better on Sunday, but not as good on Monday or yesterday.    Pain Level: Pain Level  Pain Score at start of session: 0 - No Pain  Pain Score at end of session: 0 - No Pain     1/10 pain with walking after therapy.  States I feel pretty good!     Pain with riding in car this morning:  5/10.    OBJECTIVE:     Ambulates with FWW into clinic.  Left side of pelvis posteriorly rotates during midstance and preswing.    PROM:  Hip flexion 80?, abduction 20?, ER 20?     Treatment:    ? Therapeutic Exercise: PROM hip flexion, abduction and ER; manual resisted ER in supine and sitting with knee flexed 15 x; bridging x 10; standing hip flexion x 15 each leg, standing hip abduction x 15 each leg, standing with hip extended and performing active knee extension/gluteal isometrics x 20, standing knee flexion x 15, weight shifting side to side.  Worked on alignment throughout therapy.  ? Manual Therapy: STM to left hip adductor muscles.  ? Gait Training: Instructed how to measure a single point cane to correct height; practiced gait in parallel bars holding one railing and then gait with single point cane forwards and backwards ambulation with focus on weight shifting and normalizing pattern and decreasing limp.    ASSESSMENT:   Cleared to use a single point cane on level surfaces.  To continue using FWW outdoors or when ambulating in the community.    Patient continues to demonstrate limitations which require skilled PT Intervention to achieve the following goals:     PT  Treatment Goals  # weeks: 4    STG 1: Reduce pain level to max 4/10. Outcome 1: Continued    STG 2: Improve sleep:  Will be undisturbed by pain for at least 4 hours. Outcome 2: Continued    STG 3: Will tolerate at least 30 minutes of sitting to eat a meal. Outcome 3: Continued    No Data Recorded No Data Recorded    No Data Recorded  No Data Recorded   # weeks: 6     LTG 1: Increase AROM:  Hip flexion to 85, extension to 10, abduction to 30 and ER to 20 degrees. Outcome 1: Continued    LTG 2: Increase strength left LE by 1/2 muscle grade or better.  Outcome 2: Continued    LTG 3: Priscille will be able to ambulate without assistive device on levels and negotiate stairs with use of handrail.  Outcome 3: Continued    No Data Recorded  No Data Recorded    No Data Recorded  No Data Recorded      PLAN:   Continue for strength, gait and activity tolerance.   Continue PT per established plan of care. This note to serve as a Discharge Summary if Keicha Pantaleo is discharged from therapy services.    Irven Shelling, PT  (  Billable Time: 55)

## 2014-11-07 ENCOUNTER — Institutional Professional Consult (permissible substitution): Payer: PRIVATE HEALTH INSURANCE | Attending: PT | Primary: MD

## 2014-11-07 NOTE — Treatment Summary (Signed)
PT Daily Note    Patient name: Syndey Merendino Visits from [facility]: 01/30/11    Date of Birth: [birth year] Date: 11/07/2014   Start Time: 0745 Stop Time: 0840        Primary/Referral Dx: Left Hip Arthritis (s/p Left Hip Resurfacing 10/03/2014)  Treatment Dx: [name] Disturbance;Muscle Weakness;Muscle Spasm;Pain in-Hip;Pain in joint-Hip;Stiffness joint-Hip     Next Note due to Physician: 11/17/14    Precautions:   Total Hip Precautions: Posterior     SUBJECTIVE:    Getting better.  Can bring knee away from center now.  Saturday and Sunday were pretty good.  Leg is tensing in the morning (rated as 5/10), but has not had any spasms lately.  States, My leg is unwinding.    Pain Level: Pain Level  Pain Score at start of session: 0 - No Pain  Pain Score at end of session: 1     Pain with riding in car this morning:  2/10.    OBJECTIVE:     Ambulates with single point cane into clinic.  Left side of pelvis posteriorly rotates during midstance and preswing.    Treatment:    ? Therapeutic Exercise: PROM hip flexion, abduction and ER x 10 each; AAROM hip abduction/adduction to neutral x 10, AROM hip ER in hook lying x 10, bridging x 15, step ups/step downs x 20 each, lateral step ups x 10, weight shifting side to side while maintaining neutral hip alignment, mini squats holding onto parallel bars x 20, standing knee flexion x 20.   ? Manual Therapy: STM to left hip adductor and piriformis muscles, modified strain/counterstrain position in supine for left piriformis.  ? Modality:  Brief application of moist hot pack to hip adductors prior to STM; declined cold pack post exercise, indicating she would do this upon return home today.    ASSESSMENT:     Overall, condition and gait pattern are improving.      Patient continues to demonstrate limitations which require skilled PT Intervention to achieve the following goals:     PT Treatment Goals  # weeks: 4    STG 1: Reduce pain level to max 4/10. Outcome 1: Continued    STG 2: Improve  sleep:  Will be undisturbed by pain for at least 4 hours. Outcome 2: Continued    STG 3: Will tolerate at least 30 minutes of sitting to eat a meal. Outcome 3: Continued    No Data Recorded No Data Recorded    No Data Recorded  No Data Recorded   # weeks: 6     LTG 1: Increase AROM:  Hip flexion to 85, extension to 10, abduction to 30 and ER to 20 degrees. Outcome 1: Continued    LTG 2: Increase strength left LE by 1/2 muscle grade or better.  Outcome 2: Continued    LTG 3: Peggi will be able to ambulate without assistive device on levels and negotiate stairs with use of handrail.  Outcome 3: Continued    No Data Recorded  No Data Recorded    No Data Recorded  No Data Recorded      PLAN:     Continue for strength, ROM, gait and activity tolerance.     Continue PT per established plan of care. This note to serve as a Discharge Summary if Kashauna Swanick is discharged from therapy services.    Irven Shelling, PT  (Billable Time: 54)

## 2014-11-09 ENCOUNTER — Institutional Professional Consult (permissible substitution): Admit: 2014-11-09 | Payer: PRIVATE HEALTH INSURANCE | Attending: PT | Primary: MD

## 2014-11-09 DIAGNOSIS — M1612 Unilateral primary osteoarthritis, left hip: Secondary | ICD-10-CM

## 2014-11-09 NOTE — Treatment Summary (Signed)
PT Daily Note    Patient name: Nancy Richard Visits from [facility]: 04/03/11    Date of Birth: 07-09-1956 Date: 11/09/2014   Start Time: 0905 Stop Time: 1010        Primary/Referral Dx: Left Hip Arthritis (s/p Left Hip Resurfacing 10/03/2014)  Treatment Dx: Nancy Richard Disturbance;Muscle Weakness;Muscle Spasm;Pain in-Hip;Pain in joint-Hip;Stiffness joint-Hip     Next Note due to Physician: 11/17/14    Precautions:   Total Hip Precautions: Posterior     SUBJECTIVE:  Getting better.  My leg is unwinding and better able to externally rotate, but knee is collapsing in.  Up all day but sits frequently; guessing 1-1/2 hrs has been maximum up at one time but severe pain later at night.  Generally feels best first thing in am after tylenol.  Better riding in car.  Hasn't had spasms in over week but hasn't driven.  Ice makes it more painful and tight.  Plans to get cold laser treatment for pain and healing    Pain Level: Pain Level  Pain Score at start of session: 1     OBJECTIVE:   Ambulates with single point cane into clinic.     Treatment:    -- Modality:  Brief application of moist hot pack to hip adductors prior to STM    -- Manual Therapy: STM to left hip adductor muscles and passive abduction; scar massage to left hip in RSL with instruct for self treatment    --Therapeutic Exercise: MR hip flexion-ext, abd-adduction and ER/IR x 10 each; AAROM hip abduction/adduction to neutral x 10, 4 step ups/step downs x 20 each, lateral step ups x 10; reviewed squat position     ASSESSMENT:  Overall, condition and gait pattern are improving.    Tends to IR with increase valgus angle with hip extension left; corrects with cues.    Patient continues to demonstrate limitations which require skilled PT Intervention to achieve the following goals:     PT Treatment Goals  # weeks: 4    STG 1: Reduce pain level to max 4/10. Outcome 1: Continued    STG 2: Improve sleep:  Will be undisturbed by pain for at least 4 hours. Outcome 2: Continued       STG 3: Will tolerate at least 30 minutes of sitting to eat a meal. Outcome 3: Continued    No Data Recorded No Data Recorded    No Data Recorded  No Data Recorded   # weeks: 6     LTG 1: Increase AROM:  Hip flexion to 85, extension to 10, abduction to 30 and ER to 20 degrees. Outcome 1: Continued    LTG 2: Increase strength left LE by 1/2 muscle grade or better.  Outcome 2: Continued    LTG 3: Nancy Richard will be able to ambulate without assistive device on levels and negotiate stairs with use of handrail.  Outcome 3: Continued    No Data Recorded  No Data Recorded    No Data Recorded  No Data Recorded      PLAN:   Continue for strength, ROM, gait and activity tolerance.  Encouraged pt to increase ambulatory time at home in preparation for RTW  Continue PT per established plan of care. This note to serve as a Discharge Summary if Nancy Richard is discharged from therapy services.    Eliab Closson L. KOTT-SOPER, PT  (Billable Time: 55)

## 2014-11-15 ENCOUNTER — Ambulatory Visit: Admit: 2014-11-15 | Discharge: 2014-11-15 | Payer: PRIVATE HEALTH INSURANCE | Primary: MD

## 2014-11-15 DIAGNOSIS — M169 Osteoarthritis of hip, unspecified: Secondary | ICD-10-CM

## 2014-11-15 LAB — CBC WITH AUTO DIFFERENTIAL
Basophils %: 1 % (ref 0–2)
Basophils, Absolute: 0 10*3/uL (ref 0–0.2)
Eosinophils %: 3 % (ref 0–7)
Eosinophils, Absolute: 0.2 10*3/uL (ref 0–0.7)
HCT: 38.7 % (ref 37.0–48.0)
Hgb: 12.6 gm/dL (ref 12.0–16.0)
Lymphocytes %: 25 % (ref 25–45)
Lymphocytes, Absolute: 1.7 10*3/uL (ref 1.1–4.3)
MCH: 31.2 pg (ref 27–34)
MCHC: 32.7 gm/dL (ref 32–36)
MCV: 95.4 fL (ref 81–99)
MPV: 10.1 fL (ref 7.4–10.4)
Monocytes %: 10 % (ref 0–12)
Monocytes, Absolute: 0.7 10*3/uL (ref 0–1.2)
Neutrophils, Absolute: 4.1 10*3/uL (ref 1.6–7.3)
Platelet Count: 226 10*3/uL (ref 150–400)
RBC: 4.06 10*6/uL — ABNORMAL LOW (ref 4.20–5.40)
RDW: 13.7 % (ref 11.5–14.5)
Segs (Neutrophils)%: 61 % (ref 35–70)
WBC: 6.6 10*3/uL (ref 4.8–10.8)

## 2014-11-16 ENCOUNTER — Institutional Professional Consult (permissible substitution): Admit: 2014-11-16 | Payer: PRIVATE HEALTH INSURANCE | Attending: PT | Primary: MD

## 2014-11-16 DIAGNOSIS — M1612 Unilateral primary osteoarthritis, left hip: Secondary | ICD-10-CM

## 2014-11-16 NOTE — Treatment Summary (Signed)
PT Daily Note    Patient name: Nancy Richard Visits from [facility]: 05/05/11    Date of Birth: September 14, 1955 Date: 11/16/2014   Start Time: 0730 Stop Time: 0835        Primary/Referral Dx: Left Hip Arthritis (s/p Left Hip Resurfacing 10/03/2014)  Treatment Dx: Nancy Richard Disturbance;Muscle Weakness;Muscle Spasm;Pain in-Hip;Pain in joint-Hip;Stiffness joint-Hip     Next Note due to Physician: 11/17/14    Precautions:   Total Hip Precautions: Posterior     SUBJECTIVE:    [facility].  Feels tingling and more painful.  Cannot tolerate right side lying, pain increases to 9/10 during the treatment.    Pain Level: Pain Level  Pain Score at start of session: 4  Pain Score at end of session: 0 - No Pain     OBJECTIVE:   Ambulates with single point cane into clinic, rotates pelvis posteriorly during midstance.    Treatment:  ? Modality:  [place] 1.3 watts/cm 2 100% TFL/gluteals/  ? Therapeutic Exercise: Prone knee extension with toes on mat 10 x 2 (added to HEP), hip ER in side lying 3-count hold 10 x 2, bridge 10 x 2, supine hip abd/add 10 x 2, elliptical level 1 x 4 minutes, positioning in right side lying with multiple pillows between legs and taught how to unload hip.  ? Gait training:  With [facility] using manual/verbal cueing and mirror for feedback to improve pattern.     ASSESSMENT:  Condition and gait pattern continue to improve.      Patient continues to demonstrate limitations which require skilled PT Intervention to achieve the following goals:     PT Treatment Goals  # weeks: 4    STG 1: Reduce pain level to max 4/10. Outcome 1: Continued    STG 2: Improve sleep:  Will be undisturbed by pain for at least 4 hours. Outcome 2: Continued    STG 3: Will tolerate at least 30 minutes of sitting to eat a meal. Outcome 3: Continued    No Data Recorded No Data Recorded    No Data Recorded  No Data Recorded   # weeks: 6     LTG 1: Increase AROM:  Hip flexion to 85, extension to 10, abduction to 30 and ER to  20 degrees. Outcome 1: Continued    LTG 2: Increase strength left LE by 1/2 muscle grade or better.  Outcome 2: Continued    LTG 3: Nancy Richard will be able to ambulate without assistive device on levels and negotiate stairs with use of handrail.  Outcome 3: Continued    No Data Recorded  No Data Recorded    No Data Recorded  No Data Recorded      PLAN:   Continue for strength, ROM, gait and activity tolerance.  Encouraged pt to increase ambulatory time at home and cleared to use home elliptical machine in preparation for RTW  Continue PT per established plan of care. This note to serve as a Discharge Summary if Nancy Richard is discharged from therapy services.    Irven Shelling, PT  (Billable Time: 54)

## 2014-11-18 ENCOUNTER — Institutional Professional Consult (permissible substitution): Admit: 2014-11-18 | Payer: PRIVATE HEALTH INSURANCE | Attending: PT | Primary: MD

## 2014-11-18 DIAGNOSIS — M1612 Unilateral primary osteoarthritis, left hip: Secondary | ICD-10-CM

## 2014-11-18 NOTE — Treatment Summary (Signed)
PT Daily Note    Patient name: Nancy Richard Visits from [facility]: 06/05/11    Date of Birth: 10-25-1955 Date: 11/18/2014   Start Time: 0746 Stop Time: 0845        Primary/Referral Dx: Left Hip Arthritis (s/p Left Hip Resurfacing 10/03/2014)  Treatment Dx: [name] Disturbance;Muscle Weakness;Muscle Spasm;Pain in-Hip;Pain in joint-Hip;Stiffness joint-Hip     Next Note due to Physician: 11/17/14    Precautions:   Total Hip Precautions: Posterior     SUBJECTIVE:  Continues to have pain in the left hip with sitting and prolonged standing.  Pain Level: Pain Level  Current Status: Pain somewhat limits pt's ability to participate in therapy  Pain Score at start of session: 3  Pain Score at end of session: 1    OBJECTIVE:   Continues to have warmth at the left greater trochanter, decreased left hip ER    Treatment:   Therapeutic Exercise: Supine hip ER with yellow t-band, 10x2; Prone hip ext, 10x2; Bridges with ankle DF, 10x2; Elliptical, L1, 5 min, stride 17; Supine rectus and sartorius stretching, 4 minutes; supine hip abd/add, 10x2.    Therapeutic Activities: None    Manual Therapy: None    Modalities: [place], 2.0 Mhz, cont, 1.4 W/cm2 to left incision and TFL; cold laser 6x20 joules to same area.    ASSESSMENT:   Patient states that after 2 minutes on elliptical, hip pain goes away; less painful with ankles DF'd with bridges; may consider increasing stride length on elliptical to facilitate increased hip extension.   Patient continues to demonstrate limitations which require skilled PT Intervention to achieve the following goals:     PT Treatment Goals  # weeks: 4    STG 1: Reduce pain level to max 4/10. Outcome 1: Continued    STG 2: Improve sleep:  Will be undisturbed by pain for at least 4 hours. Outcome 2: Continued    STG 3: Will tolerate at least 30 minutes of sitting to eat a meal. Outcome 3: Continued    No Data Recorded No Data Recorded    No Data Recorded  No Data Recorded   # weeks: 6     LTG 1: Increase AROM:  Hip  flexion to 85, extension to 10, abduction to 30 and ER to 20 degrees. Outcome 1: Continued    LTG 2: Increase strength left LE by 1/2 muscle grade or better.  Outcome 2: Continued    LTG 3: Nancy Richard will be able to ambulate without assistive device on levels and negotiate stairs with use of handrail.  Outcome 3: Continued    No Data Recorded  No Data Recorded    No Data Recorded  No Data Recorded      PLAN:   Continue   Continue PT per established plan of care. This note to serve as a Discharge Summary if Nancy Richard is discharged from therapy services.    Naomi Fitton C. Chesley Veasey, PT  (Billable Time: 25)

## 2014-11-23 ENCOUNTER — Institutional Professional Consult (permissible substitution): Admit: 2014-11-23 | Payer: PRIVATE HEALTH INSURANCE | Attending: PT | Primary: MD

## 2014-11-23 DIAGNOSIS — M1612 Unilateral primary osteoarthritis, left hip: Secondary | ICD-10-CM

## 2014-11-23 NOTE — Treatment Summary (Signed)
PT Daily Note    Patient name: Nancy Richard Visits from [facility]: 07/07/11    Date of Birth: [birth year] Date: 11/23/2014   Start Time: 0735 Stop Time: 0845        Primary/Referral Dx: Left Hip Arthritis (s/p Left Hip Resurfacing 10/03/2014)  Treatment Dx: [name] Disturbance;Muscle Weakness;Muscle Spasm;Pain in-Hip;Pain in joint-Hip;Stiffness joint-Hip     Next Note due to Physician: 11/17/14    Precautions:   Total Hip Precautions: Posterior     SUBJECTIVE:    Left leg keeps wanting to turn in and I can't control it!      Pain Level: Pain Level  Pain Score at start of session: 4  Pain Score at end of session: 1    OBJECTIVE:   Crying and tearful because of frustration with continued left hip pain and gait dysfunction.    Decreased left hip ER    Treatment:    ? Therapeutic Exercise: NuStep level 6 x 8 minutes, Total Gym level 10/10 x 5, *prone hip extension 10 x 5, Cybex GM no added weight/10 x 5, prone manual anterior hip/thigh stretch 30 seconds x 2, *stretch into hip ER in supine 30 seconds x 3, prone hip ER x 5, PROM by therapist prone hip ER, Elliptical L-1 x 5 min/stride 17, single limb stance x 6, *side lying hip abduction 10 x 5, AROM hip flexion/extension in parallel bars (swinging leg).  *Added to HEP.  ? Modalities: [place] 1.0 MHz 1.2 watts/cm 2 100% to left incision and gluteals.    ASSESSMENT:     Able to perform squats on Total Gym with good hip/knee and ankle alignment.  To rest more during the day at home (currently is not resting at all).     Patient continues to demonstrate limitations which require skilled PT Intervention to achieve the following goals:     PT Treatment Goals  # weeks: 4    STG 1: Reduce pain level to max 4/10. Outcome 1: Continued    STG 2: Improve sleep:  Will be undisturbed by pain for at least 4 hours. Outcome 2: Continued    STG 3: Will tolerate at least 30 minutes of sitting to eat a meal. Outcome 3: Continued    No Data Recorded No Data Recorded    No Data Recorded  No Data  Recorded   # weeks: 6     LTG 1: Increase AROM:  Hip flexion to 85, extension to 10, abduction to 30 and ER to 20 degrees. Outcome 1: Continued    LTG 2: Increase strength left LE by 1/2 muscle grade or better.  Outcome 2: Continued    LTG 3: Jaidah will be able to ambulate without assistive device on levels and negotiate stairs with use of handrail.  Outcome 3: Continued    No Data Recorded  No Data Recorded    No Data Recorded  No Data Recorded      PLAN:     Continue PT per established plan of care. This note to serve as a Discharge Summary if Shatonga Sciulli is discharged from therapy services.    Irven Shelling, PT  (Billable Time: 39)

## 2014-11-25 ENCOUNTER — Institutional Professional Consult (permissible substitution)
Admit: 2014-11-25 | Payer: PRIVATE HEALTH INSURANCE | Attending: Rehabilitative and Restorative Service Providers" | Primary: MD

## 2014-11-25 DIAGNOSIS — M1612 Unilateral primary osteoarthritis, left hip: Secondary | ICD-10-CM

## 2014-11-25 NOTE — Other (Signed)
[facility]  [facility]  [address]  [place] [address]  Dept: [phone]  Fax: [phone]  Loc: [phone]    Physical Therapy Recertification    Patient Name: Nancy Richard Referring Provider: Delfina Redwood, M.D.   Date of Birth: [birth year] PCP: Sherre Scarlet, MD   [facility] Date: 10/19/14  No Data Recorded     Primary Insurance: [employer]  Secondary Insurance: None Referral Date: 10/17/14    Visits from [facility]: 12 MRN: [id number]   Dates of Service: From 10/19/14 to 11/25/14 Gender:  female        Primary/Referral Dx: Left Hip Arthritis (s/p Left Hip Resurfacing 10/03/2014)  Treatment Dx: [name] Disturbance;Muscle Weakness;Muscle Spasm;Pain in-Hip;Pain in joint-Hip;Stiffness joint-Hip      Chief Complaint: Pt is planning on returring ot work next wk. She is still having quite a bit of pain and dysfunction w walking. She is sleeping better. She reports that most of her pain is medial thigh, distal lateral knee and L glutes. She is frustrated that she is not 100%.       PT Initial Problem List       11/25/2014    1. Presence of pain:  Max 4/10, best 1/10.    2. Disturbed sleep:  Awakens after 4-5 hours of sleep.  Has difficulty getting comfortable to fall asleep.    3. Unable to sit for moderate periods of time (max 30 minutes).    4. Decreased A/PROM left hip:  AROM flexion 80, extension neutral, abduction 25, ER 10 degrees.    5. Impaired strength left LE:  hip abd 3+/5, flexion 4/5, ext 3+/5    6. Inability to ambulate without compensatory movement pattern.         PT Treatment Goals  Short Term Goals (# weeks: 4) STG Outcomes   STG 1: Reduce pain level to max 4/10. Outcome 1: Partially Met    STG 2: Reduce pain level to max 4/10. Outcome 2: Met    STG 3: Will tolerate at least 30 minutes of sitting to eat a meal. Outcome 3: Met    No Data Recorded No Data Recorded    No Data Recorded  No Data Recorded   Long Term Goals (# weeks: 6)  LTG Outcomes    LTG 1: Increase AROM:  Hip flexion to 85, extension to 10, abduction to 30 and ER to 20 degrees. Outcome 1: Met    LTG 2: Increase strength left LE by 1/2 muscle grade or better.  Outcome 2: Continued    LTG 3: Kloey will be able to ambulate without assistive device on levels and negotiate stairs with use of handrail.  Outcome 3: Met    LTG 4: pt will be able to ambulate without compensatory gait pattern  Outcome 4: New    No Data Recorded  No Data Recorded          Justification for Continued Care: Pt still needs continued intervention w skilled physical therapy in order to progress towards goals set in IE bc she still exhibits poor movement patterns, weakness, limited tolerance to prolonged postures and functional activities.     Rehab Potential: Good  Treatment Interventions:   Gait Training 13244  Mobilization/Manual Therapy 97140  NM Re-Ed/Balance [id number]  Ther. Ex/Home Program [phone]  Therapeutic Activities [phone]  Elec. Stim- unattended 44034  Core Stabilization Program [id number]  Pelvic Floor Muscle Retraining [phone]  Precautions: Total Hip Precautions: No hip precautions   Comments:  Therapy is needed to increase tissue tolerance, facilitate healing and help decrease compensatory movement patterns esp related to gait and positional tolerance. She also still has significant weakness in hip strength L and limited ext that is affecting functional movement patterns. Pt tends to push herself a bit too much and needs frequent cuing regarding rest and no need for no pain no gain.    Expected Frequency/Duration of Therapy:  2 x/week(s) for 4-6 weeks, or sooner if discharge criteria are met.    ________________________________________________________________________    Thank you for this referral.    Victorian Gunn, PT  11/25/2014    Please sign below if you agree with the provided plan and return to the fax number listed above. Modifications/precautions are appreciated.     I certify the need for these  services furnished under this plan of treatment and while under my care.    Provider Signature: ________________________________________________________      Date/Time: __________________________________________________________________    (Patient Name: Nancy Richard)

## 2014-11-25 NOTE — Treatment Summary (Signed)
PT Daily Note    Patient name: Nancy Richard Visits from [facility]: 08/07/11    Date of Birth: September 29, 1955 Date: 11/25/2014   Start Time: 0745 Stop Time: 0850        Primary/Referral Dx: Left Hip Arthritis (s/p Left Hip Resurfacing 10/03/2014)  Treatment Dx: [name] Disturbance;Muscle Weakness;Muscle Spasm;Pain in-Hip;Pain in joint-Hip;Stiffness joint-Hip     Next Note due to Physician: 12/25/14  Precautions:   Total Hip Precautions: No hip precautions     SUBJECTIVE:  It is not spasming as much. I am going back to work next wk.  I am just concerned that I am not progressing like I should be.      Pain Level: Pain Level  Current Status: Pain somewhat limits pt's ability to participate in therapy  Pain Score at start of session: 5  Pain Score at end of session: 1  Pain with activity: 4  Pain Types: Chronic Pain  Pain Location: Hip    OBJECTIVE:     MMT hip abd 3+/5 L, hip ext 3+/5 L ; R hip 5-/5 grossly  ROM - PROM 90 hip flexion, 25 hip abd, 3 hip ext   Gait - pt has decreased active ext L, decreased stance time L  and moderate vaulting through the L LE.       Treatment:  Re-evaluation.     ? Manual therapy: soft tissue mobilization; glut, piri, itb, adductor complex; hip traction 90/90, long leg gr III/IV; post/ant glide hip gr III/IV  ? Therapeutic Exercise: (see flow sheet)retro gait *, hip unloading w tband (pilates)*, prone lying, modified thomas stretch *  *Added to HEP.    ASSESSMENT:  Pt has extensive muscle guarding and compensatory movement patterns due to overuse of global stabilizers and limited ability to recruit local stabilizers.  Her inability to let go of her hip flexors, adductors and quads,s is causing hip to constantly be in closed- packed position and causing overuse of ITB.  There is excessive tightness and tenderness in her ITB, sartorius, gracilis, piriformis. Pt has made good progress towards all goals set in IE. She would beneift from incorporating more rest breaks to decrease  compensation.Therapy is needed to increase tissue tolerance, facilitate healing and help decrease compensatory movement patterns.        Patient continues to demonstrate limitations which require skilled PT Intervention to achieve the following goals:     PT Treatment Goals  # weeks: 4    STG 1: Reduce pain level to max 4/10. Outcome 1: Partially Met    STG 2: Reduce pain level to max 4/10. Outcome 2: Met    STG 3: Will tolerate at least 30 minutes of sitting to eat a meal. Outcome 3: Met    No Data Recorded No Data Recorded    No Data Recorded  No Data Recorded   # weeks: 6     LTG 1: Increase AROM:  Hip flexion to 85, extension to 10, abduction to 30 and ER to 20 degrees. Outcome 1: Met    LTG 2: Increase strength left LE by 1/2 muscle grade or better.  Outcome 2: Continued    LTG 3: Nancy Richard will be able to ambulate without assistive device on levels and negotiate stairs with use of handrail.  Outcome 3: Met    LTG 4: pt will be able to ambulate without compensatory gait pattern  Outcome 4: New    No Data Recorded  No Data Recorded  PLAN:      This note to serve as a Discharge Summary if Nancy Richard is discharged from therapy services.    Shamel Germond, PT  (Billable Time: 20)

## 2014-11-28 ENCOUNTER — Institutional Professional Consult (permissible substitution): Admit: 2014-11-28 | Payer: PRIVATE HEALTH INSURANCE | Attending: PT | Primary: MD

## 2014-11-28 DIAGNOSIS — M1612 Unilateral primary osteoarthritis, left hip: Secondary | ICD-10-CM

## 2014-11-28 NOTE — Treatment Summary (Signed)
PT Daily Note    Patient name: Nancy Richard Visits from [facility]: 13/13/25    Date of Birth: Mar 20, 1956 Date: 11/28/2014   Start Time: 1405 Stop Time: 1500        Primary/Referral Dx: Left Hip Arthritis (s/p Left Hip Resurfacing 10/03/2014)  Treatment Dx: Nancy Richard Disturbance;Muscle Weakness;Muscle Spasm;Pain in-Hip;Pain in joint-Hip;Stiffness joint-Hip     Next Note due to Physician: 11/17/14    Precautions:   Total Hip Precautions: No hip precautions     SUBJECTIVE:  Less pain; using elliptical at home rather than walking  Soreness in calf nearly released; doing STM to mm with MOJI    Scheduled to RTW Thursday for 8 hours  Stopped the slow walking with buttocks contracted because it was painful; plans to walk fast at work because it is required and also because it is less painful    Pain Level: Pain Level  Pain Score at start of session: 3    OBJECTIVE:   Modified gait and no assistive device  Unable to stand or lie supine without significant thoracolumbar lordosis    Treatment: Manual therapy:  STM, scar massage, left hip lateral joint distraction glides with flexion glides, adductor stretches    Therapeutic exercises:  Supine adductor stretch with strap to support lateral leg (painful if hips flexed to 90 degrees), clamshell (unable and painful), sidelying hip abduction (pain unless started in approx 10 degrees of abduction; adduction painful), prone PGM (unable so modified with right hip flexed over edge of bed (unable) and pelvis up on pillows to start in slight hip flexion (better but feels in quads), sidelying hip extension (no pain if positioned in neutral or slight abduction and leg supported (vs lifting to Richard pillow)), hip flexor stretch (modifed home exercise of supine with leg off edge of bed to tall kneeling to decrease lumbar lordosis or supine with contralateral SKC), standing posture correction at wall with instruct in posterior pelvic tilt and addition of lat stretch while maintaining core (cues to  decrease IR of hips)    ASSESSMENT:  Doing better.  Pain more related to certain positions like sidelying with leg adducted and ER in greater than 90 degrees of flexion.     Patient continues to demonstrate limitations which require skilled PT Intervention to achieve the following goals:     PT Treatment Goals  # weeks: 4    STG 1: Reduce pain level to max 4/10. Outcome 1: Partially Met    STG 2: Reduce pain level to max 4/10. Outcome 2: Met    STG 3: Will tolerate at least 30 minutes of sitting to eat a meal. Outcome 3: Met    No Data Recorded No Data Recorded    No Data Recorded  No Data Recorded   # weeks: 6     LTG 1: Increase AROM:  Hip flexion to 85, extension to 10, abduction to 30 and ER to 20 degrees. Outcome 1: Met    LTG 2: Increase strength left LE by 1/2 muscle grade or better.  Outcome 2: Continued    LTG 3: Nancy Richard will be able to ambulate without assistive device on levels and negotiate stairs with use of handrail.  Outcome 3: Met    LTG 4: pt will be able to ambulate without compensatory gait pattern  Outcome 4: New    No Data Recorded  No Data Recorded      PLAN:     Continue PT per established plan of care. This  note to serve as a Discharge Summary if Nancy Richard is discharged from therapy services.    Nancy Richard, PT  (Billable Time: 55)

## 2014-11-29 ENCOUNTER — Encounter: Admit: 2014-11-29 | Discharge: 2014-11-29 | Attending: Family | Primary: MD

## 2014-11-30 ENCOUNTER — Institutional Professional Consult (permissible substitution): Admit: 2014-11-30 | Payer: PRIVATE HEALTH INSURANCE | Attending: PT | Primary: MD

## 2014-11-30 DIAGNOSIS — M1612 Unilateral primary osteoarthritis, left hip: Secondary | ICD-10-CM

## 2014-11-30 NOTE — Treatment Summary (Signed)
PT Daily Note    Patient name: Haevyn Ury Visits from [facility]: 14/14/25    Date of Birth: 01-24-1956 Date: 11/30/2014   Start Time: 0735 Stop Time: 0845        Primary/Referral Dx: Left Hip Arthritis (s/p Left Hip Resurfacing 10/03/2014)  Treatment Dx: [name] Disturbance;Muscle Weakness;Muscle Spasm;Pain in-Hip;Pain in joint-Hip;Stiffness joint-Hip     Next Note due to Physician: 11/17/14    Precautions:   Total Hip Precautions: No hip precautions     SUBJECTIVE:  Getting better, but it's so slow.  Continues to experience moderate pain with weight bearing.  Plans to return to work tomorrow.  Reports being able to lift a casserole dish and walk around in kitchen and stand for prolong period of time while cooking this week without an increase in pain.    Pain Level: Pain Level  Pain Score at start of session: 5  Pain Score at end of session: 4    OBJECTIVE:   Gait dysfunction with decreased stance time left LE, posterior pelvic rotation with hip IR during stance to preswing phase of gait.    Unable to stand or lie supine without significant thoracolumbar lordosis.    Treatment:    ? Manual therapy:  STM/IASTM hip adductors/glut medius/piriformis/ITB, manual stretch into hip abduction and then ER, long arm hip traction and 90/90 hip traction, strain/counterstrain left IL (taught how to perform at home).  ? Therapeutic exercise:  Reviewed and adjusted tall kneeling anterior hip and thigh stretch (positioned LE in midline versus preferred or automatic IR and performed posterior pelvic tilt to increase stretch).  ? Modality:  Ultrasound 1.0 MHz 1.3 watts/cm 2 100% glut medius.    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     Patient continues to demonstrate limitations which require skilled PT Intervention to achieve the following goals:     PT Treatment Goals  # weeks: 4    STG 1: Reduce  pain level to max 4/10. Outcome 1: Partially Met    STG 2: Reduce pain level to max 4/10. Outcome 2: Met    STG 3: Will tolerate at least 30 minutes of sitting to eat a meal. Outcome 3: Met    No Data Recorded No Data Recorded    No Data Recorded  No Data Recorded   # weeks: 6     LTG 1: Increase AROM:  Hip flexion to 85, extension to 10, abduction to 30 and ER to 20 degrees. Outcome 1: Met    LTG 2: Increase strength left LE by 1/2 muscle grade or better.  Outcome 2: Continued    LTG 3: Nyaisha will be able to ambulate without assistive device on levels and negotiate stairs with use of handrail.  Outcome 3: Met    LTG 4: pt will be able to ambulate without compensatory gait pattern  Outcome 4: New    No Data Recorded  No Data Recorded      PLAN:     Continue PT per established plan of care. This note to serve as a Discharge Summary if Porter Nakama is discharged from therapy services.    Irven Shelling, PT  (Billable Time: 51)

## 2014-12-06 ENCOUNTER — Institutional Professional Consult (permissible substitution): Admit: 2014-12-06 | Payer: PRIVATE HEALTH INSURANCE | Attending: PT | Primary: MD

## 2014-12-06 DIAGNOSIS — M1612 Unilateral primary osteoarthritis, left hip: Secondary | ICD-10-CM

## 2014-12-06 NOTE — Treatment Summary (Signed)
PT Daily Note    Patient name: Nancy Richard Visits from [facility]: 15/15/25    Date of Birth: [birth year] Date: 12/06/2014   Start Time: 0735 Stop Time: 0830        Primary/Referral Dx: Left Hip Arthritis (s/p Left Hip Resurfacing 10/03/2014)  Treatment Dx: [name] Disturbance;Muscle Weakness;Muscle Spasm;Pain in-Hip;Pain in joint-Hip;Stiffness joint-Hip     Next Note due to Physician: 11/17/14    Precautions:   Total Hip Precautions: No hip precautions     SUBJECTIVE:  Noticing an improvement in hip range, especially after doing the strain/counterstrain position for left iliopsoas.  Plans to return to work next week, 12 hour shifts.    Pain Level: Pain Level  Pain Score at start of session: 4  Pain Score at end of session: 2    OBJECTIVE:     Posture:  Excessive lumbar lordosis and thoracic kyphosis.  Palpation:  Rigid left lumbar paraspinals, increased tone hip adductors.  Special Tests:  Positive Thomas test for short iliopsoas, quadriceps and ITB.  Gait:  Decreased stance time left LE, posterior pelvic rotation with hip IR during stance to preswing phase of gait.  No assistive device.  Alignment:  Left upslip.    Treatment:    ? Manual therapy:  STM left hip adductors/piriformis/lumbar paraspinals and QL, manual stretch into hip abduction, manual stretch into hip extension with knee extended and with knee flexed, long arm hip traction.  ? Therapeutic exercise:  Quadriped lumbar flexion/extension and side bend, supine modified Thomas test with leg off the side of the bed, reviewed tall kneeling anterior hip and thigh stretch.  ? Modality:  Ultrasound 1.0 MHz 1.3 watts/cm 2 100% left lumbar paraspinals.    ASSESSMENT:    Some increase in hip stiffness after manual stretch, resolved with dangling and swinging leg in standing and pain level decreased by end of treatment.  Compensates for lack of hip motion with excessive lumbar extension.  Symmetrical pelvic landmarks after treatment.     Patient continues to  demonstrate limitations which require skilled PT Intervention to achieve the following goals:     PT Treatment Goals  # weeks: 4    STG 1: Reduce pain level to max 4/10. Outcome 1: Partially Met    STG 2: Reduce pain level to max 4/10. Outcome 2: Met    STG 3: Will tolerate at least 30 minutes of sitting to eat a meal. Outcome 3: Met    No Data Recorded No Data Recorded    No Data Recorded  No Data Recorded   # weeks: 6     LTG 1: Increase AROM:  Hip flexion to 85, extension to 10, abduction to 30 and ER to 20 degrees. Outcome 1: Met    LTG 2: Increase strength left LE by 1/2 muscle grade or better.  Outcome 2: Continued    LTG 3: Grant will be able to ambulate without assistive device on levels and negotiate stairs with use of handrail.  Outcome 3: Met    LTG 4: pt will be able to ambulate without compensatory gait pattern  Outcome 4: New    No Data Recorded  No Data Recorded      PLAN:     Continue with manual therapy, therapeutic exercise,balance activities, gait training and modalities prn to improve hip range of motion, reduce pain and gait pattern.  PT per established plan of care. This note to serve as a Discharge Summary if Gray Maugeri is discharged from  therapy services.    Irven Shelling, PT  (Billable Time: 68)

## 2014-12-09 ENCOUNTER — Institutional Professional Consult (permissible substitution): Admit: 2014-12-12 | Payer: PRIVATE HEALTH INSURANCE | Attending: PT | Primary: MD

## 2014-12-09 DIAGNOSIS — M1612 Unilateral primary osteoarthritis, left hip: Secondary | ICD-10-CM

## 2014-12-09 NOTE — Treatment Summary (Signed)
PT Daily Note    Patient name: Krisha Beegle Visits from [facility]: 16/16/25    Date of Birth: 05-25-1956 Date: 12/09/2014   Start Time: 1530 Stop Time: 1625        Primary/Referral Dx: Left Hip Arthritis (s/p Left Hip Resurfacing 10/03/2014)  Treatment Dx: [name] Disturbance;Muscle Weakness;Muscle Spasm;Pain in-Hip;Pain in joint-Hip;Stiffness joint-Hip     Next Note due to Physician: 11/17/14    Precautions:   Total Hip Precautions: No hip precautions     SUBJECTIVE:  Patient has been up standing up cooking an baking all day.  Hasn't had much pain during the activity.  Pain is worse at night.  Pain Level: Pain Level  Current Status: Pain somewhat limits pt's ability to participate in therapy  Pain Score at start of session: 2  Pain Score at end of session: 2    OBJECTIVE:   Ambulates with left hip rotation and limited hip extension.  Increased lordosis of the lumbar spine and inability to keep lumbar spine flat while performing hip extension stretch.    Treatment:   Therapeutic Exercise: Supine hip extension stretch with right hip flexed to maximum to maintain a flat back;  Hold/relax for hip extension; Manual hip abduction stretch.    Therapeutic Activities: Instructed in lying on foam roll to help stretch hip and mobilize the thoracic spine.    Manual Therapy: SCS to left iliopsoas muscle    Modalities: None    ASSESSMENT:   Patient unable to maintain flat lumbar spine while stretching the left hip flexor.  Has discomfort in the quadriceps and iliopsoas with stretching.  Able to stretch hip extensors while on the foam roll with less discomfort and able to maintain flat lumbar spine.  Patient continues to demonstrate limitations which require skilled PT Intervention to achieve the following goals:     PT Treatment Goals  # weeks: 4    STG 1: Reduce pain level to max 4/10. Outcome 1: Partially Met    STG 2: Reduce pain level to max 4/10. Outcome 2: Met    STG 3: Will tolerate at least 30 minutes of sitting to eat a  meal. Outcome 3: Met    No Data Recorded No Data Recorded    No Data Recorded  No Data Recorded   # weeks: 6     LTG 1: Increase AROM:  Hip flexion to 85, extension to 10, abduction to 30 and ER to 20 degrees. Outcome 1: Met    LTG 2: Increase strength left LE by 1/2 muscle grade or better.  Outcome 2: Continued    LTG 3: Aaria will be able to ambulate without assistive device on levels and negotiate stairs with use of handrail.  Outcome 3: Met    LTG 4: pt will be able to ambulate without compensatory gait pattern  Outcome 4: New    No Data Recorded  No Data Recorded      PLAN:   Continue   Continue PT per established plan of care. This note to serve as a Discharge Summary if Chasta Deshpande is discharged from therapy services.    Sharnetta Gielow C. Airanna Partin, PT  (Billable Time: 55)

## 2014-12-12 ENCOUNTER — Encounter: Admit: 2014-12-12 | Discharge: 2014-12-12 | Attending: Family | Primary: MD

## 2014-12-12 ENCOUNTER — Institutional Professional Consult (permissible substitution): Admit: 2014-12-12 | Payer: PRIVATE HEALTH INSURANCE | Attending: PT | Primary: MD

## 2014-12-12 DIAGNOSIS — M1612 Unilateral primary osteoarthritis, left hip: Secondary | ICD-10-CM

## 2014-12-12 NOTE — Treatment Summary (Signed)
PT Daily Note    Patient name: Nancy Richard Visits from [facility]: 17/17/25    Date of Birth: 1956/05/27 Date: 12/12/2014   Start Time: 0735 Stop Time: 0835        Primary/Referral Dx: Left Hip Arthritis (s/p Left Hip Resurfacing 10/03/2014)  Treatment Dx: Nancy Richard Disturbance;Muscle Weakness;Muscle Spasm;Pain in-Hip;Pain in joint-Hip;Stiffness joint-Hip     Next Note due to Physician: 11/17/14    Precautions:   Total Hip Precautions: No hip precautions     SUBJECTIVE:  Experienced pain rated at 5-6/10 while a passenger in her car this morning.  But pain is less now.    Pain Level: Pain Level  Pain Score at start of session: 3    OBJECTIVE:   Ambulates with left hip rotation, limited hip extension and decreased stance time left LE.  Increased lordosis of the lumbar spine and difficulty keeping lumbar spine flat while performing hip extension stretch.    Treatment:    ? Therapeutic Exercise: Manual supine hip extension stretch with left knee flexed and leg dangling off end of bed (Thomas test position) and then with knee extended, manual hip ER and manual hip abduction stretches.  NuStep level 1 x 8 minutes.  ? Manual Therapy: STM left iliopsoas, adductors, ITB.  ? Modalities: Ultrasound 1.0 MHz 1.3 watts/cm 2 100% anteromedial hip/groin.    ASSESSMENT:   Continues with gait dysfunction and hip flexion contracture.    Patient continues to demonstrate limitations which require skilled PT Intervention to achieve the following goals:     PT Treatment Goals  # weeks: 4    STG 1: Reduce pain level to max 4/10. Outcome 1: Partially Met    STG 2: Reduce pain level to max 4/10. Outcome 2: Met    STG 3: Will tolerate at least 30 minutes of sitting to eat a meal. Outcome 3: Met    No Data Recorded No Data Recorded    No Data Recorded  No Data Recorded   # weeks: 6     LTG 1: Increase AROM:  Hip flexion to 85, extension to 10, abduction to 30 and ER to 20 degrees. Outcome 1: Met    LTG 2: Increase strength left LE by 1/2 muscle  grade or better.  Outcome 2: Continued    LTG 3: Nancy Richard will be able to ambulate without assistive device on levels and negotiate stairs with use of handrail.  Outcome 3: Met    LTG 4: pt will be able to ambulate without compensatory gait pattern  Outcome 4: New    No Data Recorded  No Data Recorded      PLAN:     Continue PT per established plan of care. This note to serve as a Discharge Summary if Nancy Richard is discharged from therapy services.    Irven Shelling, PT  (Billable Time: 60)

## 2014-12-14 ENCOUNTER — Institutional Professional Consult (permissible substitution): Admit: 2014-12-14 | Payer: PRIVATE HEALTH INSURANCE | Attending: PT | Primary: MD

## 2014-12-14 DIAGNOSIS — M1612 Unilateral primary osteoarthritis, left hip: Secondary | ICD-10-CM

## 2014-12-14 NOTE — Treatment Summary (Signed)
PT Daily Note    Patient name: Nancy Richard Visits from [facility]: 18/18/25    Date of Birth: [birth year] Date: 12/14/2014   Start Time: 0730 Stop Time: 0845        Primary/Referral Dx: Left Hip Arthritis (s/p Left Hip Resurfacing 10/03/2014)  Treatment Dx: Nancy Richard Disturbance;Muscle Weakness;Muscle Spasm;Pain in-Hip;Pain in joint-Hip;Stiffness joint-Hip     Next Note due to Physician: 11/17/14    Precautions:   Total Hip Precautions: No hip precautions     SUBJECTIVE:  Felt much better after last session.  Went for a one mile walk and also worked in the garden.  Today, pain is higher than it has been.    Pain Level: Pain Level  Pain Score at start of session: 6  Pain Score at end of session: 0 - No Pain    OBJECTIVE:   Ambulates with left hip rotation, limited hip extension and decreased stance time left LE, vaulting during swing phase.  Increased thoracic kyphosis and lumbar lordosis.  Treatment:    ? Therapeutic Exercise: Manual supine hip extension stretch with left knee flexed and leg dangling off end of bed (Thomas test position) and then with knee extended, manual hip ER and hip abduction stretches.  Prone knee extension/hip extension with toes of foot on mat 10 x 3, prone hip extension 10 x 4 (with abdominals engaged and manual feedback for neutral spine), anterior hip/thigh stretch in parallel bars x 5 (30 seconds each), core stabilization performing prone knee flexion x 10, gait activities to reduce dysfunction.  ? Manual Therapy: STM left iliopsoas, adductors.  ? Modalities: Ultrasound 1.0 MHz 1.3 watts/cm 2 100% anteromedial hip/groin.    ASSESSMENT:   Continues with gait dysfunction and hip flexion contracture, but gait pattern improved by end of treatment.    Patient continues to demonstrate limitations which require skilled PT Intervention to achieve the following goals:     PT Treatment Goals  # weeks: 4    STG 1: Reduce pain level to max 4/10. Outcome 1: Partially Met    STG 2: Reduce pain level to max  4/10. Outcome 2: Met    STG 3: Will tolerate at least 30 minutes of sitting to eat a meal. Outcome 3: Met    No Data Recorded No Data Recorded    No Data Recorded  No Data Recorded   # weeks: 6     LTG 1: Increase AROM:  Hip flexion to 85, extension to 10, abduction to 30 and ER to 20 degrees. Outcome 1: Met    LTG 2: Increase strength left LE by 1/2 muscle grade or better.  Outcome 2: Continued    LTG 3: Nancy Richard will be able to ambulate without assistive device on levels and negotiate stairs with use of handrail.  Outcome 3: Met    LTG 4: pt will be able to ambulate without compensatory gait pattern  Outcome 4: New    No Data Recorded  No Data Recorded      PLAN:     Continue PT per established plan of care. This note to serve as a Discharge Summary if Nancy Richard is discharged from therapy services.    Nancy Richard, PT  (Billable Time: 16)

## 2014-12-19 ENCOUNTER — Encounter: Payer: PRIVATE HEALTH INSURANCE | Attending: PT | Primary: MD

## 2014-12-23 ENCOUNTER — Institutional Professional Consult (permissible substitution): Admit: 2014-12-23 | Payer: PRIVATE HEALTH INSURANCE | Attending: PT | Primary: MD

## 2014-12-23 DIAGNOSIS — M1612 Unilateral primary osteoarthritis, left hip: Secondary | ICD-10-CM

## 2014-12-23 NOTE — Treatment Summary (Signed)
PT Daily Note    Patient name: Nancy Richard Visits from [facility]: 19/19/25    Date of Birth: 19-Jul-1956 Date: 12/23/2014   Start Time: 1005 Stop Time: 1100        Primary/Referral Dx: Left Hip Arthritis (s/p Left Hip Resurfacing 10/03/2014)  Treatment Dx: Nancy Richard;Nancy Richard;Nancy Richard;Nancy Richard;Nancy Richard;Nancy Richard     Next Note due to Physician: 11/17/14    Precautions:   Total Hip Precautions: No hip precautions     SUBJECTIVE:  No Nancy except sit to stand    Nancy Level: Nancy Level  Nancy Score at start of session: 0 - No Nancy  Nancy with activity: 7 (sit to stand)    OBJECTIVE:   Ambulates with left hip rotation, limited hip extension and decreased stance time left LE, vaulting during swing phase.  Increased thoracic kyphosis and lumbar lordosis.    Treatment:    ? Therapeutic Exercise: Nustep L5 x 10 min with focus on minimizing IR with extension; lunge on 8 step with dynadisc x 10; step over on rocker board 10 each; 2# x 15ea hip abd; PGM x 15 ea; instructed in stepping ex and reviewed tall kneeling hip flexor stretch to HEP  ? Manual Therapy: STM left adductors, lateral joint glides left hip followed by stretches and MR abd/ER.  ? Modalities: Ultrasound 1.0 MHz 1.0 watts/cm 2 100% anteromedial hip/groin.    ASSESSMENT:   Tends to be fast moving with gait and ex; states her mother was a fast walker also.  Encouraged pt to slow her pace and focus on form rather than incorporating it with distance ambulation    Patient continues to demonstrate limitations which require skilled PT Intervention to achieve the following goals:     PT Treatment Goals  # weeks: 4    STG 1: Reduce Nancy level to max 4/10. Outcome 1: Partially Met    STG 2: Reduce Nancy level to max 4/10. Outcome 2: Met    STG 3: Will tolerate at least 30 minutes of sitting to eat a meal. Outcome 3: Met    No Data Recorded No Data Recorded    No Data Recorded  No Data Recorded   # weeks: 6     LTG 1: Increase AROM:  Hip  flexion to 85, extension to 10, abduction to 30 and ER to 20 degrees. Outcome 1: Met    LTG 2: Increase strength left LE by 1/2 Nancy grade or better.  Outcome 2: Continued    LTG 3: Nancy Richard will be able to ambulate without assistive device on levels and negotiate stairs with use of handrail.  Outcome 3: Met    LTG 4: pt will be able to ambulate without compensatory gait pattern  Outcome 4: New    No Data Recorded  No Data Recorded      PLAN:     Continue PT per established plan of care. This note to serve as a Discharge Summary if Nancy Richard is discharged from therapy services.    Nancy Richard, PT  (Billable Time: 55)

## 2014-12-28 ENCOUNTER — Institutional Professional Consult (permissible substitution): Admit: 2014-12-28 | Payer: PRIVATE HEALTH INSURANCE | Attending: PT | Primary: MD

## 2014-12-28 DIAGNOSIS — M1612 Unilateral primary osteoarthritis, left hip: Secondary | ICD-10-CM

## 2014-12-28 NOTE — Treatment Summary (Signed)
PT Daily Note    Patient name: Nancy Richard Visits from [facility]: 20/20/25    Date of Birth: 1955/10/17 Date: 12/28/2014   Start Time: 0735 Stop Time: 0830        Primary/Referral Dx: Left Hip Arthritis (s/p Left Hip Resurfacing 10/03/2014)  Treatment Dx: [name] Disturbance;Muscle Weakness;Muscle Spasm;Pain in-Hip;Pain in joint-Hip;Stiffness joint-Hip     Next Note due to Physician: 11/17/14    Precautions:   Total Hip Precautions: No hip precautions     SUBJECTIVE:  Did not have pain last session until after using NuStep machine and then felt pain with sit to stand.  Tolerating work well.    Pain Level: Pain Level  Pain Score at start of session: 1  Pain Score at end of session: 0 - No Pain    OBJECTIVE:   Ambulates with left hip internal rotation, limited hip extension and decreased stance time left LE, vaulting during swing phase.  Increased thoracic kyphosis and lumbar lordosis.    Treatment:    ? Therapeutic Exercise: NuStep using legs only L6 x 5 min/L4 x 5 min with focus on minimizing IR with knee extension; manual anterior hip and thigh and ITB stretch with leg off edge of mat, prone hip ER strengthening with yellow theraband (given to use at home) x 15, prone hip extension with knee flexed x 20, bridging x 30, tall 1/2 kneeling anterior hip and thigh stretch x 30 seconds and then with hip IR, hip stretch into ER seated with back supported on wall, hip stretch into abduction seated with back supported on wall and manual assist to avoid hip IR.  ? Manual Therapy: STM left adductors.  ? Modalities: Ultrasound 1.0 MHz 1.2 watts/cm 2 100% anteromedial hip/groin.    ASSESSMENT:   Improved gait pattern after treatment.    Patient continues to demonstrate limitations which require skilled PT Intervention to achieve the following goals:     PT Treatment Goals  # weeks: 4    STG 1: Reduce pain level to max 4/10. Outcome 1: Partially Met    STG 2: Reduce pain level to max 4/10. Outcome 2: Met    STG 3: Will tolerate at  least 30 minutes of sitting to eat a meal. Outcome 3: Met    No Data Recorded No Data Recorded    No Data Recorded  No Data Recorded   # weeks: 6     LTG 1: Increase AROM:  Hip flexion to 85, extension to 10, abduction to 30 and ER to 20 degrees. Outcome 1: Met    LTG 2: Increase strength left LE by 1/2 muscle grade or better.  Outcome 2: Continued    LTG 3: Clatie will be able to ambulate without assistive device on levels and negotiate stairs with use of handrail.  Outcome 3: Met    LTG 4: pt will be able to ambulate without compensatory gait pattern  Outcome 4: New    No Data Recorded  No Data Recorded      PLAN:     Continue PT per established plan of care. This note to serve as a Discharge Summary if Nancy Richard is discharged from therapy services.    Irven Shelling, PT  (Billable Time: 34)

## 2015-01-03 ENCOUNTER — Institutional Professional Consult (permissible substitution): Admit: 2015-01-03 | Payer: PRIVATE HEALTH INSURANCE | Attending: PT | Primary: MD

## 2015-01-03 DIAGNOSIS — M1612 Unilateral primary osteoarthritis, left hip: Secondary | ICD-10-CM

## 2015-01-03 NOTE — Treatment Summary (Signed)
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    Pain Level: Pain Level  Pain Score at start of session: 4  Pain Score at end of session: 4      Pain was initially located in anterior hip, by end of treatment pain in anterior hip was a 0/10 and 4/10 pain was located in gluteus maximus region.    OBJECTIVE:   Ambulates with left hip internal rotation, limited hip extension and decreased stance time left LE, vaulting during swing phase.  Increased thoracic kyphosis and lumbar lordosis.    Treatment:    ? Therapeutic Exercise: NuStep using legs only L5 x 8 min with focus on minimizing IR with knee extension; manual anterior hip and thigh and ITB stretch with leg off edge of mat, prone hip stretch into ER.  ? Manual Therapy: STM left adductors/quads/iliopsoas and ITB; manual stretching hip into ER, extension, knee flexion in prone and abduction; strain/counterstrain left IL; kinesiotape quads and ITB/two I-strips from distal to proximal; manual hip traction.  ? Modalities: Ultrasound 1.0 MHz 1.2 watts/cm 2 100% anterolateral hip.    ASSESSMENT:   Increasing hip ER range of motion.  Continues with gait dysfunction.    Patient continues to demonstrate limitations which require skilled PT Intervention to achieve the following goals:     PT Treatment Goals  # weeks: 4    STG 1: Reduce pain level to max 4/10. Outcome 1: Partially Met    STG 2: Reduce pain level to max 4/10. Outcome 2: Met    STG 3: Will tolerate at least 30  minutes of sitting to eat a meal. Outcome 3: Met    No Data Recorded No Data Recorded    No Data Recorded  No Data Recorded   # weeks: 6     LTG 1: Increase AROM:  Hip flexion to 85, extension to 10, abduction to 30 and ER to 20 degrees. Outcome 1: Met    LTG 2: Increase strength left LE by 1/2 muscle grade or better.  Outcome 2: Continued    LTG 3: Nancy Richard will be able to ambulate without assistive device on levels and negotiate stairs with use of handrail.  Outcome 3: Met    LTG 4: pt will be able to ambulate without compensatory gait pattern  Outcome 4: New    No Data Recorded  No Data Recorded      PLAN:     Continue PT per established plan of care. This note to serve as a Discharge Summary if Nancy Richard is discharged from therapy services.    Irven Shelling, PT  (Billable Time: 78)

## 2015-01-06 ENCOUNTER — Institutional Professional Consult (permissible substitution)
Admit: 2015-01-06 | Payer: PRIVATE HEALTH INSURANCE | Attending: Rehabilitative and Restorative Service Providers" | Primary: MD

## 2015-01-06 DIAGNOSIS — M1612 Unilateral primary osteoarthritis, left hip: Secondary | ICD-10-CM

## 2015-01-06 NOTE — Treatment Summary (Signed)
PT Daily Note    Patient name: Kashina Mecum Visits from [facility]:  /22/25    Date of Birth: [birth year] Date: 01/06/2015   Start Time: 0845 Stop Time:          Primary/Referral Dx: Left Hip Arthritis (s/p Left Hip Resurfacing 10/03/2014)  Treatment Dx: [name] Disturbance;Muscle Weakness;Muscle Spasm;Pain in-Hip;Pain in joint-Hip;Stiffness joint-Hip     Next Note due to Physician: 11/17/14    Precautions:   Total Hip Precautions: No hip precautions     SUBJECTIVE:  I fell off my lawn mower, but it didn't hurt. i just stayed down there and weeded.   Pain Level:        Pain was initially located L post hip, R SI     OBJECTIVE:   Pt needs constant cues to avoid rushing exercises. Pt has very poor coodination w hip circles and struggled doing clam exercises.   Treatment:    ? Therapeutic Exercise: see flow sheet 25 mins  ? Manual Therapy: STM left adductors/quads/iliopsoas and ITB; manual stretching hip into ER, extension, knee flexion in prone and abduction; strain/counterstrain left IL; kinesiotape buttock in to origin. manual hip traction. 30 mins     ASSESSMENT:   Increasing hip ER range of motion, pt extremely tight in adductors and hip rotators.  Continues with gait dysfunction.    Patient continues to demonstrate limitations which require skilled PT Intervention to achieve the following goals:     PT Treatment Goals  # weeks: 4    STG 1: Reduce pain level to max 4/10. Outcome 1: Partially Met    STG 2: Reduce pain level to max 4/10. Outcome 2: Met    STG 3: Will tolerate at least 30 minutes of sitting to eat a meal. Outcome 3: Met    No Data Recorded No Data Recorded    No Data Recorded  No Data Recorded   # weeks: 6     LTG 1: Increase AROM:  Hip flexion to 85, extension to 10, abduction to 30 and ER to 20 degrees. Outcome 1: Met    LTG 2: Increase strength left LE by 1/2 muscle grade or better.  Outcome 2: Continued    LTG 3: Grecia will be able to ambulate without assistive device on levels and negotiate stairs  with use of handrail.  Outcome 3: Met    LTG 4: pt will be able to ambulate without compensatory gait pattern  Outcome 4: New    No Data Recorded  No Data Recorded      PLAN:   Porgress towards I management.   Continue PT per established plan of care. This note to serve as a Discharge Summary if Retha Bither is discharged from therapy services.    Emile Kyllo, PT  ( 55 mins billable )

## 2015-01-09 ENCOUNTER — Institutional Professional Consult (permissible substitution): Admit: 2015-01-09 | Payer: PRIVATE HEALTH INSURANCE | Attending: PT | Primary: MD

## 2015-01-09 DIAGNOSIS — M1612 Unilateral primary osteoarthritis, left hip: Secondary | ICD-10-CM

## 2015-01-09 NOTE — Treatment Summary (Signed)
PT Daily Note    Patient name: Nancy Richard Visits from [facility]: 23/23/25    Date of Birth: Aug 19, 1956 Date: 01/09/2015   Start Time: 0735 Stop Time: 0835        Primary/Referral Dx: Left Hip Arthritis (s/p Left Hip Resurfacing 10/03/2014)  Treatment Dx: [name] Disturbance;Muscle Weakness;Muscle Spasm;Pain in-Hip;Pain in joint-Hip;Stiffness joint-Hip     Next Note due to Physician: 11/17/14    Precautions:   Total Hip Precautions: No hip precautions     SUBJECTIVE:    Not really having any pain right now, maybe a .5/10.  This pain is located in the buttock.    Pain Level: Pain Level  Pain Score at start of session: 1  Pain Score at end of session: 0 - No Pain      OBJECTIVE:     Ambulates with pelvic posterior rotation and excessive lumbar extension during left LE stance phase.  Pt needs constant cues to avoid rushing exercises, struggled doing clam exercises in side lying position.    Treatment:    ? Therapeutic Exercise: See exercise flow sheet.  ? Modality:  Ultrasound 1.0 MHz 1.2 watts/cm 2 100% left posterior hip/buttock.  ? Gait Training:  To reduce dysfunction with verbal and manual cueing given for gait on level surface.    ASSESSMENT:   Increasing hip ER range of motion, pt extremely tight in adductors and hip rotators, weak hip extensors and ER.  Continues with gait dysfunction.    Patient continues to demonstrate limitations which require skilled PT Intervention to achieve the following goals:     PT Treatment Goals  # weeks: 4    STG 1: Reduce pain level to max 4/10. Outcome 1: Partially Met    STG 2: Reduce pain level to max 4/10. Outcome 2: Met    STG 3: Will tolerate at least 30 minutes of sitting to eat a meal. Outcome 3: Met    No Data Recorded No Data Recorded    No Data Recorded  No Data Recorded   # weeks: 6     LTG 1: Increase AROM:  Hip flexion to 85, extension to 10, abduction to 30 and ER to 20 degrees. Outcome 1: Met    LTG 2: Increase strength left LE by 1/2 muscle grade or better.   Outcome 2: Continued    LTG 3: [name] will be able to ambulate without assistive device on levels and negotiate stairs with use of handrail.  Outcome 3: Met    LTG 4: pt will be able to ambulate without compensatory gait pattern  Outcome 4: New    No Data Recorded  No Data Recorded      PLAN:     Continue PT per established plan of care. This note to serve as a Discharge Summary if Lachlan Pelto is discharged from therapy services.    Morrigan Wickens, PT  (60 mins billable )

## 2015-01-11 ENCOUNTER — Institutional Professional Consult (permissible substitution): Admit: 2015-01-11 | Payer: PRIVATE HEALTH INSURANCE | Attending: PT | Primary: MD

## 2015-01-11 DIAGNOSIS — M1612 Unilateral primary osteoarthritis, left hip: Secondary | ICD-10-CM

## 2015-01-11 NOTE — Treatment Summary (Signed)
PT Daily Note    Patient name: Nancy Richard Visits from [facility]: 24/24/25    Date of Birth: [birth year] Date: 01/11/2015   Start Time: 0735 Stop Time: 0835        Primary/Referral Dx: Left Hip Arthritis (s/p Left Hip Resurfacing 10/03/2014)  Treatment Dx: Nancy Richard;Muscle Weakness;Muscle Spasm;Pain in-Hip;Pain in joint-Hip;Stiffness joint-Hip     Next Note due to Physician: 11/17/14    Precautions:   Total Hip Precautions: No hip precautions     SUBJECTIVE:    Trying to work on decreasing limp while walking.    Pain Level: Pain Level  Pain Score at start of session: 2  Pain Score at end of session: 1      OBJECTIVE:     Ambulates with pelvic posterior rotation and excessive lumbar extension during left LE stance phase.     Treatment:    ? Therapeutic Exercise: See exercise flow sheet.  ? Therapeutic Activity:  Instructed how to apply kinesiotape I-strips to left quadriceps.     ASSESSMENT:   Will require review of how to apply kinesiotape. Extremely tight in hip adductors, flexors and rotators; weak hip extensors, core and ER.  Continues with gait dysfunction.    Patient continues to demonstrate limitations which require skilled PT Intervention to achieve the following goals:     PT Treatment Goals  # weeks: 4    STG 1: Reduce pain level to max 4/10. Outcome 1: Partially Met    STG 2: Reduce pain level to max 4/10. Outcome 2: Met    STG 3: Will tolerate at least 30 minutes of sitting to eat a meal. Outcome 3: Met    No Data Recorded No Data Recorded    No Data Recorded  No Data Recorded   # weeks: 6     LTG 1: Increase AROM:  Hip flexion to 85, extension to 10, abduction to 30 and ER to 20 degrees. Outcome 1: Met    LTG 2: Increase strength left LE by 1/2 muscle grade or better.  Outcome 2: Continued    LTG 3: Nancy Richard will be able to ambulate without assistive device on levels and negotiate stairs with use of handrail.  Outcome 3: Met    LTG 4: pt will be able to ambulate without compensatory gait pattern   Outcome 4: New    No Data Recorded  No Data Recorded      PLAN:     Perform recheck.  Continue PT per established plan of care. This note to serve as a Discharge Summary if Nancy Richard is discharged from therapy services.    Stoney Karczewski, PT  (60 mins billable )

## 2015-01-12 ENCOUNTER — Ambulatory Visit: Admit: 2015-01-12 | Discharge: 2015-01-12 | Payer: PRIVATE HEALTH INSURANCE | Primary: MD

## 2015-01-12 DIAGNOSIS — R5383 Other fatigue: Secondary | ICD-10-CM

## 2015-01-12 LAB — ESTRADIOL: Estradiol: 83 pg/mL

## 2015-01-12 LAB — COMPREHENSIVE METABOLIC PANEL
ALT - Alanine Amino transferase: 23 IU/L (ref 7–52)
AST - Aspartate Aminotransferase: 21 IU/L (ref 8–39)
Albumin/Globulin Ratio: 1.3 (ref >0.9)
Albumin: 3.9 gm/dL (ref 3.5–5.0)
Alkaline Phosphatase: 37 IU/L (ref 34–104)
Anion Gap: 7 mmol/L (ref 3–11)
BUN: 15 mg/dL (ref 6.0–23.0)
Bilirubin, Total: 0.4 mg/dL (ref 0.3–1.2)
CO2 - Carbon Dioxide: 26 mmol/L (ref 21.0–31.0)
Calcium: 9.1 mg/dL (ref 8.6–10.3)
Chloride: 103 mmol/L (ref 98–111)
Creatinine, Serum: 0.64 mg/dL (ref 0.55–1.10)
GFR Estimate: >60 mL/min/{1.73_m2} (ref >60)
Globulin: 2.9 gm/dL (ref 2.2–3.7)
Glucose: 84 mg/dL (ref 80–99)
Potassium: 3.7 mmol/L (ref 3.5–5.1)
Protein, Total: 6.8 gm/dL (ref 6.0–8.0)
Sodium: 136 mmol/L (ref 135–143)

## 2015-01-12 LAB — TESTOSTERONE, TOTAL AND FREE, SERUM (MAYO)
Testosterone, Free (ng/dL): 0.1 ng/dL (ref 0.06–0.87)
Testosterone, Total: 20 ng/dL (ref 8–60)

## 2015-01-12 LAB — T3, FREE: T3, Free: 3 pg/mL (ref 2.5–3.9)

## 2015-01-12 LAB — PROGESTERONE: Progesterone: 2.9 ng/mL (ref 0.1–18.6)

## 2015-01-12 LAB — DHEA-SULFATE PANEL: DHEA-Sulfate Panel: 112 microgm/dL (ref 8–188)

## 2015-01-12 LAB — CORONARY RISK LIPID PANEL
Cholesterol (CRISK): 180 mg/dL (ref <200)
Cholesterol, HDL: 82 mg/dL (ref 35–92)
LDL Calculated: 87 mg/dL (ref <100)
Triglycerides (Crisk): 55 mg/dL (ref 40–210)

## 2015-01-12 LAB — 25-OH HYDROXY VITAMIN D, CALCIFEROL, TOTAL OF D2 & D3: Vitamin D, 25-OH Total: 66 ng/mL (ref 30–100)

## 2015-01-12 LAB — TSH: TSH - Thyroid Stimulating Hormone: 0.74 microIU/mL (ref 0.40–5.80)

## 2015-01-12 LAB — INSULIN, TOTAL: Insulin: 2.2 microIU/mL (ref 2–23)

## 2015-01-12 LAB — T4, FREE: T4, Free: 0.9 ng/dL (ref 0.6–1.2)

## 2015-01-16 ENCOUNTER — Institutional Professional Consult (permissible substitution): Admit: 2015-01-16 | Payer: PRIVATE HEALTH INSURANCE | Attending: PT | Primary: MD

## 2015-01-16 DIAGNOSIS — M1612 Unilateral primary osteoarthritis, left hip: Secondary | ICD-10-CM

## 2015-01-16 NOTE — Discharge Summary (Signed)
[facility]  [facility]ILITATION SERVICES  [address]  [place] [address]  Dept: [phone]  Fax: [phone]  Loc: [phone]    Physical Therapy Discharge Summary    Patient Name: Nancy Richard Referring Provider: Delfina Redwood, M.D.   Date of Birth: 1956/02/12 PCP: Sherre Scarlet, MD   [facility] Date: 10/19/14      Primary Insurance: [employer]  Secondary Insurance: None Referral Date: 10/17/14    Visits from [facility]: 25 MRN: [id number]   Dates of Service: N/A: Discharge Report Gender:  female        Primary/Referral Dx: Left Hip Arthritis (s/p Left Hip Resurfacing 10/03/2014)  Treatment Dx: [name] Disturbance;Muscle Weakness;Muscle Spasm;Pain in-Hip;Pain in joint-Hip;Stiffness joint-Hip      Chief Complaint: Pt is planning on returring ot work next wk. She is still having quite a bit of pain and dysfunction w walking. She is sleeping better. She reports that most of her pain is medial thigh, distal lateral knee and L glutes. She is frustrated that she is not 100%.       PT Initial Problem List       11/25/2014    1. Presence of pain:  Max 4/10, best 1/10.    2. Disturbed sleep:  Awakens after 4-5 hours of sleep.  Has difficulty getting comfortable to fall asleep.    3. Unable to sit for moderate periods of time (max 30 minutes).    4. Decreased A/PROM left hip:  AROM flexion 80, extension neutral, abduction 25, ER 10 degrees.    5. Impaired strength left LE:  hip abd 3+/5, flexion 4/5, ext 3+/5    6. Inability to ambulate without compensatory movement pattern.         PT Treatment Goals  Short Term Goals (# weeks: 4) STG Outcomes   STG 1: Reduce pain level to max 4/10. Outcome 1: Partially Met    STG 2: Reduce pain level to max 4/10. Outcome 2: Met    STG 3: Will tolerate at least 30 minutes of sitting to eat a meal. Outcome 3: Met    Long Term Goals (# weeks: 6) LTG Outcomes    LTG 1: Increase AROM:  Hip flexion to 85, extension to 10, abduction to 30  and ER to 20 degrees. Outcome 1: Met    LTG 2: Increase strength left LE by 1/2 muscle grade or better.  Outcome 2: Met    LTG 3: Nancy Richard will be able to ambulate without assistive device on levels and negotiate stairs with use of handrail.  Outcome 3: Met    LTG 4: pt will be able to ambulate without compensatory gait pattern  Outcome 4: Partially Met      Justification for Continued Care: N/A: Discharge Report    Rehab Potential: N/A: Discharge Report  Treatment Interventions:   N/A:  Discharge Report    Precautions: Total Hip Precautions: No hip precautions   Comments:  See below.  Expected Frequency/Duration of Therapy:  Discharge: N/A  ________________________________________________________________________    Discharge Reason: Most goals achieved.  Recommendations: Continue to perform HEP.  Discharge Summary: Anthonella appears highly compliant with HEP.  She demonstrates extremely tight hip adductors, flexor and rotators and has specific exercises to address this problem. She also continues with gait dysfunction, but is working diligently on improving this.  She is tolerating her return to work well and has been able to garden too.  Thank you for this referral.    Irven Shelling, PT  01/16/2015

## 2015-01-16 NOTE — Treatment Summary (Signed)
PT Daily Note    Patient name: Nancy Richard Visits from [facility]: 25/25/25    Date of Birth: 01-Jun-1956 Date: 01/16/2015   Start Time: 0735 Stop Time: 0835        Primary/Referral Dx: Left Hip Arthritis (s/p Left Hip Resurfacing 10/03/2014)  Treatment Dx: [name] Disturbance;Muscle Weakness;Muscle Spasm;Pain in-Hip;Pain in joint-Hip;Stiffness joint-Hip     Next Note due to Physician: 11/17/14    Precautions:   Total Hip Precautions: No hip precautions     SUBJECTIVE:    Hip stretches are going well.  Feels ready for discharge.  States she understands how to use kinesiotape.    Pain Level: Pain Level  Pain Score at start of session: 1      OBJECTIVE:     Ambulates with pelvic posterior rotation and excessive lumbar extension during left LE stance phase.     Treatment:    ? Therapeutic Exercise: See exercise flow sheet.  Current HEP - Standing hip adductor stretch, core stabilization Sahrmann level 1B, prone over edge of bed and while laying on bed hip extension strengthening with knee extended and with knee flexed, hip release, quadriped hands and knees rocking limited excursion the increase hip joint flexibility and train to move in hip joint and not lumbar spine, prone hip rotation with knee flexed, prone knee flexion.    ASSESSMENT:   Continues with extremely tight hip adductors, flexors and rotators; weak hip extensors, core and ER and gait dysfunction.  But gait pattern is improving with less lumbopelvic posterior rotation during terminal stance.    Patient continues to demonstrate limitations which require skilled PT Intervention to achieve the following goals:     PT Treatment Goals  # weeks: 4    STG 1: Reduce pain level to max 4/10. Outcome 1: Partially Met    STG 2: Reduce pain level to max 4/10. Outcome 2: Met    STG 3: Will tolerate at least 30 minutes of sitting to eat a meal. Outcome 3: Met    No Data Recorded No Data Recorded    No Data Recorded  No Data Recorded   # weeks: 6     LTG 1: Increase AROM:   Hip flexion to 85, extension to 10, abduction to 30 and ER to 20 degrees. Outcome 1: Met    LTG 2: Increase strength left LE by 1/2 muscle grade or better.  Outcome 2: Met    LTG 3: Nancy Richard will be able to ambulate without assistive device on levels and negotiate stairs with use of handrail.  Outcome 3: Met    LTG 4: pt will be able to ambulate without compensatory gait pattern  Outcome 4: Partially Met    No Data Recorded  No Data Recorded      PLAN:     Ifrah Vest is discharged from therapy services.    Courtnei Ruddell, PT  (60 mins billable )

## 2015-03-27 ENCOUNTER — Other Ambulatory Visit: Payer: Self-pay | Admitting: Obstetrics and Gynecology

## 2015-03-27 LAB — HM PAP SMEAR: HM Pap smear: NEGATIVE

## 2015-03-28 LAB — CYTOLOGY - PAP

## 2015-05-10 ENCOUNTER — Encounter: Admit: 2015-05-10 | Discharge: 2015-05-10 | Attending: Family | Primary: MD

## 2015-05-17 ENCOUNTER — Encounter: Admit: 2015-05-17 | Discharge: 2015-05-18 | Attending: Adult Health | Primary: MD

## 2015-07-24 ENCOUNTER — Inpatient Hospital Stay: Admit: 2015-07-24 | Payer: PRIVATE HEALTH INSURANCE | Attending: MD | Primary: MD

## 2015-07-24 DIAGNOSIS — S62522A Displaced fracture of distal phalanx of left thumb, initial encounter for closed fracture: Secondary | ICD-10-CM

## 2015-08-01 ENCOUNTER — Institutional Professional Consult (permissible substitution): Admit: 2015-08-01 | Payer: PRIVATE HEALTH INSURANCE | Primary: MD

## 2015-08-01 DIAGNOSIS — S62522D Displaced fracture of distal phalanx of left thumb, subsequent encounter for fracture with routine healing: Secondary | ICD-10-CM

## 2015-08-01 NOTE — Plan of Care (Signed)
[facility]  [facility]HABILITATION SERVICES  [address]  [place] [address]  Dept: [phone]  Fax: [phone]  Loc: [phone]    Occupational Therapy Plan of Care     Patient name: Nancy Richard Referring Provider: Nonie Hoyer, PA - C   Date of birth: 1955/11/10 PCP: Sherre Scarlet, MD   MRN: [id number] Other Practitioner(s): none   Gender:  female Onset Date: 07/18/15  Referral Date: 08/01/15   Primary Insurance: [employer] Primary/Referral Dx: Distal Phalanx fx Left thumb   Visits from [facility]:  1 Treatment Dx: Edema-Hand;Pain in joint-Hand;Stiffness in joint-Hand      Chief Complaint: Pain and swelling in Left thumb     OT Initial Problem List       08/01/2015    1. Pain 3/10 constant aching up to 7/10 with mvmt or bumping    2. No AROM allowed Left D1 IP jt due to fx    3. Edema Left D1 IP jt 2cm greater than Right    4. Hypersensitivity Left D1 from IP jt distally volar and dorsal    5. No functional use of left thumb allowed due to comminuted fx distal phalanx         OT Treatment Goals  Short Term Goals (# weeks (STG): 4) STG Outcomes   STG 1: Decrease pt c/o pain left D1 to 2/10 with lite use  Outcome 1: New    STG 2: Improve AROM Left D1 IP to 0/45, D1 opp to DPC5 to 2.5cm  Outcome 2: New    STG 3: decrease edem Left D1 IP to no more than .5cm greater than Right  Outcome 3: New    STG 4: Pt will be able to tolerate touch/pressure/various texutres on left D1  Outcome 4: New    STG 5: Pt will be able to return to lite use of  D1 out of splint for ADL/work tasks  Outcome 5: New    Long Term Goals (# weeks (LTG): 8) LTG Outcomes   LTG 1: Pt will be able to self manage symptoms and progress ROM/strength/function independently with HEP Outcome 1: New    LTG 2: Pt will be able to return to 75% or greater of her prior Left hand use Outcome 2: New    Goals Reporting Period NA - New goals established           Justification for Care:  Pt. will require skilled  therapy intervention to achieve the goals listed above.  Rehab Potential: Good  Treatment Interventions : Therapeutic exercises/Home Program [phone];Mobilization/Manual therapy 97140;Therapeutic activities 97530;Splint Bracing;Cold Laser [id number]   Other: No AROM for 2 weeks   Comments: Pt will benefit from OT tx for splinting (which will require modification due to edema) as well as instruction in HEP and treatment to minimize complications and maximize return to functional use    Frequency # visits per week: 1   Duration # weeks: 8, or sooner if discharge criteria are met.   Certification Period From: 08/01/15  To (Re-cert or Discharge due): 09/29/14   Thank you for this referral.    Rebekah Sprinkle M. Mirha Brucato, OT  08/01/2015    Please sign below if you agree with the provided plan and return to the fax number listed above. Modifications/precautions are appreciated.     I certify the need for these services furnished under this plan of treatment and while under my care.  ______________________________________________________________________  (Provider signature)    ____________ ____________  (Date)   (Time)    (Patient Name: Nancy Richard)

## 2015-08-01 NOTE — Other (Signed)
OT Evaluation    Patient name: Mariateresa Batra Next report due to Physician: 08/31/15   Date of birth:  29-Oct-1955 Visits from [facility]:1   Primary/Referral Dx: Distal Phalanx fx Left thumb    SOC date:  08/01/2015 Time:  1100 - 1200       Precautions: Other: No AROM for 2 weeks      SUBJECTIVE  Pt reports she was moving a large rock in a stream with a crowbar when it flipped over and landed on her left thumb 2 wks ago.  She went to her PCP for an xray approx 1 wk ago and he referred her to Orthopedics. She saw them today and they recommended hand therapy for splinting.  She had been moving IP jt but was getting crunching sensation/sound with mvmt. Orthopedics recommended no thumb ROM for 2 weeks to allow pieces of fx to form calus as they were concerned that she was pulling on/moving comminuted pieces of fx with mvmt.  She is concerned about joint getting stuck as she had problem with a prior fx of D5.  Chief Complaint: Pain and swelling in Left thumb    Learning Style  Learning - Desire to Learn: Receptive  Learning - Preferred Method: Verbal;Written;Demonstration  Learning - Barriers: None  Pain Level  Pain Level  Pain at rest: 3  Pain with activity: 7    OBJECTIVE  Screen for abuse: Screened?: Yes     See problem list on flowsheet and in POC for specific measurements    Treatment:  Evaluation/POC, fabrication of splint for hand based D1 spica across the IP jt as well as tip cap for protection at work. Instruction in HEP for edema management and splint use/precautions    ASSESSMENT  Pt will benefit from OT tx for splinting (which will require modification due to edema) as well as instruction in HEP to minimize complications and maximize return to functional use    PLAN    Continue OT per established plan of care.     H & P Reviewed. Patient/Caregiver goals reviewed and integrated with rehab treatment plan.    Baird Kay, OT  (Billable Time: 60 minutes)

## 2015-08-07 ENCOUNTER — Institutional Professional Consult (permissible substitution): Admit: 2015-08-07 | Payer: PRIVATE HEALTH INSURANCE | Primary: MD

## 2015-08-07 DIAGNOSIS — S62525D Nondisplaced fracture of distal phalanx of left thumb, subsequent encounter for fracture with routine healing: Secondary | ICD-10-CM

## 2015-08-07 NOTE — Treatment Summary (Signed)
OT Treatment Note    Patient name: Nancy Richard Next report due to Physician: 08/31/15   Date of birth: 10/30/1955 Visits from [facility]: 2   Primary/Referral Dx: Distal Phalanx fx Left thumb Current auth visit #:    No Data Recorded  No Data Recorded     Date: 08/07/2015 Time:  1504 - 1527     Precautions: Other: No AROM for 2 weeks    SUBJECTIVE  I don't like the long splint, it compresses my nerve and I fight it when I use my hand.  C/O hypersensitivity on thumb dorsum  Pain Level: Pain Level  Pain at start of session: 1  Pain at rest: 1  Pain with activity: 4    OBJECTIVE      Treatment: HEP for self manual edema mobilization and desensitization  Splint assessment    ASSESSMENT  The  tip cap splint is working well, and patient does not fight it when she uses her hand. The splint seems to immobilize the fx and not cause nerve compression.   OT Treatment Goals  Short Term Goals (# weeks (STG): 4) STG Outcomes   STG 1: Decrease pt c/o pain left D1 to 2/10 with lite use  Outcome 1: New    STG 2: Improve AROM Left D1 IP to 0/45, D1 opp to DPC5 to 2.5cm  Outcome 2: New    STG 3: decrease edem Left D1 IP to no more than .5cm greater than Right  Outcome 3: New    STG 4: Pt will be able to tolerate touch/pressure/various texutres on left D1  Outcome 4: New    STG 5: Pt will be able to return to lite use of  D1 out of splint for ADL/work tasks  Outcome 5: New    No Data Recorded  No Data Recorded    Long Term Goals (# weeks (LTG): 8) LTG Outcomes   LTG 1: Pt will be able to self manage symptoms and progress ROM/strength/function independently with HEP Outcome 1: New    LTG 2: Pt will be able to return to 75% or greater of her prior Left hand use Outcome 2: New    No Data Recorded  No Data Recorded    No Data Recorded  No Data Recorded    Goals Reporting Period No Data Recorded   These goals have been integrated into the current treatment plan and [name] continues to demonstrate limitations which require  skilled OT intervention.    PLAN  Patient has cancelled the next few appts and does not want to come in until the first week in January.  Patient's PA advises the patient to begin moving the thumb tip at 4 weeks; she will begin light blocking ex of the thumb IP at that time and see OT the following week to assess and possibly modify the HEP at that time.  Continue OT per established plan of care.     Yasaman Kolek Dennie Bible, OT  (Billable Time: 23 minutes)

## 2015-08-09 ENCOUNTER — Encounter: Payer: PRIVATE HEALTH INSURANCE | Primary: MD

## 2015-08-23 ENCOUNTER — Encounter: Payer: PRIVATE HEALTH INSURANCE | Primary: MD

## 2015-08-28 ENCOUNTER — Encounter: Payer: PRIVATE HEALTH INSURANCE | Primary: MD

## 2015-08-29 ENCOUNTER — Encounter: Payer: PRIVATE HEALTH INSURANCE | Primary: MD

## 2015-08-29 ENCOUNTER — Institutional Professional Consult (permissible substitution): Admit: 2015-08-29 | Payer: PRIVATE HEALTH INSURANCE | Primary: MD

## 2015-08-29 DIAGNOSIS — S62525D Nondisplaced fracture of distal phalanx of left thumb, subsequent encounter for fracture with routine healing: Secondary | ICD-10-CM

## 2015-08-29 NOTE — Treatment Summary (Signed)
OT Treatment Note    Patient name: Nancy Richard Next report due to Physician: 09/29/15   Date of birth: 1956-05-23 Visits from [facility]: 3   Primary/Referral Dx: Distal Phalanx fx Left thumb Current auth visit #:    No Data Recorded  No Data Recorded     Date: 08/29/2015 Time:  1259 - 1330     Precautions: Other: No AROM for 2 weeks    SUBJECTIVE  I don't think I need any more visits; I'll work on my exercises on my own and call you if needed.  Pain Level: Pain Level  Pain at start of session: 0 - No Pain  Pain at rest: 0 - No Pain  Pain with activity: 2 (When patient holds a pen, SPL is )    OBJECTIVE  re assessed for PN    Treatment: HEP for self manual edema mobilization and desensitization  Splint assessment    ASSESSMENT  good progress noted  OT Treatment Goals  Short Term Goals (# weeks (STG): 4) STG Outcomes   STG 1: Decrease pt c/o pain left D1 to 2/10 with lite use  Outcome 1: Met    STG 2: Improve AROM Left D1 IP to 0/45, D1 opp to [facility] to 2.5cm  Outcome 2: Met (2.0 tip to [facility];  D1 IP 0/55 (blocked))    STG 3: decrease edem Left D1 IP to no more than .5cm greater than Right  Outcome 3: Met (Rt 6 cm, Lt 6.5cm at spot just distal to IP jt)    STG 4: Pt will be able to tolerate touch/pressure/various texutres on left D1  Outcome 4: Continued (patient has just this week begun to use the thumb tip)    STG 5: Pt will be able to return to lite use of  D1 out of splint for ADL/work tasks  Outcome 5: Met    No Data Recorded  No Data Recorded    Long Term Goals (# weeks (LTG): 8) LTG Outcomes   LTG 1: Pt will be able to self manage symptoms and progress ROM/strength/function independently with HEP Outcome 1: Continued (HEP changing as needed)    LTG 2: Pt will be able to return to 75% or greater of her prior Left hand use Outcome 2: Continued (see above)    No Data Recorded  No Data Recorded    No Data Recorded  No Data Recorded    Goals Reporting Period No Data Recorded   These goals have been integrated into the  current treatment plan and Nancy Richard continues to demonstrate limitations which require skilled OT intervention.    PLAN  all appts on hold; if patient does not call for appt in 30 days, will DC.  Continue OT per established plan of care.     Kizzy Olafson Dennie Bible, OT  (Billable Time: 31 minutes)

## 2015-08-29 NOTE — Discharge Summary (Signed)
[facility]  [facility]IL[facility] SERVICES  [address]  [place] [address]  Dept: [phone]  Fax: [phone]  Loc: [phone]    Occupational Therapy Discharge Summary    Patient name: Nancy Richard Referring Provider: Nonie Hoyer, PA - C   Date of birth: 01/05/1956 PCP: Sherre Scarlet, MD   MRN: [id number] Other Practitioner(s): none    Gender:  female Onset Date: 07/18/15  Referral Date: 08/01/15   Primary Insurance: [employer] Primary/Referral Dx: Distal Phalanx fx Left thumb   Visits from [facility]:  3 Treatment Dx: Edema-Hand;Pain in joint-Hand;Stiffness in joint-Hand      Chief Complaint: Pain and swelling in Left thumb     OT Initial Problem List       08/01/2015    1. Pain 3/10 constant aching up to 7/10 with mvmt or bumping    2. No AROM allowed Left D1 IP jt due to fx    3. Edema Left D1 IP jt 2cm greater than Right    4. Hypersensitivity Left D1 from IP jt distally volar and dorsal    5. No functional use of left thumb allowed due to comminuted fx distal phalanx         OT Treatment Goals  Short Term Goals (# weeks (STG): 4) STG Outcomes   STG 1: Decrease pt c/o pain left D1 to 2/10 with lite use  Outcome 1: Met    STG 2: Improve AROM Left D1 IP to 0/45, D1 opp to [facility] to 2.5cm  Outcome 2: Met (2.0 tip to [facility];  D1 IP 0/55 (blocked))    STG 3: decrease edem Left D1 IP to no more than .5cm greater than Right  Outcome 3: Met (Rt 6 cm, Lt 6.5cm at spot just distal to IP jt)    STG 4: Pt will be able to tolerate touch/pressure/various texutres on left D1  Outcome 4: Partially Met (as of last visit, goal not fully met)    STG 5: Pt will be able to return to lite use of  D1 out of splint for ADL/work tasks  Outcome 5: Met    No Data Recorded  No Data Recorded    Long Term Goals (# weeks (LTG): 8) LTG Outcomes   LTG 1: Pt will be able to self manage symptoms and progress ROM/strength/function independently with HEP Outcome 1: Met (patient has been compliant with  HEP)    LTG 2: Pt will be able to return to 75% or greater of her prior Left hand use Outcome 2: Partially Met (as of last visit, goal not fully met)    No Data Recorded  No Data Recorded    No Data Recorded  No Data Recorded    Goals Reporting Period From: 08/01/15, To: 08/29/15           Other: No AROM for 2 weeks   Comments: see below    Discharge reason:  patient is indep with HEP and has not scheduled further appts  Recommendations:  cont with HEP as needed  Discharge Summary:  patient progressed well in OT; one month ago she chose to put OT on hold and just work on HEP with the plan to call for an appt if needed. She has not scheduled further appts.    Thank you for this referral.  This Discharge Summary is being provided for your records, and does not require a signature.    Indie Boehne S.  Keah Lamba, OT  09/29/2015

## 2015-08-29 NOTE — Progress Notes (Signed)
[facility]  [facility]  [address]  [place] [address]  Dept: [phone]  Fax: [phone]  Loc: [phone]    Occupational Therapy Progress Report    Patient name: Nancy Richard Referring Provider: Nonie Hoyer, PA - C   Date of birth: 1956/06/22 PCP: Sherre Scarlet, MD   MRN: [id number] Other Practitioner(s): none   Gender:  female Onset Date: 07/18/15  Referral Date: 08/01/15   Primary Insurance: [employer] Primary/Referral Dx: Distal Phalanx fx Left thumb   Visits from [facility]:  3 Treatment Dx: Edema-Hand;Pain in joint-Hand;Stiffness in joint-Hand      Chief Complaint: Pain and swelling in Left thumb     OT Initial Problem List       08/01/2015    1. Pain 3/10 constant aching up to 7/10 with mvmt or bumping    2. No AROM allowed Left D1 IP jt due to fx    3. Edema Left D1 IP jt 2cm greater than Right    4. Hypersensitivity Left D1 from IP jt distally volar and dorsal    5. No functional use of left thumb allowed due to comminuted fx distal phalanx         OT Treatment Goals  Short Term Goals (# weeks (STG): 4) STG Outcomes   STG 1: Decrease pt c/o pain left D1 to 2/10 with lite use  Outcome 1: Met    STG 2: Improve AROM Left D1 IP to 0/45, D1 opp to [facility] to 2.5cm  Outcome 2: Met (2.0 tip to [facility];  D1 IP 0/55 (blocked))    STG 3: decrease edem Left D1 IP to no more than .5cm greater than Right  Outcome 3: Met (Rt 6 cm, Lt 6.5cm at spot just distal to IP jt)    STG 4: Pt will be able to tolerate touch/pressure/various texutres on left D1  Outcome 4: Continued (patient has just this week begun to use the thumb tip)    STG 5: Pt will be able to return to lite use of  D1 out of splint for ADL/work tasks  Outcome 5: Met    No Data Recorded  No Data Recorded    Long Term Goals (# weeks (LTG): 8) LTG Outcomes   LTG 1: Pt will be able to self manage symptoms and progress ROM/strength/function independently with HEP Outcome 1: Continued (HEP changing as  needed)    LTG 2: Pt will be able to return to 75% or greater of her prior Left hand use Outcome 2: Continued (see above)    No Data Recorded  No Data Recorded    No Data Recorded  No Data Recorded    Goals Reporting Period No Data Recorded           Justification for Care:  Pt has made good progress in all areas and is ready for HEP on her own. Pt will require skilled care for strengthening to return to full duty tasks at work, ie writing and lifting with Lt hand.    Rehab Potential: Good  Treatment Interventions : Therapeutic exercises/Home Program [phone];Mobilization/Manual therapy 97140;Therapeutic activities 97530;Splint Bracing;Cold Laser [phone] (signed)  Other: No AROM for 2 weeks   Comments: see above    Frequency # visits per week: 1   Duration # weeks: 4, or sooner if discharge criteria are met.   Certification Period (signed)  From: 08/01/15  To (Re-cert or Discharge due): 09/29/14  Thank you for this referral.    Alexys Lobello S. Garfield Coiner, OT  08/29/2015    This progress note is provided as an update report only, and does not require a signature.

## 2015-09-06 ENCOUNTER — Encounter: Payer: PRIVATE HEALTH INSURANCE | Primary: MD

## 2015-09-12 ENCOUNTER — Encounter: Payer: PRIVATE HEALTH INSURANCE | Primary: MD

## 2015-09-20 ENCOUNTER — Encounter: Payer: PRIVATE HEALTH INSURANCE | Primary: MD

## 2015-09-25 ENCOUNTER — Encounter: Payer: PRIVATE HEALTH INSURANCE | Primary: MD

## 2015-10-04 ENCOUNTER — Encounter: Payer: PRIVATE HEALTH INSURANCE | Primary: MD

## 2015-10-26 ENCOUNTER — Encounter: Primary: MD

## 2015-11-07 ENCOUNTER — Other Ambulatory Visit: Admit: 2015-11-07 | Discharge: 2015-11-07 | Payer: PRIVATE HEALTH INSURANCE | Primary: MD

## 2015-11-07 DIAGNOSIS — R5383 Other fatigue: Secondary | ICD-10-CM

## 2015-11-07 LAB — TESTOSTERONE, TOTAL AND FREE, SERUM (MAYO)
Testosterone, Free: 0.14 ng/dL (ref 0.06–0.87)
Testosterone, Total: 20 ng/dL (ref 8–60)

## 2015-11-07 LAB — CBC WITH AUTO DIFFERENTIAL
Basophils %: 0 % (ref 0–2)
Basophils, Absolute: 0 10*3/ÂµL (ref 0.0–0.2)
Eosinophils %: 3 % (ref 0–7)
Eosinophils, Absolute: 0.2 10*3/ÂµL (ref 0.0–0.7)
HCT: 40.9 % (ref 37.0–48.0)
Hemoglobin: 14.1 g/dL (ref 12.0–16.0)
Lymphocytes %: 17 % — ABNORMAL LOW (ref 25–45)
Lymphocytes, Absolute: 1.4 10*3/ÂµL (ref 1.1–4.3)
MCH: 31.3 pg (ref 27.0–34.0)
MCHC: 34.4 g/dL (ref 32.0–36.0)
MCV: 91 fL (ref 81.0–99.0)
MPV: 10.1 fL (ref 7.4–10.4)
Monocytes %: 10 % (ref 0–12)
Monocytes, Absolute: 0.8 10*3/ÂµL (ref 0.0–1.2)
Neutrophils %: 71 % — ABNORMAL HIGH (ref 35–70)
Neutrophils, Absolute: 6 10*3/ÂµL (ref 1.6–7.3)
Platelet Count: 234 10*3/ÂµL (ref 150–400)
RBC: 4.5 10*6/ÂµL (ref 4.20–5.40)
RDW: 13.2 % (ref 11.5–14.5)
WBC: 8.6 10*3/ÂµL (ref 4.8–10.8)

## 2015-11-07 LAB — T4, FREE: T4, Free: 0.9 ng/dL (ref 0.6–1.2)

## 2015-11-07 LAB — COMPREHENSIVE METABOLIC PANEL
ALT - Alanine Aminotransferase: 20 IU/L (ref 7–52)
AST - Aspartate Aminotransferase: 19 IU/L (ref 8–39)
Albumin/Globulin Ratio: 1.5 (ref >0.9)
Albumin: 3.8 g/dL (ref 3.5–5.0)
Alkaline Phosphatase: 44 IU/L (ref 34–104)
Anion Gap: 9.9 mmol/L (ref 3.0–11.0)
BUN: 19 mg/dL (ref 6–23)
Bilirubin Total: 0.3 mg/dL (ref 0.3–1.2)
CO2 - Carbon Dioxide: 26.1 mmol/L (ref 21.0–31.0)
Calcium: 9.2 mg/dL (ref 8.6–10.3)
Chloride: 104 mmol/L (ref 98–111)
Creatinine: 0.65 mg/dL (ref 0.55–1.10)
GFR Estimate: >60 mL/min/{1.73_m2} (ref >=60)
Globulin: 2.6 g/dL (ref 2.2–3.7)
Glucose: 98 mg/dL (ref 80–99)
Potassium: 3.9 mmol/L (ref 3.5–5.1)
Protein Total: 6.4 g/dL (ref 6.0–8.0)
Sodium: 140 mmol/L (ref 135–143)

## 2015-11-07 LAB — CORONARY RISK LIPID PANEL
Cholesterol, HDL: 73 mg/dL (ref >=40)
Cholesterol: 177 mg/dL (ref <200)
LDL Calculated: 87 mg/dL (ref <100)
Triglyceride: 83 mg/dL (ref 30–149)

## 2015-11-07 LAB — T3, FREE: T3, Free: 2.9 pg/mL (ref 2.5–3.9)

## 2015-11-07 LAB — TSH: TSH - Thyroid Stimulating Hormone: 1.31 u[IU]/mL (ref 0.34–5.60)

## 2015-11-07 LAB — 25-OH HYDROXY VITAMIN D, CALCIFEROL, TOTAL OF D2 & D3: Vitamin D, 25-OH Total: 52.8 ng/mL (ref 30.0–100.0)

## 2015-11-07 LAB — ESTRADIOL: Estradiol: 101 pg/mL

## 2015-11-07 LAB — DHEA-SULFATE PANEL: DHEA-Sulfate Panel: 104 ug/dL (ref 12–133)

## 2015-11-07 LAB — INSULIN, TOTAL: Insulin: 4.8 u[IU]/mL (ref 2.0–23.0)

## 2015-11-07 LAB — PROGESTERONE: Progesterone: 3.4 ng/mL

## 2015-11-07 LAB — SEX HORMONE BINDING GLOBULIN: Sex Hormone Binding Globulin: 132 nmol/L

## 2015-11-15 ENCOUNTER — Encounter: Admit: 2015-11-15 | Discharge: 2015-11-15 | Attending: Adult Health | Primary: MD

## 2015-11-15 DIAGNOSIS — Z7689 Persons encountering health services in other specified circumstances: Secondary | ICD-10-CM

## 2015-11-15 NOTE — Progress Notes (Signed)
See scanned documents.

## 2016-03-04 LAB — HEMOGLOBIN A1C: Hemoglobin A1C: 5.5

## 2016-05-16 NOTE — Telephone Encounter (Signed)
I returned Nancy Richard phone call regarding scheduling a biometric screening appointment. No one answered so I left a voicemail to give [place] a call back to schedule an appointment.

## 2016-10-25 ENCOUNTER — Other Ambulatory Visit: Admit: 2016-10-25 | Discharge: 2016-10-25 | Payer: PRIVATE HEALTH INSURANCE | Primary: MD

## 2016-10-25 DIAGNOSIS — G4700 Insomnia, unspecified: Secondary | ICD-10-CM

## 2016-10-25 LAB — CBC WITH AUTO DIFFERENTIAL
Basophils %: 1 % (ref 0–2)
Basophils, Absolute: 0.1 10*3/ÂµL (ref 0.0–0.2)
Eosinophils %: 3 % (ref 0–7)
Eosinophils, Absolute: 0.2 10*3/ÂµL (ref 0.0–0.7)
HCT: 42.9 % (ref 37.0–48.0)
Hemoglobin: 14.3 g/dL (ref 12.0–16.0)
Lymphocytes %: 21 % — ABNORMAL LOW (ref 25–45)
Lymphocytes, Absolute: 1.4 10*3/ÂµL (ref 1.1–4.3)
MCH: 31.3 pg (ref 27.0–34.0)
MCHC: 33.4 g/dL (ref 32.0–36.0)
MCV: 93.7 fL (ref 81.0–99.0)
MPV: 11.4 fL — ABNORMAL HIGH (ref 7.4–10.4)
Monocytes %: 9 % (ref 0–12)
Monocytes, Absolute: 0.6 10*3/ÂµL (ref 0.0–1.2)
Neutrophils %: 67 % (ref 35–70)
Neutrophils, Absolute: 4.4 10*3/ÂµL (ref 1.6–7.3)
Platelet Count: 191 10*3/ÂµL (ref 150–400)
RBC: 4.58 10*6/ÂµL (ref 4.20–5.40)
RDW: 13.8 % (ref 11.5–14.5)
WBC: 6.5 10*3/ÂµL (ref 4.8–10.8)

## 2016-10-25 LAB — COMPREHENSIVE METABOLIC PANEL
ALT - Alanine Aminotransferase: 20 IU/L (ref 7–52)
AST - Aspartate Aminotransferase: 19 IU/L (ref 8–39)
Albumin/Globulin Ratio: 1.6 (ref >0.9)
Albumin: 4.1 g/dL (ref 3.5–5.0)
Alkaline Phosphatase: 36 IU/L (ref 34–104)
Anion Gap: 6.3 mmol/L (ref 3.0–11.0)
BUN: 16 mg/dL (ref 6–23)
Bilirubin Total: 0.7 mg/dL (ref 0.3–1.2)
CO2 - Carbon Dioxide: 27.7 mmol/L (ref 21.0–31.0)
Calcium: 9.3 mg/dL (ref 8.6–10.3)
Chloride: 106 mmol/L (ref 98–111)
Creatinine: 0.66 mg/dL (ref 0.55–1.10)
GFR Estimate: >60 mL/min/{1.73_m2} (ref >=60)
Globulin: 2.5 g/dL (ref 2.2–3.7)
Glucose: 102 mg/dL — ABNORMAL HIGH (ref 80–99)
Potassium: 4.1 mmol/L (ref 3.5–5.1)
Protein Total: 6.6 g/dL (ref 6.0–8.0)
Sodium: 140 mmol/L (ref 135–143)

## 2016-10-25 LAB — T4, FREE: T4, Free: 0.7 ng/dL (ref 0.6–1.2)

## 2016-10-25 LAB — CORONARY RISK LIPID PANEL
Cholesterol, HDL: 85 mg/dL (ref >=40)
Cholesterol: 203 mg/dL — ABNORMAL HIGH (ref <200)
LDL Calculated: 104 mg/dL — ABNORMAL HIGH (ref <100)
Triglyceride: 68 mg/dL (ref 30–149)

## 2016-10-25 LAB — ESTRADIOL: Estradiol: 164 pg/mL

## 2016-10-25 LAB — TSH: TSH - Thyroid Stimulating Hormone: 0.97 u[IU]/mL (ref 0.45–5.33)

## 2016-10-25 LAB — T3, FREE: T3, Free: 2.7 pg/mL (ref 2.5–3.9)

## 2016-10-25 LAB — DHEA-SULFATE PANEL: DHEA-Sulfate Panel: 104 ug/dL (ref 12–133)

## 2016-10-25 LAB — SEX HORMONE BINDING GLOBULIN: Sex Hormone Binding Globulin: 151 nmol/L — ABNORMAL HIGH

## 2016-10-25 LAB — TESTOSTERONE, TOTAL AND FREE, SERUM (MAYO)
Testosterone, Free: 0.16 ng/dL (ref 0.06–0.87)
Testosterone, Total: 31 ng/dL (ref 8–60)

## 2016-10-25 LAB — INSULIN, TOTAL: Insulin: 2.5 u[IU]/mL (ref 2.0–23.0)

## 2016-10-25 LAB — PROGESTERONE: Progesterone: 4.3 ng/mL

## 2016-10-25 LAB — 25-OH HYDROXY VITAMIN D, CALCIFEROL, TOTAL OF D2 & D3: Vitamin D, 25-OH Total: 67.8 ng/mL (ref 30.0–100.0)

## 2017-06-24 ENCOUNTER — Encounter: Admit: 2017-06-24 | Discharge: 2017-06-24 | Attending: Family | Primary: MD

## 2017-06-24 DIAGNOSIS — Z23 Encounter for immunization: Secondary | ICD-10-CM

## 2017-06-24 NOTE — Patient Instructions (Signed)
Vaccine Information Statement    Influenza (Flu) Vaccine (Inactivated or Recombinant): What you need to know    Many Vaccine Information Statements are available in Spanish and other languages. See www.immunize.org/vis  Hojas de Información Sobre Vacunas están disponibles en Español y en muchos otros idiomas. Visite www.immunize.org/vis    1. Why get vaccinated?    Influenza (“flu”) is a contagious disease that spreads around the [place] every year, usually between October and May.     Flu is caused by influenza viruses, and is spread mainly by coughing, sneezing, and close contact.     Anyone can get flu. Flu strikes suddenly and can last several days. Symptoms vary by age, but can include:  • fever/chills  • sore throat  • muscle aches  • fatigue  • cough  • headache   • runny or stuffy nose    Flu can also lead to pneumonia and blood infections, and cause diarrhea and seizures in children.  If you have a medical condition, such as heart or lung disease, flu can make it worse.    Flu is more dangerous for some people. Infants and young children, people 65 years of age and older, pregnant women, and people with certain health conditions or a weakened immune system are at greatest risk.      Each year thousands of people in the [place] die from flu, and many more are hospitalized.     Flu vaccine can:  • keep you from getting flu,  • make flu less severe if you do get it, and  • keep you from spreading flu to your family and other people.     2. Inactivated and recombinant flu vaccines    A dose of flu vaccine is recommended every flu season. Children 6 months through 8 years of age may need two doses during the same flu season.  Everyone else needs only one dose each flu season.       Some inactivated flu vaccines contain a very small amount of a mercury-based preservative called thimerosal. Studies have not shown thimerosal in vaccines to be harmful, but flu vaccines that do not contain thimerosal are  available.    There is no live flu virus in flu shots.  They cannot cause the flu.     There are many flu viruses, and they are always changing. Each year a new flu vaccine is made to protect against three or four viruses that are likely to cause disease in the upcoming flu season. But even when the vaccine doesn’t exactly match these viruses, it may still provide some protection    Flu vaccine cannot prevent:  • flu that is caused by a virus not covered by the vaccine, or  • illnesses that look like flu but are not.    It takes about 2 weeks for protection to develop after vaccination, and protection lasts through the flu season.     3. Some people should not get this vaccine    Tell the person who is giving you the vaccine:    • If you have any severe, life-threatening allergies.    If you ever had a life-threatening allergic reaction after a dose of flu vaccine, or have a severe allergy to any part of this vaccine, you may be advised not to get vaccinated.  Most, but not all, types of flu vaccine contain a small amount of egg protein.       • If you   ever had Guillain-Barré Syndrome (also called GBS).   Some people with a history of GBS should not get this vaccine. This should be discussed with your doctor.    • If you are not feeling well.    It is usually okay to get flu vaccine when you have a mild illness, but you might be asked to come back when you feel better.      4. Risks of a vaccine reaction    With any medicine, including vaccines, there is a chance of reactions. These are usually mild and go away on their own, but serious reactions are also possible.     Most people who get a flu shot do not have any problems with it.     Minor problems following a flu shot include:   • soreness, redness, or swelling where the shot was given    • hoarseness  • sore, red or itchy eyes  • cough  • fever  • aches  • headache  • itching  • fatigue  If these problems occur, they usually begin soon after the shot and last 1  or 2 days.     More serious problems following a flu shot can include the following:    • There may be a small increased risk of Guillain-Barré Syndrome (GBS) after inactivated flu vaccine.  This risk has been estimated at 1 or 2 additional cases per million people vaccinated. This is much lower than the risk of severe complications from flu, which can be prevented by flu vaccine.      • Young children who get the flu shot along with pneumococcal vaccine (PCV13) and/or DTaP vaccine at the same time might be slightly more likely to have a seizure caused by fever. Ask your doctor for more information. Tell your doctor if a child who is getting flu vaccine has ever had a seizure.     Problems that could happen after any injected vaccine:     • People sometimes faint after a medical procedure, including vaccination. Sitting or lying down for about 15 minutes can help prevent fainting, and injuries caused by a fall. Tell your doctor if you feel dizzy, or have vision changes or ringing in the ears.    • Some people get severe pain in the shoulder and have difficulty moving the arm where a shot was given. This happens very rarely.    • Any medication can cause a severe allergic reaction. Such reactions from a vaccine are very rare, estimated at about 1 in a million doses, and would happen within a few minutes to a few hours after the vaccination.    As with any medicine, there is a very remote chance of a vaccine causing a serious injury or death.    The safety of vaccines is always being monitored. For more information, visit: www.cdc.gov/vaccinesafety/    5. What if there is a serious reaction?    What should I look for?    • Look for anything that concerns you, such as signs of a severe allergic reaction, very high fever, or unusual behavior.    Signs of a severe allergic reaction can include hives, swelling of the face and throat, difficulty breathing, a fast heartbeat, dizziness, and weakness - usually within a few  minutes to a few hours after the vaccination.    What should I do?    • If you think it is a severe allergic reaction or other emergency that   can’t wait, call 9-1-1 and get the person to the nearest hospital. Otherwise, call your doctor.    • Reactions should be reported to the Vaccine Adverse Event Reporting System (VAERS). Your doctor should file this report, or you can do it yourself through  the VAERS web site at www.vaers.hhs.gov, or by calling 1-800-822-7967.    VAERS does not give medical advice.    6. The National Vaccine Injury Compensation Program    The National Vaccine Injury Compensation Program (VICP) is a federal program that was created to compensate people who may have been injured by certain vaccines.    Persons who believe they may have been injured by a vaccine can learn about the program and about filing a claim by calling 1-800-338-2382 or visiting the VICP website at www.hrsa.gov/vaccinecompensation.  There is a time limit to file a claim for compensation.    7. How can I learn more?  • Ask your healthcare provider. He or she can give you the vaccine package insert or suggest other sources of information.  • Call your local or state health department.  • Contact the Centers for Disease Control and Prevention (CDC):  - Call 1-800-232-4636 (1-800-CDC-INFO) or  - Visit CDC’s website at www.cdc.gov/flu    Vaccine Information Statement   Inactivated Influenza Vaccine   04/01/2014  42 U.S.C. § 300aa-26    Department of Health and Human Services  Centers for Disease Control and Prevention    Office Use Only

## 2017-06-24 NOTE — Progress Notes (Signed)
Immunizations     Name Date Dose VIS Date Route    Flu Quadrivalent Injectable (PF) 06/24/2017 0.5 mL 04/01/2014 Intramuscular    Site: Left deltoid    Given By: Alice Burnside, CMA    Documented By: Jovee Dettinger, CMA    Manufacturer: [employer]    Lot: [id number]    Expiration Date: 02/22/2018

## 2017-07-01 ENCOUNTER — Inpatient Hospital Stay: Admit: 2017-07-01 | Payer: PRIVATE HEALTH INSURANCE | Attending: MD | Primary: MD

## 2017-07-01 DIAGNOSIS — S92512A Displaced fracture of proximal phalanx of left lesser toe(s), initial encounter for closed fracture: Secondary | ICD-10-CM

## 2017-07-29 ENCOUNTER — Other Ambulatory Visit: Admit: 2017-07-29 | Discharge: 2017-07-29 | Payer: PRIVATE HEALTH INSURANCE | Primary: MD

## 2017-07-29 DIAGNOSIS — R5383 Other fatigue: Secondary | ICD-10-CM

## 2017-07-29 LAB — T3, FREE: T3, Free: 3.2 pg/mL (ref 2.5–3.9)

## 2017-07-29 LAB — COMPREHENSIVE METABOLIC PANEL
ALT - Alanine Aminotransferase: 26 IU/L (ref 7–52)
AST - Aspartate Aminotransferase: 27 IU/L (ref 8–39)
Albumin/Globulin Ratio: 1.6 (ref >0.9)
Albumin: 4.4 g/dL (ref 3.5–5.0)
Alkaline Phosphatase: 47 IU/L (ref 34–104)
Anion Gap: 10 mmol/L (ref 4–13)
BUN: 13 mg/dL (ref 6–23)
Bilirubin Total: 0.6 mg/dL (ref 0.3–1.2)
CO2 - Carbon Dioxide: 27 mmol/L (ref 21–31)
Calcium: 9.5 mg/dL (ref 8.6–10.3)
Chloride: 104 mmol/L (ref 98–111)
Creatinine: 0.61 mg/dL (ref 0.55–1.10)
GFR Estimate: >60 mL/min/{1.73_m2} (ref >=60)
Globulin: 2.7 g/dL (ref 2.2–3.7)
Glucose: 91 mg/dL (ref 80–99)
Potassium: 3.7 mmol/L (ref 3.5–5.1)
Protein Total: 7.1 g/dL (ref 6.0–8.0)
Sodium: 141 mmol/L (ref 135–143)

## 2017-07-29 LAB — CBC WITH AUTO DIFFERENTIAL
Basophils %: 1 % (ref 0–2)
Basophils, Absolute: 0.1 10*3/ÂµL (ref 0.0–0.2)
Eosinophils %: 4 % (ref 0–7)
Eosinophils, Absolute: 0.3 10*3/ÂµL (ref 0.0–0.7)
HCT: 45.8 % (ref 37.0–48.0)
Hemoglobin: 15.3 g/dL (ref 12.0–16.0)
Lymphocytes %: 21 % — ABNORMAL LOW (ref 25–45)
Lymphocytes, Absolute: 1.5 10*3/ÂµL (ref 1.1–4.3)
MCH: 31.1 pg (ref 27.0–34.0)
MCHC: 33.4 g/dL (ref 32.0–36.0)
MCV: 93.2 fL (ref 81.0–99.0)
MPV: 11.4 fL — ABNORMAL HIGH (ref 7.4–10.4)
Monocytes %: 9 % (ref 0–12)
Monocytes, Absolute: 0.6 10*3/ÂµL (ref 0.0–1.2)
Neutrophils %: 66 % (ref 35–70)
Neutrophils, Absolute: 4.9 10*3/ÂµL (ref 1.6–7.3)
Platelet Count: 203 10*3/ÂµL (ref 150–400)
RBC: 4.91 10*6/ÂµL (ref 4.20–5.40)
RDW: 13.6 % (ref 11.5–14.5)
WBC: 7.3 10*3/ÂµL (ref 4.8–10.8)

## 2017-07-29 LAB — CORONARY RISK LIPID PANEL
Cholesterol, HDL: 83 mg/dL (ref >=40)
Cholesterol: 203 mg/dL — ABNORMAL HIGH (ref <200)
LDL Calculated: 108 mg/dL — ABNORMAL HIGH (ref <100)
Triglyceride: 58 mg/dL (ref 30–149)

## 2017-07-29 LAB — SEX HORMONE BINDING GLOBULIN: Sex Hormone Binding Globulin: 148 nmol/L — ABNORMAL HIGH

## 2017-07-29 LAB — INSULIN, TOTAL: Insulin: 3.9 u[IU]/mL (ref 2.0–23.0)

## 2017-07-29 LAB — ESTRADIOL: Estradiol: 100 pg/mL

## 2017-07-29 LAB — T4, FREE: T4, Free: 0.9 ng/dL (ref 0.6–1.2)

## 2017-07-29 LAB — TSH: TSH - Thyroid Stimulating Hormone: 1.37 u[IU]/mL (ref 0.45–5.33)

## 2017-07-29 LAB — 25-OH HYDROXY VITAMIN D, CALCIFEROL, TOTAL OF D2 & D3: Vitamin D, 25-OH Total: 73.8 ng/mL (ref 30.0–100.0)

## 2017-07-29 LAB — PROGESTERONE: Progesterone: 3.3 ng/mL

## 2017-07-29 LAB — TESTOSTERONE, TOTAL AND FREE, SERUM (MAYO)
Testosterone, Free: 0.13 ng/dL (ref 0.06–0.87)
Testosterone, Total: 21 ng/dL (ref 8–60)

## 2017-07-29 LAB — DHEA-SULFATE PANEL: DHEA-Sulfate Panel: 127 ug/dL (ref 12–133)

## 2017-07-30 NOTE — Telephone Encounter (Signed)
Nancy Richard states she had discussion with her provider at [facility]; they agreed if she feels her foot pain is improving, the can return to [facility] for a new note so she can return sooner. F/U is early January.

## 2017-08-27 ENCOUNTER — Encounter: Admit: 2017-08-27 | Discharge: 2017-08-27 | Attending: Family | Primary: MD

## 2017-08-27 DIAGNOSIS — Z7689 Persons encountering health services in other specified circumstances: Secondary | ICD-10-CM

## 2017-08-27 NOTE — Progress Notes (Signed)
Dandrea Medders is a 62 y.o. female, DOB 1956-08-14, who is being seen today for a fit for duty exam. The purpose for today's exam is evaluate work status.    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  No past surgical history on file.    REVIEW OF SYSTEMS:    Review of Systems       PHYSICAL EXAM:    There were no vitals filed for this visit. There is no height or weight on file to calculate BMI.  Physical Exam      ASSESSMENT & PLAN:      ICD-10-CM    1. Return to work evaluation Z76.89      RTW done for 08/28/17    This note was transcribed using speech recognition software. Please contact us for clarification if any questions arise relating to the wording of this document.

## 2017-10-14 LAB — BASIC METABOLIC PANEL
BUN: 21 (ref 4–21)
Creatinine: 0.7 (ref <=1.1)
Glucose: 105
Sodium: 140 (ref 137–147)

## 2017-10-14 LAB — LIPID PANEL
Cholesterol: 139 (ref 0–200)
HDL: 47 (ref 35–70)
LDL Cholesterol: 72
Triglycerides: 115 (ref 40–160)

## 2017-11-03 ENCOUNTER — Other Ambulatory Visit: Admit: 2017-11-03 | Discharge: 2017-11-03 | Payer: PRIVATE HEALTH INSURANCE | Primary: MD

## 2017-11-03 DIAGNOSIS — N959 Unspecified menopausal and perimenopausal disorder: Secondary | ICD-10-CM

## 2017-11-03 LAB — TESTOSTERONE, TOTAL AND FREE, SERUM (MAYO)
Testosterone, Free: 0.1 ng/dL (ref 0.06–0.87)
Testosterone, Total: 19 ng/dL (ref 8–60)

## 2017-11-03 LAB — PROGESTERONE: Progesterone: 2.8 ng/mL

## 2017-11-03 LAB — ESTRADIOL: Estradiol: 84 pg/mL

## 2017-11-03 LAB — DIHYDROTESTOSTERONE: Dihydrotestosterone: 79 pg/mL (ref <=128)

## 2017-11-25 LAB — FECAL OCCULT BLOOD, GUAIAC: Fecal Occult Blood: NEGATIVE

## 2017-12-09 ENCOUNTER — Other Ambulatory Visit (HOSPITAL_COMMUNITY): Payer: Self-pay | Admitting: Pulmonary Disease

## 2017-12-09 ENCOUNTER — Ambulatory Visit (HOSPITAL_COMMUNITY)
Admission: RE | Admit: 2017-12-09 | Discharge: 2017-12-09 | Disposition: A | Discharge status: Home / Self Care | Payer: BC Managed Care – PPO | Source: Ambulatory Visit | Attending: Pulmonary Disease | Admitting: Pulmonary Disease

## 2017-12-09 DIAGNOSIS — M1711 Unilateral primary osteoarthritis, right knee: Secondary | ICD-10-CM | POA: Insufficient documentation

## 2017-12-09 DIAGNOSIS — R52 Pain, unspecified: Secondary | ICD-10-CM

## 2017-12-09 DIAGNOSIS — M25561 Pain in right knee: Secondary | ICD-10-CM | POA: Insufficient documentation

## 2018-01-16 ENCOUNTER — Ambulatory Visit: Payer: BC Managed Care – PPO | Admitting: Orthopedic Surgery

## 2018-01-16 ENCOUNTER — Encounter: Payer: Self-pay | Admitting: Orthopedic Surgery

## 2018-01-16 VITALS — BP 146/82 | HR 60 | Ht 59.0 in | Wt 230.0 lb

## 2018-01-16 DIAGNOSIS — M7052 Other bursitis of knee, left knee: Secondary | ICD-10-CM | POA: Diagnosis not present

## 2018-01-16 DIAGNOSIS — M1711 Unilateral primary osteoarthritis, right knee: Secondary | ICD-10-CM | POA: Diagnosis not present

## 2018-01-16 DIAGNOSIS — M23321 Other meniscus derangements, posterior horn of medial meniscus, right knee: Secondary | ICD-10-CM

## 2018-01-16 DIAGNOSIS — M7051 Other bursitis of knee, right knee: Secondary | ICD-10-CM | POA: Diagnosis not present

## 2018-01-16 DIAGNOSIS — M25561 Pain in right knee: Secondary | ICD-10-CM

## 2018-01-16 MED ORDER — TRAMADOL-ACETAMINOPHEN 37.5-325 MG PO TABS: 1.0000 | ORAL_TABLET | ORAL | 5 refills | Status: DC | PRN

## 2018-01-16 NOTE — Patient Instructions (Signed)
These are the muscle and arthrits creams I recommend:  PLEASE READ THE PACKAGE INSTRUCTIONS BEFORE USING   Ben Gay arthritis cream  Icy hot vanishing gel  Aspercreme odor free  Myoflex Oderless pain reliever  Capzasin  Sportscreme  Max freeze  

## 2018-01-16 NOTE — Progress Notes (Signed)
NEW PATIENT OFFICE VISIT   Chief Complaint  Patient presents with  . Knee Pain    right knee pain      MEDICAL DECISION SECTION  xrays ordered? NO I was able to look at the x-rays that the patient had which were done on December 09, 2017  That x-ray shows mild arthritis of the medial compartment Encounter Diagnoses  Name Primary?  . Primary osteoarthritis of right knee Yes  . Derangement of posterior horn of medial meniscus of right knee   . Pes anserinus bursitis of both knees   . Acute pain of right knee      PLAN:  The patient has a possible allergy to NSAIDs had swelling of her eyes on 2 occasions with high-dose ibuprofen  Recommend Ultracet for pain  MRI to evaluate her medial meniscus  Topical medications to alleviate the bursitis and she will follow-up after the MRI has been completed Meds ordered this encounter  Medications  . traMADol-acetaminophen (ULTRACET) 37.5-325 MG tablet    Sig: Take 1 tablet by mouth every 4 (four) hours as needed.    Dispense:  30 tablet    Refill:  5   Injection? NO MRI/CT/? YES   Chief Complaint  Patient presents with  . Knee Pain    right knee pain     Consult has been requested by Dr. Sinda Du for evaluation of right knee pain    62 year old female with morbid obesity and NSAID allergy.  Presents with several month (2 months), history of right medial knee pain.  She had a episode where she stepped off height her knee buckled she felt acute pain initially got better with some Tylenol then a second insult to the knee where she hyperextended it and the pain came back it was a wart worse it was nonradiating is confined to the medial compartment its dull its associated with inability to extend the knee and medial swelling.     Review of Systems  Constitutional: Negative for chills, fever and weight loss.  Respiratory: Negative for shortness of breath.   Cardiovascular: Negative for chest pain.  Neurological: Negative  for tingling.     History reviewed. No pertinent past medical history.  Past Surgical History:  Procedure Laterality Date  . ABDOMINAL HYSTERECTOMY    . COLONOSCOPY N/A 05/18/2014   Procedure: COLONOSCOPY;  Surgeon: Rogene Houston, MD;  Location: [facility];  Service: Endoscopy;  Laterality: N/A;  1200  . DILATION AND CURETTAGE OF UTERUS     x 2  . lasik    . TUBAL LIGATION      Family History  Problem Relation Age of Onset  . Other Mother   . Dementia Father   . Heart murmur Father   . High blood pressure Father   . Arthritis Father   . High blood pressure Sister   . Arthritis Sister    Social History   Tobacco Use  . Smoking status: Never Smoker  . Smokeless tobacco: Never Used  Substance Use Topics  . Alcohol use: No  . Drug use: No    Allergies  Allergen Reactions  . Ibuprofen Swelling    Eyes swell   . Penicillins Hives     Current Meds  Medication Sig  . albuterol (PROVENTIL HFA;VENTOLIN HFA) 108 (90 BASE) MCG/ACT inhaler Inhale 1-2 puffs into the lungs every 6 (six) hours as needed for wheezing or shortness of breath.  . fluticasone (FLONASE) 50 MCG/ACT nasal spray Place 2  sprays into both nostrils daily.  . folic acid (FOLVITE) 1 MG tablet Take 1 mg by mouth daily.  . lansoprazole (PREVACID) 30 MG capsule Take 30 mg by mouth daily at 12 noon.  . Multiple Vitamins-Minerals (MULTIVITAMINS THER. W/MINERALS) TABS tablet Take 1 tablet by mouth daily.  . nadolol-bendroflumethiazide (CORZIDE) 40-5 MG per tablet Take 1 tablet by mouth daily.  . Omega-3 Fatty Acids (FISH OIL PO) Take 1 capsule by mouth daily.  Marland Kitchen oxymetazoline (AFRIN) 0.05 % nasal spray Place 1 spray into both nostrils 2 (two) times daily as needed for congestion.  . phenylephrine (MYDFRIN) 2.5 % ophthalmic solution 1 drop once.  . simvastatin (ZOCOR) 40 MG tablet Take 40 mg by mouth daily.  . vitamin B-12 (CYANOCOBALAMIN) 1000 MCG tablet Take 1,000 mcg by mouth daily.    BP (!) 146/82    Pulse 60   Ht 4\' 11"  (1.499 m)   Wt 230 lb (104.3 kg)   BMI 46.45 kg/m   Physical Exam  Constitutional: She is oriented to person, place, and time. She appears well-developed and well-nourished.  Moderate obesity BMI 46  Musculoskeletal:       Right knee: She exhibits effusion.  Neurological: She is alert and oriented to person, place, and time.  Psychiatric: She has a normal mood and affect. Judgment normal.  Vitals reviewed.   Right Knee Exam   Muscle Strength  The patient has normal right knee strength.  Tenderness  The patient is experiencing tenderness in the medial joint line.  Range of Motion  Extension:  10 normal  Flexion:  120 normal   Tests  McMurray:  Medial - positive Lateral - negative Varus: negative Valgus: negative Drawer:  Anterior - negative    Posterior - negative  Other  Erythema: absent Scars: absent Sensation: normal Pulse: present Swelling: none Effusion: effusion present   Left Knee Exam   Muscle Strength  The patient has normal left knee strength.  Tenderness  The patient is experiencing no tenderness.   Range of Motion  Extension: normal  Flexion: normal   Tests  McMurray:  Medial - negative Lateral - negative Varus: negative Valgus: negative Drawer:  Anterior - negative     Posterior - negative  Other  Erythema: absent Scars: absent Sensation: normal Pulse: present Swelling: none        Arther Abbott, MD  01/16/2018 9:15 AM

## 2018-01-27 ENCOUNTER — Telehealth: Payer: Self-pay | Admitting: Radiology

## 2018-01-27 DIAGNOSIS — M1711 Unilateral primary osteoarthritis, right knee: Secondary | ICD-10-CM

## 2018-01-27 NOTE — Telephone Encounter (Signed)
Advised patient MRI denied by insurance needs conservative treatment physical therapy patient agrees to go, ordered.

## 2018-02-04 ENCOUNTER — Ambulatory Visit (HOSPITAL_COMMUNITY): Payer: BC Managed Care – PPO | Attending: Orthopedic Surgery

## 2018-02-04 ENCOUNTER — Encounter (HOSPITAL_COMMUNITY): Payer: Self-pay

## 2018-02-04 DIAGNOSIS — R2681 Unsteadiness on feet: Secondary | ICD-10-CM | POA: Diagnosis present

## 2018-02-04 DIAGNOSIS — M25561 Pain in right knee: Secondary | ICD-10-CM | POA: Diagnosis present

## 2018-02-04 DIAGNOSIS — R600 Localized edema: Secondary | ICD-10-CM

## 2018-02-04 DIAGNOSIS — G8929 Other chronic pain: Secondary | ICD-10-CM | POA: Diagnosis present

## 2018-02-04 DIAGNOSIS — R2689 Other abnormalities of gait and mobility: Secondary | ICD-10-CM | POA: Insufficient documentation

## 2018-02-04 NOTE — Therapy (Signed)
[facility] [address] [place], [place], [address] Phone: [phone]   Fax:  [phone]  Physical Therapy Evaluation  Patient Details  Name: Samantha Larsen MRN: [id number] Date of Birth: [birth year] Referring Provider: Adonis Huguenin   Encounter Date: 02/04/2018  PT End of Session - 02/04/18 1305    Visit Number  1    Number of Visits  18    Date for PT Re-Evaluation  03/18/18 mini-reassess 02/25/2018    Authorization Type  [employer]    Authorization Time Period  80/20 coins. $1250 deduct; $4890 OOP - $495.30 met; PT/OT/SLP no limit; medical necessity    Authorization - Visit Number  1    Authorization - Number of Visits  10    PT Start Time  1120    PT Stop Time  1205    PT Time Calculation (min)  45 min    Activity Tolerance  Patient tolerated treatment well    Behavior During Therapy  WFL for tasks assessed/performed       History reviewed. No pertinent past medical history.  Past Surgical History:  Procedure Laterality Date  . ABDOMINAL HYSTERECTOMY    . COLONOSCOPY N/A 05/18/2014   Procedure: COLONOSCOPY;  Surgeon: Rogene Houston, MD;  [facility];  Service: Endoscopy;  Laterality: N/A;  1200  . DILATION AND CURETTAGE OF UTERUS     x 2  . lasik    . TUBAL LIGATION      There were no vitals filed for this visit.   Subjective Assessment - 02/04/18 1136    Subjective  Patient got out of truck awkwardly with right knee pain that got better over time but then mother-in-law began to fall so patient caughter her with resultant right knee pain. Not getting better; swelling. Has to take breaks for housework. More pain with more activity through the day. Is taking cre of 31 yr old mother-in-law in their house now.     Pertinent History  NSAID allergy    Limitations  Lifting;Standing;Walking;House hold activities    How long can you stand comfortably?  15-20 minutes    How long can you walk  comfortably?  1 block    Diagnostic tests  XR - mild OA medial compartment, posterior horm meniscus derangement, pes anserine bursitis bilateral, acute rt knee pain; MRI denied until post PT    Patient Stated Goals  walk around on a cruise; less pain for daily activities    Currently in Pain?  Yes    Pain Score  2     Pain Location  Knee    Pain Orientation  Right;Medial;Anterior sometimes posterior knee and inferior to patella    Pain Descriptors / Indicators  Tender;Throbbing    Pain Type  Chronic pain    Pain Onset  More than a month ago    Pain Frequency  Constant    Aggravating Factors   standing, walking, lifting, household activities    Pain Relieving Factors  pain medication, rest breaks sitting down    Effect of Pain on Daily Activities  pacing of activities, working through the pain despite to do normal daily activities         [facility] PT Assessment - 02/04/18 0001      Assessment   Medical Diagnosis  Rt knee pain    Referring Provider  Adonis Huguenin    Onset Date/Surgical Date  10/10/17    Hand Dominance  Right  Next MD Visit  after MRI or PT    Prior Therapy  No      Precautions   Precautions  None      Restrictions   Weight Bearing Restrictions  No      Balance Screen   Has the patient fallen in the past 6 months  No    Has the patient had a decrease in activity level because of a fear of falling?   No    Is the patient reluctant to leave their home because of a fear of falling?   No      Home Film/video editor residence    Living Arrangements  Spouse/significant other      Prior Function   Level of Independence  Independent    Leisure  go on cruises, care for mother-in-law and grandchildren      Cognition   Overall Cognitive Status  Within Functional Limits for tasks assessed      Observation/Other Assessments   Focus on Therapeutic Outcomes (FOTO)   62% limited      Circumferential Edema   Circumferential - Right  61.8 cm     Circumferential - Left   60.5 cm      AROM   Right Knee Extension  0    Right Knee Flexion  117 soft tissue approximation and pain limited      Strength   Right Hip Flexion  4/5    Right Hip Extension  4-/5    Right Hip ABduction  4/5    Left Hip Flexion  4-/5    Left Hip Extension  4/5    Left Hip ABduction  4/5    Right Knee Flexion  4-/5    Right Knee Extension  4/5    Left Knee Flexion  4/5    Left Knee Extension  4+/5    Right Ankle Dorsiflexion  4+/5    Left Ankle Dorsiflexion  5/5      Transfers   Five time sit to stand comments   16.74 sec      Ambulation/Gait   Ambulation Distance (Feet)  430 Feet 2MWT    Gait Pattern  Step-through pattern;Decreased stance time - right;Decreased weight shift to right    Gait velocity  1.09 m/s      Balance   Balance Assessed  Yes      Static Standing Balance   Static Standing - Balance Support  No upper extremity supported    Static Standing - Level of Assistance  7: Independent    Static Standing Balance -  Activities   Single Leg Stance - Left Leg;Single Leg Stance - Right Leg    Static Standing - Comment/# of Minutes  Lt 10 sec; Rt 11 sec                Objective measurements completed on examination: See above findings.             PT Education - 02/04/18 1303    Education Details  PT process, treatment, frequency and duration clinical decision making, strength and balance and how it figures into joint wear and pain    Person(s) Educated  Patient    Methods  Explanation    Comprehension  Verbalized understanding       PT Short Term Goals - 02/04/18 1324      PT SHORT TERM GOAL #1   Title  Patient will regularly perform and demonstrate competency in  performance of an initial HEP.     Time  3    Status  New    Target Date  02/25/18      PT SHORT TERM GOAL #2   Title  Patient will be able to don and doff shoes without complaints of pain.    Baseline  a little limited    Time  3    Period   Weeks    Status  New      PT SHORT TERM GOAL #3   Title  Patient will report no greater than 4/10 pain with activitiy throughout the day.    Baseline  7/10    Time  3    Period  Weeks    Status  New      PT SHORT TERM GOAL #4   Title  Patient will exhibit a 5 point improvement in FOTO score to lesser than or equal to 57% limitation.    Baseline  62%    Time  3    Period  Weeks    Status  New        PT Long Term Goals - 02/04/18 1328      PT LONG TERM GOAL #1   Title  Patient will regularly perform and demonstrate competency in performance of an advanced HEP.     Time  6    Period  Weeks    Status  New    Target Date  03/18/18      PT LONG TERM GOAL #2   Title  Patient will report no greater than 2/10 pain with activitiy throughout the day.    Baseline  7/10    Time  6    Period  Weeks    Status  New      PT LONG TERM GOAL #3   Title  Patient will exhibit a 10 point improvement in FOTO score to lesser than or equal to 52% limitation.    Baseline  62% limited    Time  6    Period  Weeks    Status  New      PT LONG TERM GOAL #4   Title  Patient will be able to transfer from floor to stand to play with grandchildren with minimal c/o rt knee pain.    Time  6    Period  Weeks    Status  New      PT LONG TERM GOAL #5   Title  Patient will be able to walk throughout large [employer] store without c/o increased pain.     Baseline  increased pain with activity    Time  6    Period  Weeks    Status  New             Plan - 02/04/18 1308    Clinical Impression Statement  Patient is a 62 year old female with complaints of right knee pain after 2 injuries within 2 weeks. XR indicates mild OA of medial compartment, pes anserine bursitis, posterior horn of medial meniscus derangement and joint effusion. Patient exhibited painful/decreased rt knee flexion with soft tissue approximation at end range of 117 degrees.  Joint line swelling, largely medial, of 1.3 cm greater on  right then left. She exhibited decreased balance and areas of decreased strength in bilateral LEs as well as decreased functional status with increased pain with increased activity levels. She did not show an increased risk of falls as she had a good  5 sit-to-stand transfer time and 2 Minute Walk Test time. Patient would benefit from skilled OPPT to improve the aforementioned deficits.     History and Personal Factors relevant to plan of care:  BMI 46, also has left knee pain without injury    Clinical Presentation  Stable    Clinical Presentation due to:  MMT, ROM, swelling, pain, FOTO    Clinical Decision Making  Low    Rehab Potential  Fair    Clinical Impairments Affecting Rehab Potential  BMI 46, also has left knee pain without injury    PT Frequency  3x / week    PT Duration  6 weeks    PT Treatment/Interventions  Aquatic Therapy;Biofeedback;Cryotherapy;Electrical Stimulation;Ultrasound;Moist Heat;Iontophoresis 74m/ml Dexamethasone;Gait training;Functional mobility training;Therapeutic activities;Therapeutic exercise;Balance training;Patient/family education;Stair training;Neuromuscular re-education;Manual techniques;Passive range of motion;Dry needling;Energy conservation;Taping    PT Next Visit Plan  review goals, initiate HEP - QS, SAQ, bridges, LAQ, hip abd, in general LE strengthening and balance training.    PT Home Exercise Plan  initial - none    Consulted and Agree with Plan of Care  Patient       Patient will benefit from skilled therapeutic intervention in order to improve the following deficits and impairments:  Abnormal gait, Pain, Decreased activity tolerance, Decreased strength, Decreased range of motion, Decreased balance, Difficulty walking, Increased edema, Obesity  Visit Diagnosis: Unsteadiness on feet - Plan: PT plan of care cert/re-cert  Other abnormalities of gait and mobility - Plan: PT plan of care cert/re-cert  Chronic pain of right knee - Plan: PT plan of care  cert/re-cert  Localized edema - Plan: PT plan of care cert/re-cert     Problem List There are no active problems to display for this patient.   BFloria Raveling Hartnett-Rands, MS, PT Per D[place]##025426/07/2018, 1:37 PM  [place]SSection [place] [address]Phone: [phone]  Fax:  [phone] Name: Samantha CARDAMRN: [id number] of Birth: [birth year]

## 2018-02-05 ENCOUNTER — Ambulatory Visit (HOSPITAL_COMMUNITY): Payer: BC Managed Care – PPO

## 2018-02-05 ENCOUNTER — Encounter (HOSPITAL_COMMUNITY): Payer: Self-pay

## 2018-02-05 DIAGNOSIS — M25561 Pain in right knee: Secondary | ICD-10-CM

## 2018-02-05 DIAGNOSIS — R600 Localized edema: Secondary | ICD-10-CM

## 2018-02-05 DIAGNOSIS — G8929 Other chronic pain: Secondary | ICD-10-CM

## 2018-02-05 DIAGNOSIS — R2681 Unsteadiness on feet: Secondary | ICD-10-CM | POA: Diagnosis not present

## 2018-02-05 DIAGNOSIS — R2689 Other abnormalities of gait and mobility: Secondary | ICD-10-CM

## 2018-02-05 NOTE — Patient Instructions (Signed)
Long [employer]    Straighten operated leg and try to hold it 5 seconds.  Repeat 10 times. Do 1-2 sessions a day.  http://gt2.exer.us/311   Copyright  VHI. All rights reserved.   Quad Set    Slowly tighten muscles on thigh of straight leg while counting out loud to 5. Repeat with other leg. Repeat 10 times. Do 1-2 sessions per day.  http://gt2.exer.us/362   Copyright  VHI. All rights reserved.   Knee Extension: Short Arc (Eccentric) - Supine or Sitting    Lie on back with roll under knee. Extend knee. Slowly lower foot for 3-5 seconds. 10 reps per set, 1-2 sets per day, 4 days per week.  http://ecce.exer.us/143   Copyright  VHI. All rights reserved.   Bridge    Lie back, legs bent. Inhale, pressing hips up. Keeping ribs in, lengthen lower back. Exhale, rolling down along spine from top. Repeat 10 times. Do 1-2 sessions per day.  http://pm.exer.us/55   Copyright  VHI. All rights reserved.   Abduction    Lift leg up toward ceiling. Return.  Repeat 10 times each leg. Do 1-2 sessions per day.  http://gt2.exer.us/386   Copyright  VHI. All rights reserved.

## 2018-02-05 NOTE — Therapy (Signed)
[facility] [place], [place], [address] Phone: [phone]   Fax:  [phone]  Physical Therapy Treatment  Patient Details  Name: Samantha Larsen MRN: [id number] Date of Birth: [birth year] Referring Provider: Adonis Huguenin   Encounter Date: 02/05/2018  PT End of Session - 02/05/18 0958    Visit Number  2    Number of Visits  18    Date for PT Re-Evaluation  03/18/18 mini-reassess 02/25/18    Authorization Type  [place]    Authorization Time Period  80/20 coins. $1250 deduct; $4890 OOP - $495.30 met; PT/OT/SLP no limit; medical necessity    Authorization - Visit Number  2    Authorization - Number of Visits  10    PT Start Time  [phone]    PT Stop Time  1030    PT Time Calculation (min)  40 min    Activity Tolerance  Patient tolerated treatment well    Behavior During Therapy  [facility] for tasks assessed/performed       History reviewed. No pertinent past medical history.  Past Surgical History:  Procedure Laterality Date  . ABDOMINAL HYSTERECTOMY    . COLONOSCOPY N/A 05/18/2014   Procedure: COLONOSCOPY;  Surgeon: Rogene Houston, MD;  [facility];  Service: Endoscopy;  Laterality: N/A;  1200  . DILATION AND CURETTAGE OF UTERUS     x 2  . lasik    . TUBAL LIGATION      There were no vitals filed for this visit.  Subjective Assessment - 02/05/18 0952    Subjective  Pt stated she has slight increase in Rt knee today, increased pain with standing and weight bearing.  Current pain scale 4/10 sore achey pain.      Pertinent History  NSAID allergy    Patient Stated Goals  walk around on a cruise; less pain for daily activities    Currently in Pain?  Yes    Pain Score  4     Pain Location  Knee    Pain Orientation  Right    Pain Descriptors / Indicators  Aching;Sore    Pain Type  Chronic pain    Pain Onset  More than a month ago    Pain Frequency  Constant    Aggravating Factors   standing,  walking, lifting, household activities    Pain Relieving Factors  pain medication, rest breaks sitting down    Effect of Pain on Daily Activities  pacing of activities, working through the pain despite to do normal daily activities                       [facility] PT Treatment/Exercise - 02/05/18 0001      Exercises   Exercises  Knee/Hip      Knee/Hip Exercises: Stretches   Active Hamstring Stretch  3 reps;30 seconds    Active Hamstring Stretch Limitations  supine 1st with hands behind knee 2nd with rope      Knee/Hip Exercises: Standing   Heel Raises  10 reps    Heel Raises Limitations  toe raises 10x    SLS  Lt 4", Rt 7" max of 3      Knee/Hip Exercises: Seated   Long Arc Quad  10 reps      Knee/Hip Exercises: Supine   Quad Sets  10 reps    Quad Sets Limitations  HEP    Short  Arc [employer]  10 reps    Short Arc Quad Sets Limitations  HEP    Bridges  10 reps      Knee/Hip Exercises: Sidelying   Hip ABduction  Both;10 reps    Hip ABduction Limitations  HEP      Manual Therapy   Manual Therapy  Edema management    Manual therapy comments  Manual complete separate than rest of tx    Edema Management  Retro massage with LE elevated             PT Education - 02/05/18 1010    Education Details  Reviewed goals and copy of eval given to pt.  Established HEP, pt able to verbalize and demonstrate appropriate form wtih all exercises.  Educated/discussed benefits for compression hose to assist with edema control    Person(s) Educated  Patient    Methods  Demonstration;Handout;Explanation    Comprehension  Verbalized understanding;Returned demonstration       PT Short Term Goals - 02/04/18 1324      PT SHORT TERM GOAL #1   Title  Patient will regularly perform and demonstrate competency in performance of an initial HEP.     Time  3    Status  New    Target Date  02/25/18      PT SHORT TERM GOAL #2   Title  Patient will be able to don and doff shoes  without complaints of pain.    Baseline  a little limited    Time  3    Period  Weeks    Status  New      PT SHORT TERM GOAL #3   Title  Patient will report no greater than 4/10 pain with activitiy throughout the day.    Baseline  7/10    Time  3    Period  Weeks    Status  New      PT SHORT TERM GOAL #4   Title  Patient will exhibit a 5 point improvement in FOTO score to lesser than or equal to 57% limitation.    Baseline  62%    Time  3    Period  Weeks    Status  New        PT Long Term Goals - 02/04/18 1328      PT LONG TERM GOAL #1   Title  Patient will regularly perform and demonstrate competency in performance of an advanced HEP.     Time  6    Period  Weeks    Status  New    Target Date  03/18/18      PT LONG TERM GOAL #2   Title  Patient will report no greater than 2/10 pain with activitiy throughout the day.    Baseline  7/10    Time  6    Period  Weeks    Status  New      PT LONG TERM GOAL #3   Title  Patient will exhibit a 10 point improvement in FOTO score to lesser than or equal to 52% limitation.    Baseline  62% limited    Time  6    Period  Weeks    Status  New      PT LONG TERM GOAL #4   Title  Patient will be able to transfer from floor to stand to play with grandchildren with minimal c/o rt knee pain.    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     Baseline  increased pain with activity    Time  6    Period  Weeks    Status  New            Plan - 02/05/18 1026    Clinical Impression Statement  Reviewed goals and copy of eval given to pt.  Establised HEP, pt able to verbalize understanding and demonstrate appropriate mechanics/form with all exercises with no cueing required.  Noted edema present proximal knee medial > lateral.  Pt educated on benefits of compression hose but unsure if will help.  Recommend  further education on benefits and take measurements/give [employer] compression hose store paperwork next session.  EOS with manual retro massage to assist with edema control.    Rehab Potential  Fair    Clinical Impairments Affecting Rehab Potential  BMI 46, also has left knee pain without injury    PT Frequency  3x / week    PT Duration  6 weeks    PT Treatment/Interventions  Aquatic Therapy;Biofeedback;Cryotherapy;Electrical Stimulation;Ultrasound;Moist Heat;Iontophoresis 49m/ml Dexamethasone;Gait training;Functional mobility training;Therapeutic activities;Therapeutic exercise;Balance training;Patient/family education;Stair training;Neuromuscular re-education;Manual techniques;Passive range of motion;Dry needling;Energy conservation;Taping    PT Next Visit Plan  F/u with compliance wiht HEP.  Measurements and compression hose paperwork to give to pt.  Next session begin sit to stand, tandem stance and progress general LE strengthening and balance training as able.    PT Home Exercise Plan  02/04/18: QS, SAQ, bridges, LAQ, hip abd       Patient will benefit from skilled therapeutic intervention in order to improve the following deficits and impairments:  Abnormal gait, Pain, Decreased activity tolerance, Decreased strength, Decreased range of motion, Decreased balance, Difficulty walking, Increased edema, Obesity  Visit Diagnosis: Unsteadiness on feet  Other abnormalities of gait and mobility  Chronic pain of right knee  Localized edema     Problem List There are no active problems to display for this patient.  [id number] LPTA; CSouth End CAldona Lento6/13/2019, 12:34 PM  [place] LaneSJ[place] [address]Phone: [phone]  Fax:  [phone] Name: Samantha BENCOMOMRN: [id number] of Birth: 1Dec 16, 1957

## 2018-02-06 ENCOUNTER — Ambulatory Visit (HOSPITAL_COMMUNITY): Payer: BC Managed Care – PPO | Admitting: Physical Therapy

## 2018-02-06 DIAGNOSIS — R600 Localized edema: Secondary | ICD-10-CM

## 2018-02-06 DIAGNOSIS — R2681 Unsteadiness on feet: Secondary | ICD-10-CM

## 2018-02-06 DIAGNOSIS — R2689 Other abnormalities of gait and mobility: Secondary | ICD-10-CM

## 2018-02-06 DIAGNOSIS — G8929 Other chronic pain: Secondary | ICD-10-CM

## 2018-02-06 DIAGNOSIS — M25561 Pain in right knee: Secondary | ICD-10-CM

## 2018-02-06 NOTE — Therapy (Signed)
[facility] [address] [place], [place], [address] Phone: [phone]   Fax:  [phone]  Physical Therapy Treatment  Patient Details  Name: Samantha Larsen MRN: [id number] Date of Birth: [birth year] Referring Provider: Adonis Huguenin   Encounter Date: 02/06/2018  PT End of Session - 02/06/18 1148    Visit Number  3    Number of Visits  18    Date for PT Re-Evaluation  03/18/18 mini-reassess 02/25/18    Authorization Type  [place]    Authorization Time Period  80/20 coins. $1250 deduct; $4890 OOP - $495.30 met; PT/OT/SLP no limit; medical necessity    Authorization - Visit Number  3    Authorization - Number of Visits  10    PT Start Time  1038    PT Stop Time  1121    PT Time Calculation (min)  43 min    Activity Tolerance  Patient tolerated treatment well    Behavior During Therapy  WFL for tasks assessed/performed       No past medical history on file.  Past Surgical History:  Procedure Laterality Date  . ABDOMINAL HYSTERECTOMY    . COLONOSCOPY N/A 05/18/2014   Procedure: COLONOSCOPY;  Surgeon: Rogene Houston, MD;  [facility];  Service: Endoscopy;  Laterality: N/A;  1200  . DILATION AND CURETTAGE OF UTERUS     x 2  . lasik    . TUBAL LIGATION      There were no vitals filed for this visit.  Subjective Assessment - 02/06/18 1042    Subjective  Pt states that her knee is feeling better.      Pertinent History  NSAID allergy    Limitations  Lifting;Standing;Walking;House hold activities    How long can you stand comfortably?  15-20 minutes    How long can you walk comfortably?  1 block    Diagnostic tests  XR - mild OA medial compartment, posterior horm meniscus derangement, pes anserine bursitis bilateral, acute rt knee pain; MRI denied until post PT    Patient Stated Goals  walk around on a cruise; less pain for daily activities    Pain Score  2     Pain Location  Knee    Pain Descriptors /  Indicators  Discomfort;Aching    Pain Onset  More than a month ago    Pain Frequency  Constant    Aggravating Factors   weight bearing     Pain Relieving Factors  ice and rest     Effect of Pain on Daily Activities  limiits               [facility] Adult PT Treatment/Exercise - 02/06/18 0001      Exercises   Exercises  Knee/Hip      Knee/Hip Exercises: Stretches   Active Hamstring Stretch  --    Passive Hamstring Stretch  Right;3 reps;30 seconds    Gastroc Stretch Limitations  30"x 2       Knee/Hip Exercises: Standing   Heel Raises  10 reps    Knee Flexion  10 reps    Knee Flexion Limitations  3#    Terminal Knee Extension  Strengthening;Right    Forward Step Up  Right;10 reps;Step Height: 4"    Functional Squat  10 reps    SLS  B x 3       Manual Therapy   Manual Therapy  Edema management  Manual therapy comments  Manual complete separate than rest of tx    Edema Management  decongestive techniques from thigh to ankle              PT Education - 02/05/18 1010    Education Details  Reviewed goals and copy of eval given to pt.  Established HEP, pt able to verbalize and demonstrate appropriate form wtih all exercises.  Educated/discussed benefits for compression hose to assist with edema control    Person(s) Educated  Patient    Methods  Demonstration;Handout;Explanation    Comprehension  Verbalized understanding;Returned demonstration       PT Short Term Goals - 02/06/18 1153      PT SHORT TERM GOAL #1   Title  Patient will regularly perform and demonstrate competency in performance of an initial HEP.     Time  3    Status  On-going      PT SHORT TERM GOAL #2   Title  Patient will be able to don and doff shoes without complaints of pain.    Baseline  a little limited    Time  3    Period  Weeks    Status  On-going      PT SHORT TERM GOAL #3   Title  Patient will report no greater than 4/10 pain with activitiy throughout the day.    Baseline  7/10     Time  3    Period  Weeks    Status  On-going      PT SHORT TERM GOAL #4   Title  Patient will exhibit a 5 point improvement in FOTO score to lesser than or equal to 57% limitation.    Baseline  62%    Time  3    Period  Weeks    Status  On-going        PT Long Term Goals - 02/06/18 1153      PT LONG TERM GOAL #1   Title  Patient will regularly perform and demonstrate competency in performance of an advanced HEP.     Time  6    Period  Weeks    Status  On-going      PT LONG TERM GOAL #2   Title  Patient will report no greater than 2/10 pain with activitiy throughout the day.    Baseline  7/10    Time  6    Period  Weeks    Status  On-going      PT LONG TERM GOAL #3   Title  Patient will exhibit a 10 point improvement in FOTO score to lesser than or equal to 52% limitation.    Baseline  62% limited    Time  6    Period  Weeks    Status  On-going      PT LONG TERM GOAL #4   Title  Patient will be able to transfer from floor to stand to play with grandchildren with minimal c/o rt knee pain.    Time  6    Period  Weeks    Status  On-going      PT LONG TERM GOAL #5   Title  Patient will be able to walk throughout large [employer] store without c/o increased pain.     Baseline  increased pain with activity    Time  6    Period  Weeks    Status  On-going  Plan - 02/06/18 1150    Clinical Impression Statement  Added standing terminal extension, knee flexion and 4" step up with minimal cuing needed for proper technique.  Pt continues to have increased edema.  LE measured and pt given sheet with area providers of compression garments. Manual notes increased edema in superior and medial aspect of knee.     Rehab Potential  Fair    Clinical Impairments Affecting Rehab Potential  BMI 46, also has left knee pain without injury    PT Frequency  3x / week    PT Duration  6 weeks    PT Treatment/Interventions  Aquatic Therapy;Biofeedback;Cryotherapy;Electrical  Stimulation;Ultrasound;Moist Heat;Iontophoresis 10m/ml Dexamethasone;Gait training;Functional mobility training;Therapeutic activities;Therapeutic exercise;Balance training;Patient/family education;Stair training;Neuromuscular re-education;Manual techniques;Passive range of motion;Dry needling;Energy conservation;Taping    PT Next Visit Plan   Next session begin sit to stand, tandem stance and progress general LE strengthening and balance training as able.    PT Home Exercise Plan  02/04/18: QS, SAQ, bridges, LAQ, hip abd       Patient will benefit from skilled therapeutic intervention in order to improve the following deficits and impairments:  Abnormal gait, Pain, Decreased activity tolerance, Decreased strength, Decreased range of motion, Decreased balance, Difficulty walking, Increased edema, Obesity  Visit Diagnosis: Unsteadiness on feet  Other abnormalities of gait and mobility  Chronic pain of right knee  Localized edema     Problem List There are no active problems to display for this patient.  CRayetta Humphrey PT CLT [phone]/14/2019, 11:54 AM  [place] [address]Phone: [phone]  Fax:  [phone] Name: SDEBORRA PHEGLEYMRN: [id number] of Birth: 1Sep 17, 1957

## 2018-02-10 ENCOUNTER — Ambulatory Visit (HOSPITAL_COMMUNITY): Payer: BC Managed Care – PPO | Admitting: Physical Therapy

## 2018-02-10 DIAGNOSIS — R2681 Unsteadiness on feet: Secondary | ICD-10-CM

## 2018-02-10 DIAGNOSIS — G8929 Other chronic pain: Secondary | ICD-10-CM

## 2018-02-10 DIAGNOSIS — R2689 Other abnormalities of gait and mobility: Secondary | ICD-10-CM

## 2018-02-10 DIAGNOSIS — M25561 Pain in right knee: Secondary | ICD-10-CM

## 2018-02-10 NOTE — Therapy (Signed)
[facility], [place], [address] Phone: [phone]   Fax:  [phone]  Physical Therapy Treatment  Patient Details  Name: Samantha Larsen MRN: [id number] Date of Birth: 19-Mar-1956 Referring Provider: Adonis Huguenin   Encounter Date: 02/10/2018  PT End of Session - 02/10/18 1123    Visit Number  4    Number of Visits  18    Date for PT Re-Evaluation  03/18/18 mini-reassess 02/25/18    Authorization Type  [place]    Authorization Time Period  80/20 coins. $1250 deduct; $4890 OOP - $495.30 met; PT/OT/SLP no limit; medical necessity    Authorization - Visit Number  4    Authorization - Number of Visits  10    PT Start Time  1030    PT Stop Time  1115    PT Time Calculation (min)  45 min    Activity Tolerance  Patient tolerated treatment well    Behavior During Therapy  WFL for tasks assessed/performed       No past medical history on file.  Past Surgical History:  Procedure Laterality Date  . ABDOMINAL HYSTERECTOMY    . COLONOSCOPY N/A 05/18/2014   Procedure: COLONOSCOPY;  Surgeon: Rogene Houston, MD;  [facility];  Service: Endoscopy;  Laterality: N/A;  1200  . DILATION AND CURETTAGE OF UTERUS     x 2  . lasik    . TUBAL LIGATION      There were no vitals filed for this visit.  Subjective Assessment - 02/10/18 1036    Subjective  PT states she currently does not have any pain but comes and goes according to her activity level.  YEsterday walked to her sons house and increased to 6/10.  Pt states it is aggrevating.    Currently in Pain?  No/denies                       [facility] Adult PT Treatment/Exercise - 02/10/18 0001      Knee/Hip Exercises: Stretches   Active Hamstring Stretch  3 reps;30 seconds;Right    Active Hamstring Stretch Limitations  standing 12" step    Gastroc Stretch  Both;3 reps;30 seconds    Gastroc Stretch Limitations  slant board      Knee/Hip Exercises: Standing   Heel Raises  15 reps    Knee Flexion  15 reps    Knee Flexion Limitations  3#    Lateral Step Up  Right;10 reps;Step Height: 4";Hand Hold: 1    Forward Step Up  Right;10 reps;Hand Hold: 1;Step Height: 4"    Step Down  Right;10 reps;Step Height: 4";Hand Hold: 1    Functional Squat  10 reps    SLS  B x 2 max of 18" each     SLS with Vectors  bilaterally 5X5" each with 1 UE assist      Manual Therapy   Manual Therapy  Edema management    Manual therapy comments  Manual complete separate than rest of tx    Edema Management  decongestive techniques from thigh to ankle                PT Short Term Goals - 02/06/18 1153      PT SHORT TERM GOAL #1   Title  Patient will regularly perform and demonstrate competency in performance of an initial HEP.     Time  3  Status  On-going      PT SHORT TERM GOAL #2   Title  Patient will be able to don and doff shoes without complaints of pain.    Baseline  a little limited    Time  3    Period  Weeks    Status  On-going      PT SHORT TERM GOAL #3   Title  Patient will report no greater than 4/10 pain with activitiy throughout the day.    Baseline  7/10    Time  3    Period  Weeks    Status  On-going      PT SHORT TERM GOAL #4   Title  Patient will exhibit a 5 point improvement in FOTO score to lesser than or equal to 57% limitation.    Baseline  62%    Time  3    Period  Weeks    Status  On-going        PT Long Term Goals - 02/06/18 1153      PT LONG TERM GOAL #1   Title  Patient will regularly perform and demonstrate competency in performance of an advanced HEP.     Time  6    Period  Weeks    Status  On-going      PT LONG TERM GOAL #2   Title  Patient will report no greater than 2/10 pain with activitiy throughout the day.    Baseline  7/10    Time  6    Period  Weeks    Status  On-going      PT LONG TERM GOAL #3   Title  Patient will exhibit a 10 point improvement in FOTO score  to lesser than or equal to 52% limitation.    Baseline  62% limited    Time  6    Period  Weeks    Status  On-going      PT LONG TERM GOAL #4   Title  Patient will be able to transfer from floor to stand to play with grandchildren with minimal c/o rt knee pain.    Time  6    Period  Weeks    Status  On-going      PT LONG TERM GOAL #5   Title  Patient will be able to walk throughout large [employer] store without c/o increased pain.     Baseline  increased pain with activity    Time  6    Period  Weeks    Status  On-going            Plan - 02/10/18 1123    Clinical Impression Statement  continued with established POC focusing on increasing Rt knee strength/stabiltiy and reducing edema and pain.  Added lateral step ups and forward step downs today as well as vector stance to increase SLS time/stability.  Pt has not acquired compression garment.  Given information regarding lymphedema for family member and educated on sign/symptoms of this.  Contineud with manual to reduce edema with largest concentration medially.      Rehab Potential  Fair    Clinical Impairments Affecting Rehab Potential  BMI 46, also has left knee pain without injury    PT Frequency  3x / week    PT Duration  6 weeks    PT Treatment/Interventions  Aquatic Therapy;Biofeedback;Cryotherapy;Electrical Stimulation;Ultrasound;Moist Heat;Iontophoresis 3m/ml Dexamethasone;Gait training;Functional mobility training;Therapeutic activities;Therapeutic exercise;Balance training;Patient/family education;Stair training;Neuromuscular re-education;Manual techniques;Passive range of motion;Dry needling;Energy conservation;Taping  PT Next Visit Plan   Next session begin sit to stand, tandem stance and progress general LE strengthening and balance training as able.    PT Home Exercise Plan  02/04/18: QS, SAQ, bridges, LAQ, hip abd       Patient will benefit from skilled therapeutic intervention in order to improve the following  deficits and impairments:  Abnormal gait, Pain, Decreased activity tolerance, Decreased strength, Decreased range of motion, Decreased balance, Difficulty walking, Increased edema, Obesity  Visit Diagnosis: Unsteadiness on feet  Other abnormalities of gait and mobility  Chronic pain of right knee     Problem List There are no active problems to display for this patient.  Teena Irani, PTA/CLT [phone]  Teena Irani 02/10/2018, 11:27 AM  [facility], [place], [address] Phone: [phone]   Fax:  [phone]  Name: Samantha Larsen MRN: [id number] Date of Birth: [birth year]

## 2018-02-11 ENCOUNTER — Encounter (HOSPITAL_COMMUNITY): Payer: Self-pay

## 2018-02-11 ENCOUNTER — Ambulatory Visit (HOSPITAL_COMMUNITY): Payer: BC Managed Care – PPO

## 2018-02-11 DIAGNOSIS — R600 Localized edema: Secondary | ICD-10-CM

## 2018-02-11 DIAGNOSIS — R2681 Unsteadiness on feet: Secondary | ICD-10-CM | POA: Diagnosis not present

## 2018-02-11 DIAGNOSIS — M25561 Pain in right knee: Secondary | ICD-10-CM

## 2018-02-11 DIAGNOSIS — G8929 Other chronic pain: Secondary | ICD-10-CM

## 2018-02-11 DIAGNOSIS — R2689 Other abnormalities of gait and mobility: Secondary | ICD-10-CM

## 2018-02-11 NOTE — Therapy (Signed)
[facility] [address] [place], [place], [address] Phone: [phone]   Fax:  [phone]  Physical Therapy Treatment  Patient Details  Name: Samantha Larsen MRN: [id number] Date of Birth: 1955/11/04 Referring Provider: Adonis Huguenin   Encounter Date: 02/11/2018  PT End of Session - 02/11/18 0907    Visit Number  5    Number of Visits  18    Date for PT Re-Evaluation  03/18/18    Authorization Type  [place]    Authorization Time Period  80/20 coins. $1250 deduct; $4890 OOP - $495.30 met; PT/OT/SLP no limit; medical necessity    Authorization - Visit Number  5    Authorization - Number of Visits  10    PT Start Time  0904    PT Stop Time  0943    PT Time Calculation (min)  39 min    Activity Tolerance  Patient tolerated treatment well    Behavior During Therapy  [facility] for tasks assessed/performed       History reviewed. No pertinent past medical history.  Past Surgical History:  Procedure Laterality Date  . ABDOMINAL HYSTERECTOMY    . COLONOSCOPY N/A 05/18/2014   Procedure: COLONOSCOPY;  Surgeon: Rogene Houston, MD;  [facility];  Service: Endoscopy;  Laterality: N/A;  1200  . DILATION AND CURETTAGE OF UTERUS     x 2  . lasik    . TUBAL LIGATION      There were no vitals filed for this visit.  Subjective Assessment - 02/11/18 0905    Subjective  Pt stated she is feeling good today, reports some pain yesterday afternoon watching grandkids and grocery shopping.    Patient Stated Goals  walk around on a cruise; less pain for daily activities    Currently in Pain?  No/denies                       [facility] Adult PT Treatment/Exercise - 02/11/18 0001      Knee/Hip Exercises: Stretches   Active Hamstring Stretch  3 reps;30 seconds;Right    Active Hamstring Stretch Limitations  standing 12" step    Gastroc Stretch  Both;3 reps;30 seconds    Gastroc Stretch Limitations  slant board       Knee/Hip Exercises: Standing   Heel Raises  20 reps    Heel Raises Limitations  toe raises 20x    Lateral Step Up  Left;Hand Hold: 1;Step Height: 4";15 reps    Forward Step Up  Right;10 reps;Hand Hold: 1;Step Height: 6"    Step Down  Right;10 reps;Step Height: 4";Hand Hold: 1    Functional Squat  10 reps    SLS  5x each Rt 15", Lt 9" max    SLS with Vectors  bilaterally 5X5" each with 1 UE assist    Other Standing Knee Exercises  tandem stance on foam 2x 30"      Knee/Hip Exercises: Seated   Sit to Sand  10 reps;without UE support eccentric control      Manual Therapy   Manual Therapy  Edema management    Manual therapy comments  Manual complete separate than rest of tx    Edema Management  retrograde massage with LE elevated               PT Short Term Goals - 02/06/18 1153      PT SHORT TERM GOAL #1   Title  Patient will regularly perform and demonstrate competency in performance of an initial HEP.     Time  3    Status  On-going      PT SHORT TERM GOAL #2   Title  Patient will be able to don and doff shoes without complaints of pain.    Baseline  a little limited    Time  3    Period  Weeks    Status  On-going      PT SHORT TERM GOAL #3   Title  Patient will report no greater than 4/10 pain with activitiy throughout the day.    Baseline  7/10    Time  3    Period  Weeks    Status  On-going      PT SHORT TERM GOAL #4   Title  Patient will exhibit a 5 point improvement in FOTO score to lesser than or equal to 57% limitation.    Baseline  62%    Time  3    Period  Weeks    Status  On-going        PT Long Term Goals - 02/06/18 1153      PT LONG TERM GOAL #1   Title  Patient will regularly perform and demonstrate competency in performance of an advanced HEP.     Time  6    Period  Weeks    Status  On-going      PT LONG TERM GOAL #2   Title  Patient will report no greater than 2/10 pain with activitiy throughout the day.    Baseline  7/10    Time   6    Period  Weeks    Status  On-going      PT LONG TERM GOAL #3   Title  Patient will exhibit a 10 point improvement in FOTO score to lesser than or equal to 52% limitation.    Baseline  62% limited    Time  6    Period  Weeks    Status  On-going      PT LONG TERM GOAL #4   Title  Patient will be able to transfer from floor to stand to play with grandchildren with minimal c/o rt knee pain.    Time  6    Period  Weeks    Status  On-going      PT LONG TERM GOAL #5   Title  Patient will be able to walk throughout large [employer] store without c/o increased pain.     Baseline  increased pain with activity    Time  6    Period  Weeks    Status  On-going            Plan - 02/11/18 0930    Clinical Impression Statement  Session focus with functional strengthening, balalnce traning and manual technqiues to address edema present proximal knee.  Added STS and increased height with forward step-ups for functional strengthening as well as tandem stance for balalnce training.  EOS with manual retrograde massage for edema control.  No reports of pain through session.      Rehab Potential  Fair    Clinical Impairments Affecting Rehab Potential  BMI 46, also has left knee pain without injury    PT Frequency  3x / week    PT Duration  6 weeks    PT Treatment/Interventions  Aquatic Therapy;Biofeedback;Cryotherapy;Electrical Stimulation;Ultrasound;Moist Heat;Iontophoresis 46m/ml Dexamethasone;Gait training;Functional mobility training;Therapeutic activities;Therapeutic exercise;Balance training;Patient/family education;Stair  training;Neuromuscular re-education;Manual techniques;Passive range of motion;Dry needling;Energy conservation;Taping    PT Next Visit Plan  Begin lunges next session and progress general LE strengthening and balance training as able.      PT Home Exercise Plan  02/04/18: QS, SAQ, bridges, LAQ, hip abd    Consulted and Agree with Plan of Care  Patient       Patient will  benefit from skilled therapeutic intervention in order to improve the following deficits and impairments:  Abnormal gait, Pain, Decreased activity tolerance, Decreased strength, Decreased range of motion, Decreased balance, Difficulty walking, Increased edema, Obesity  Visit Diagnosis: Unsteadiness on feet  Other abnormalities of gait and mobility  Chronic pain of right knee  Localized edema     Problem List There are no active problems to display for this patient.  [address] LPTA; CBIS [phone]  Aldona Lento 02/11/2018, 9:45 AM  [place], [place], [address] Phone: [phone]   Fax:  [phone]  Name: SHAREN YOUNGREN MRN: [id number] Date of Birth: 1956-01-29

## 2018-02-13 ENCOUNTER — Ambulatory Visit (HOSPITAL_COMMUNITY): Payer: BC Managed Care – PPO

## 2018-02-13 ENCOUNTER — Encounter (HOSPITAL_COMMUNITY): Payer: Self-pay

## 2018-02-13 DIAGNOSIS — R2681 Unsteadiness on feet: Secondary | ICD-10-CM | POA: Diagnosis not present

## 2018-02-13 DIAGNOSIS — G8929 Other chronic pain: Secondary | ICD-10-CM

## 2018-02-13 DIAGNOSIS — R2689 Other abnormalities of gait and mobility: Secondary | ICD-10-CM

## 2018-02-13 DIAGNOSIS — R600 Localized edema: Secondary | ICD-10-CM

## 2018-02-13 DIAGNOSIS — M25561 Pain in right knee: Secondary | ICD-10-CM

## 2018-02-13 NOTE — Therapy (Signed)
[facility] [address] [place], [place], [address] Phone: [phone]   Fax:  [phone]  Physical Therapy Treatment  Patient Details  Name: Samantha Larsen MRN: [id number] Date of Birth: 03/20/1956 Referring Provider: Adonis Huguenin   Encounter Date: 02/13/2018  PT End of Session - 02/13/18 1433    Visit Number  6    Number of Visits  18    Date for PT Re-Evaluation  03/18/18    Authorization Type  [place]    Authorization Time Period  80/20 coins. $1250 deduct; $4890 OOP - $495.30 met; PT/OT/SLP no limit; medical necessity    Authorization - Visit Number  6    Authorization - Number of Visits  10    PT Start Time  1432    PT Stop Time  1515    PT Time Calculation (min)  43 min    Activity Tolerance  Patient tolerated treatment well    Behavior During Therapy  WFL for tasks assessed/performed       History reviewed. No pertinent past medical history.  Past Surgical History:  Procedure Laterality Date  . ABDOMINAL HYSTERECTOMY    . COLONOSCOPY N/A 05/18/2014   Procedure: COLONOSCOPY;  Surgeon: Rogene Houston, MD;  [facility];  Service: Endoscopy;  Laterality: N/A;  1200  . DILATION AND CURETTAGE OF UTERUS     x 2  . lasik    . TUBAL LIGATION      There were no vitals filed for this visit.  Subjective Assessment - 02/13/18 1433    Subjective  Pt stated knee is feeling good today, reports main discomfort following standing or walking for long periods of time.      Pertinent History  NSAID allergy    Patient Stated Goals  walk around on a cruise; less pain for daily activities    Currently in Pain?  No/denies                       [facility] Adult PT Treatment/Exercise - 02/13/18 0001      Knee/Hip Exercises: Standing   Heel Raises  20 reps    Heel Raises Limitations  toe raises 20x    Forward Lunges  Both;10 reps;3 seconds;Limitations    Forward Lunges Limitations  onto 6in  step    Lateral Step Up  Left;Hand Hold: 1;Step Height: 4";15 reps    Forward Step Up  Right;15 reps;Hand Hold: 1;Step Height: 6"    Step Down  Right;10 reps;Step Height: 4";Hand Hold: 1    Functional Squat  15 reps    SLS with Vectors  bilaterally 5X5" each with 1 UE assist    Other Standing Knee Exercises  tandem stance on foam 2x 30"; 1 set palvo set with RTB 10x each    Other Standing Knee Exercises  side step with RTB 2RT      Manual Therapy   Manual Therapy  Edema management    Manual therapy comments  Manual complete separate than rest of tx    Edema Management  retrograde massage with LE elevated               PT Short Term Goals - 02/06/18 1153      PT SHORT TERM GOAL #1   Title  Patient will regularly perform and demonstrate competency in performance of an initial HEP.     Time  3    Status  On-going      PT SHORT TERM GOAL #2   Title  Patient will be able to don and doff shoes without complaints of pain.    Baseline  a little limited    Time  3    Period  Weeks    Status  On-going      PT SHORT TERM GOAL #3   Title  Patient will report no greater than 4/10 pain with activitiy throughout the day.    Baseline  7/10    Time  3    Period  Weeks    Status  On-going      PT SHORT TERM GOAL #4   Title  Patient will exhibit a 5 point improvement in FOTO score to lesser than or equal to 57% limitation.    Baseline  62%    Time  3    Period  Weeks    Status  On-going        PT Long Term Goals - 02/06/18 1153      PT LONG TERM GOAL #1   Title  Patient will regularly perform and demonstrate competency in performance of an advanced HEP.     Time  6    Period  Weeks    Status  On-going      PT LONG TERM GOAL #2   Title  Patient will report no greater than 2/10 pain with activitiy throughout the day.    Baseline  7/10    Time  6    Period  Weeks    Status  On-going      PT LONG TERM GOAL #3   Title  Patient will exhibit a 10 point improvement in FOTO  score to lesser than or equal to 52% limitation.    Baseline  62% limited    Time  6    Period  Weeks    Status  On-going      PT LONG TERM GOAL #4   Title  Patient will be able to transfer from floor to stand to play with grandchildren with minimal c/o rt knee pain.    Time  6    Period  Weeks    Status  On-going      PT LONG TERM GOAL #5   Title  Patient will be able to walk throughout large [employer] store without c/o increased pain.     Baseline  increased pain with activity    Time  6    Period  Weeks    Status  On-going            Plan - 02/13/18 1515    Clinical Impression Statement  Session focus on functional strengthening, balance training and manual techniques to address edema present proximal knee.  Added lunges and sidestepping for quad and gluteal strengthening with good form/mechanics demonstrated.  EOS with manual retrograde massage for edema control.  Pt reports pain scale 3/10 due to standing long periods of time.      Rehab Potential  Fair    Clinical Impairments Affecting Rehab Potential  BMI 46, also has left knee pain without injury    PT Frequency  3x / week    PT Duration  6 weeks    PT Treatment/Interventions  Aquatic Therapy;Biofeedback;Cryotherapy;Electrical Stimulation;Ultrasound;Moist Heat;Iontophoresis 26m/ml Dexamethasone;Gait training;Functional mobility training;Therapeutic activities;Therapeutic exercise;Balance training;Patient/family education;Stair training;Neuromuscular re-education;Manual techniques;Passive range of motion;Dry needling;Energy conservation;Taping    PT Next Visit Plan  Resume TKE and progress quad and gluteal strengtheing as  able, balance training and manual for edema control.  F/U with compression hose.      PT Home Exercise Plan  02/04/18: QS, SAQ, bridges, LAQ, hip abd       Patient will benefit from skilled therapeutic intervention in order to improve the following deficits and impairments:  Abnormal gait, Pain, Decreased  activity tolerance, Decreased strength, Decreased range of motion, Decreased balance, Difficulty walking, Increased edema, Obesity  Visit Diagnosis: Unsteadiness on feet  Other abnormalities of gait and mobility  Chronic pain of right knee  Localized edema     Problem List There are no active problems to display for this patient.  [address], LPTA; [place]  Aldona Lento 02/13/2018, 3:21 PM  [place], [place], [address] Phone: [phone]   Fax:  [phone]  Name: KASHAWN MANZANO MRN: [id number] Date of Birth: 02/04/1956

## 2018-02-16 ENCOUNTER — Ambulatory Visit (HOSPITAL_COMMUNITY): Payer: BC Managed Care – PPO

## 2018-02-16 ENCOUNTER — Telehealth (HOSPITAL_COMMUNITY): Payer: Self-pay | Admitting: Physical Therapy

## 2018-02-16 DIAGNOSIS — R2681 Unsteadiness on feet: Secondary | ICD-10-CM

## 2018-02-16 DIAGNOSIS — R600 Localized edema: Secondary | ICD-10-CM

## 2018-02-16 DIAGNOSIS — R2689 Other abnormalities of gait and mobility: Secondary | ICD-10-CM

## 2018-02-16 DIAGNOSIS — G8929 Other chronic pain: Secondary | ICD-10-CM

## 2018-02-16 DIAGNOSIS — M25561 Pain in right knee: Secondary | ICD-10-CM

## 2018-02-16 NOTE — Telephone Encounter (Signed)
PT ONLY NEEDS TWO TIMES THIS WEEK

## 2018-02-16 NOTE — Therapy (Signed)
[facility] [address] [place], [place], [address] Phone: [phone]   Fax:  [phone]  Physical Therapy Treatment  Patient Details  Name: Samantha Larsen MRN: [id number] Date of Birth: Jan 06, 1956 Referring Provider: Adonis Huguenin   Encounter Date: 02/16/2018  PT End of Session - 02/16/18 0814    Visit Number  7    Number of Visits  18    Date for PT Re-Evaluation  03/18/18    Authorization Type  [place]    Authorization Time Period  80/20 coins. $1250 deduct; $4890 OOP - $495.30 met; PT/OT/SLP no limit; medical necessity    Authorization - Visit Number  7    Authorization - Number of Visits  10    PT Start Time  0815    PT Stop Time  0855    PT Time Calculation (min)  40 min    Activity Tolerance  Patient tolerated treatment well    Behavior During Therapy  [facility] for tasks assessed/performed       No past medical history on file.  Past Surgical History:  Procedure Laterality Date  . ABDOMINAL HYSTERECTOMY    . COLONOSCOPY N/A 05/18/2014   Procedure: COLONOSCOPY;  Surgeon: Rogene Houston, MD;  [facility];  Service: Endoscopy;  Laterality: N/A;  1200  . DILATION AND CURETTAGE OF UTERUS     x 2  . lasik    . TUBAL LIGATION      There were no vitals filed for this visit.  Subjective Assessment - 02/16/18 0815    Subjective  Pt reports that her knee is feeling good this morning. Had a little bit of pain with stepping up on the curb but nothing at the moment.    Pertinent History  NSAID allergy    Patient Stated Goals  walk around on a cruise; less pain for daily activities    Currently in Pain?  No/denies           [facility] Adult PT Treatment/Exercise - 02/16/18 0001      Knee/Hip Exercises: Stretches   Active Hamstring Stretch  Right;3 reps;30 seconds    Active Hamstring Stretch Limitations  standing 12" step    Gastroc Stretch  Both;3 reps;30 seconds    Gastroc Stretch Limitations   slant board      Knee/Hip Exercises: Standing   Heel Raises  Both;20 reps    Heel Raises Limitations  heel and toe on slant board    Forward Lunges  Both;15 reps    Forward Lunges Limitations  onto 6in step    Terminal Knee Extension  Right    Terminal Knee Extension Limitations  10x10" holds with small pink ball against wall    Hip Abduction  Both;2 sets;15 reps    Abduction Limitations  RTB    Wall Squat  2 sets;10 reps    SLS  on foam 5x10" BLE, intermittent UE     Other Standing Knee Exercises  fwd tandem gait firm x2RT    Other Standing Knee Exercises  side step with RTB 2RT      Manual Therapy   Manual Therapy  Edema management    Manual therapy comments  Manual complete separate than rest of tx    Edema Management  retrograde massage with LE elevated           PT Education - 02/16/18 0815    Education Details  exercise technique, continue HEP  Person(s) Educated  Patient    Methods  Explanation;Demonstration    Comprehension  Verbalized understanding;Returned demonstration       PT Short Term Goals - 02/06/18 1153      PT SHORT TERM GOAL #1   Title  Patient will regularly perform and demonstrate competency in performance of an initial HEP.     Time  3    Status  On-going      PT SHORT TERM GOAL #2   Title  Patient will be able to don and doff shoes without complaints of pain.    Baseline  a little limited    Time  3    Period  Weeks    Status  On-going      PT SHORT TERM GOAL #3   Title  Patient will report no greater than 4/10 pain with activitiy throughout the day.    Baseline  7/10    Time  3    Period  Weeks    Status  On-going      PT SHORT TERM GOAL #4   Title  Patient will exhibit a 5 point improvement in FOTO score to lesser than or equal to 57% limitation.    Baseline  62%    Time  3    Period  Weeks    Status  On-going        PT Long Term Goals - 02/06/18 1153      PT LONG TERM GOAL #1   Title  Patient will regularly perform and  demonstrate competency in performance of an advanced HEP.     Time  6    Period  Weeks    Status  On-going      PT LONG TERM GOAL #2   Title  Patient will report no greater than 2/10 pain with activitiy throughout the day.    Baseline  7/10    Time  6    Period  Weeks    Status  On-going      PT LONG TERM GOAL #3   Title  Patient will exhibit a 10 point improvement in FOTO score to lesser than or equal to 52% limitation.    Baseline  62% limited    Time  6    Period  Weeks    Status  On-going      PT LONG TERM GOAL #4   Title  Patient will be able to transfer from floor to stand to play with grandchildren with minimal c/o rt knee pain.    Time  6    Period  Weeks    Status  On-going      PT LONG TERM GOAL #5   Title  Patient will be able to walk throughout large [employer] store without c/o increased pain.     Baseline  increased pain with activity    Time  6    Period  Weeks    Status  On-going            Plan - 02/16/18 8280    Clinical Impression Statement  Pt progressing nicely overall as she is reporting decreased pain and improved function. Continued with BLE and functional strengthening along with balance training. Resumed TKE for quad strengthening and added wall slides for progressed strengthening. Added tandem gait and progressed SLS to foam for progressed balance challenge. No pain reported during therex. Ended with manual to address edema and for pain control. Overall pt is progressing well and PT decreased  her frequency to 2x/week to begin to allow her to manage her pain more independently at home; continue POC as planned progressing as able.     Rehab Potential  Fair    Clinical Impairments Affecting Rehab Potential  BMI 46, also has left knee pain without injury    PT Frequency  3x / week    PT Duration  6 weeks    PT Treatment/Interventions  Aquatic Therapy;Biofeedback;Cryotherapy;Electrical Stimulation;Ultrasound;Moist Heat;Iontophoresis 67m/ml  Dexamethasone;Gait training;Functional mobility training;Therapeutic activities;Therapeutic exercise;Balance training;Patient/family education;Stair training;Neuromuscular re-education;Manual techniques;Passive range of motion;Dry needling;Energy conservation;Taping    PT Next Visit Plan  continue TKE and progress quad and gluteal strengtheing as able, balance training and manual for edema control.  F/U with compression hose.      PT Home Exercise Plan  02/04/18: QS, SAQ, bridges, LAQ, hip abd    Consulted and Agree with Plan of Care  Patient       Patient will benefit from skilled therapeutic intervention in order to improve the following deficits and impairments:  Abnormal gait, Pain, Decreased activity tolerance, Decreased strength, Decreased range of motion, Decreased balance, Difficulty walking, Increased edema, Obesity  Visit Diagnosis: Unsteadiness on feet  Other abnormalities of gait and mobility  Chronic pain of right knee  Localized edema     Problem List There are no active problems to display for this patient.     BGeraldine SolarPT, DAltamont7693 High Point StreetSSatanta NAl[place] [address]Phone: [phone]  Fax:  [phone] Name: Samantha VITELLIMRN: [id number] of Birth: 112-25-1957

## 2018-02-18 ENCOUNTER — Encounter (HOSPITAL_COMMUNITY): Payer: BC Managed Care – PPO | Admitting: Physical Therapy

## 2018-02-20 ENCOUNTER — Ambulatory Visit (HOSPITAL_COMMUNITY): Payer: BC Managed Care – PPO

## 2018-02-20 ENCOUNTER — Encounter (HOSPITAL_COMMUNITY): Payer: Self-pay

## 2018-02-20 ENCOUNTER — Telehealth (HOSPITAL_COMMUNITY): Payer: Self-pay | Admitting: Physical Therapy

## 2018-02-20 DIAGNOSIS — G8929 Other chronic pain: Secondary | ICD-10-CM

## 2018-02-20 DIAGNOSIS — R2689 Other abnormalities of gait and mobility: Secondary | ICD-10-CM

## 2018-02-20 DIAGNOSIS — M25561 Pain in right knee: Secondary | ICD-10-CM

## 2018-02-20 DIAGNOSIS — R600 Localized edema: Secondary | ICD-10-CM

## 2018-02-20 DIAGNOSIS — R2681 Unsteadiness on feet: Secondary | ICD-10-CM | POA: Diagnosis not present

## 2018-02-20 NOTE — Patient Instructions (Signed)
Knee Extension: Terminal - Standing (Single Leg)    Face anchor in shoulder width stance, band around knee. Allow tension of band to slightly bend knee. Pull leg back, straightening knee. Repeat 15 times per set. Repeat with other leg. Do 1 sets per session. Do 3-4 sessions per week. Anchor Height: Knee  http://tub.exer.us/36   Copyright  VHI. All rights reserved.   Band Walk: Side Stepping    Tie band around legs, just above knees. Step ___ feet to one side, then step back to start. Repeat 20 feet per session. Note: Small towel between band and skin eases rubbing.  http://plyo.exer.us/76   Copyright  VHI. All rights reserved.

## 2018-02-20 NOTE — Telephone Encounter (Signed)
Pt is doing better and will cut back to 2xwk a week

## 2018-02-20 NOTE — Therapy (Signed)
[facility] [address] [place], [place], [address] Phone: [phone]   Fax:  [phone]  Physical Therapy Treatment  Patient Details  Name: Samantha Larsen MRN: [id number] Date of Birth: April 28, 1956 Referring Provider: Adonis Huguenin   Encounter Date: 02/20/2018  PT End of Session - 02/20/18 0910    Visit Number  8    Number of Visits  18    Date for PT Re-Evaluation  03/18/18 [place] 02/25/18    Authorization Type  [place]; 80/20 coins. $1250 deduct; $4890 OOP - $495.30 met; PT/OT/SLP no limit; medical necessity    Authorization Time Period  cert: 01/77-->9/39/03    Authorization - Visit Number  8    Authorization - Number of Visits  10    PT Start Time  0817    PT Stop Time  0905    PT Time Calculation (min)  48 min    Activity Tolerance  Patient tolerated treatment well    Behavior During Therapy  [facility] for tasks assessed/performed       History reviewed. No pertinent past medical history.  Past Surgical History:  Procedure Laterality Date  . ABDOMINAL HYSTERECTOMY    . COLONOSCOPY N/A 05/18/2014   Procedure: COLONOSCOPY;  Surgeon: Rogene Houston, MD;  [facility];  Service: Endoscopy;  Laterality: N/A;  1200  . DILATION AND CURETTAGE OF UTERUS     x 2  . lasik    . TUBAL LIGATION      There were no vitals filed for this visit.  Subjective Assessment - 02/20/18 0817    Subjective  Pt reports she has returned to normal ADL, went shopping and stood up for several hours with reports of pain following standing/walking for length of time, current pain scale 1-2/10    Patient Stated Goals  walk around on a cruise; less pain for daily activities    Currently in Pain?  Yes    Pain Score  2     Pain Location  Knee    Pain Orientation  Right    Pain Descriptors / Indicators  Dull;Aching;Discomfort    Pain Type  Chronic pain    Pain Onset  More than a month ago    Pain Frequency  Constant     Aggravating Factors   weight bearing    Pain Relieving Factors  ice and rest    Effect of Pain on Daily Activities  limits                       [facility] Adult PT Treatment/Exercise - 02/20/18 0001      Knee/Hip Exercises: Standing   Heel Raises  Both;20 reps    Heel Raises Limitations  heel and toe on slant board    Forward Lunges  Both;15 reps    Forward Lunges Limitations  onto 6in step    Terminal Knee Extension  Right;15 reps;Theraband    Theraband Level (Terminal Knee Extension)  Level 3 (Green)    Lateral Step Up  Right;Hand Hold: 2;15 reps;Step Height: 6"    Forward Step Up  Right;15 reps;Hand Hold: 1;Step Height: 6"    Step Down  Right;10 reps;Hand Hold: 1;Step Height: 6" cueing for eccentric control    Wall Squat  2 sets;10 reps    SLS  on foam 5x10" BLE, intermittent UE; Rt 1', Lt 50":    SLS with Vectors  on foam bilaterally 5X5" each with  1 UE assist    Other Standing Knee Exercises  fwd tandem gait firm x2RT    Other Standing Knee Exercises  side step with RTB 2RT      Manual Therapy   Manual Therapy  Edema management    Manual therapy comments  Manual complete separate than rest of tx    Edema Management  retrograde massage with LE elevated               PT Short Term Goals - 02/06/18 1153      PT SHORT TERM GOAL #1   Title  Patient will regularly perform and demonstrate competency in performance of an initial HEP.     Time  3    Status  On-going      PT SHORT TERM GOAL #2   Title  Patient will be able to don and doff shoes without complaints of pain.    Baseline  a little limited    Time  3    Period  Weeks    Status  On-going      PT SHORT TERM GOAL #3   Title  Patient will report no greater than 4/10 pain with activitiy throughout the day.    Baseline  7/10    Time  3    Period  Weeks    Status  On-going      PT SHORT TERM GOAL #4   Title  Patient will exhibit a 5 point improvement in FOTO score to lesser than or equal to 57%  limitation.    Baseline  62%    Time  3    Period  Weeks    Status  On-going        PT Long Term Goals - 02/06/18 1153      PT LONG TERM GOAL #1   Title  Patient will regularly perform and demonstrate competency in performance of an advanced HEP.     Time  6    Period  Weeks    Status  On-going      PT LONG TERM GOAL #2   Title  Patient will report no greater than 2/10 pain with activitiy throughout the day.    Baseline  7/10    Time  6    Period  Weeks    Status  On-going      PT LONG TERM GOAL #3   Title  Patient will exhibit a 10 point improvement in FOTO score to lesser than or equal to 52% limitation.    Baseline  62% limited    Time  6    Period  Weeks    Status  On-going      PT LONG TERM GOAL #4   Title  Patient will be able to transfer from floor to stand to play with grandchildren with minimal c/o rt knee pain.    Time  6    Period  Weeks    Status  On-going      PT LONG TERM GOAL #5   Title  Patient will be able to walk throughout large [employer] store without c/o increased pain.     Baseline  increased pain with activity    Time  6    Period  Weeks    Status  On-going            Plan - 02/20/18 0912    Clinical Impression Statement  Pt reports she has increased ADL activities and reports increased pain following  prolonged standing/walking.  Continued session focus with functional strengthening and balance training.  Pt progressing well with improved SLS timing and able to progress functional strengthening activities.  Increased step height wiht lateral and step down, pt wiht increased difficulty with step down due to impaired eccentric control, no reports of increased pain following exercise.  Pt continues to exhibit edema present proximal knee especially medial aspect.  EOS with manual technqiues to address edema for pain control.  Pt able to complete all exercises correctly with minimal cueing, added standing TKE and sidestep for quad and gluteal  strengthening.      Clinical Impairments Affecting Rehab Potential  BMI 46, also has left knee pain without injury    PT Frequency  3x / week    PT Duration  6 weeks    PT Treatment/Interventions  Aquatic Therapy;Biofeedback;Cryotherapy;Electrical Stimulation;Ultrasound;Moist Heat;Iontophoresis 65m/ml Dexamethasone;Gait training;Functional mobility training;Therapeutic activities;Therapeutic exercise;Balance training;Patient/family education;Stair training;Neuromuscular re-education;Manual techniques;Passive range of motion;Dry needling;Energy conservation;Taping    PT Next Visit Plan  continue TKE and progress quad and gluteal strengtheing as able, balance training and manual for edema control.  F/U with compression hose.      PT Home Exercise Plan  02/04/18: QS, SAQ, bridges, LAQ, hip abd; 02/20/18: standing TKE and sidestep       Patient will benefit from skilled therapeutic intervention in order to improve the following deficits and impairments:  Abnormal gait, Pain, Decreased activity tolerance, Decreased strength, Decreased range of motion, Decreased balance, Difficulty walking, Increased edema, Obesity  Visit Diagnosis: Unsteadiness on feet  Other abnormalities of gait and mobility  Chronic pain of right knee  Localized edema     Problem List There are no active problems to display for this patient.  [address] LPTA; CLake Mary Ronan CAldona Lento6/28/2019, 9:18 AM  [place] [address]Phone: [phone]  Fax:  [phone] Name: SLINEA CALLESMRN: [id number] of Birth: 1January 07, 1957

## 2018-02-23 ENCOUNTER — Other Ambulatory Visit: Payer: Self-pay

## 2018-02-23 ENCOUNTER — Ambulatory Visit (HOSPITAL_COMMUNITY): Payer: BC Managed Care – PPO | Attending: Orthopedic Surgery | Admitting: Physical Therapy

## 2018-02-23 ENCOUNTER — Encounter (HOSPITAL_COMMUNITY): Payer: Self-pay | Admitting: Physical Therapy

## 2018-02-23 DIAGNOSIS — R2681 Unsteadiness on feet: Secondary | ICD-10-CM

## 2018-02-23 DIAGNOSIS — R600 Localized edema: Secondary | ICD-10-CM | POA: Diagnosis present

## 2018-02-23 DIAGNOSIS — G8929 Other chronic pain: Secondary | ICD-10-CM

## 2018-02-23 DIAGNOSIS — R2689 Other abnormalities of gait and mobility: Secondary | ICD-10-CM | POA: Diagnosis present

## 2018-02-23 DIAGNOSIS — M25561 Pain in right knee: Secondary | ICD-10-CM | POA: Diagnosis present

## 2018-02-23 NOTE — Therapy (Signed)
[place], [place], [address] Phone: [phone]   Fax:  [phone]  Physical Therapy Treatment/Discharge   Patient Details  Name: Samantha Larsen MRN: [id number] Date of Birth: 29-May-1956 Referring Provider: Arther Abbott    Encounter Date: 02/23/2018  PT End of Session - 02/23/18 0947    Visit Number  9    Number of Visits  9    Date for PT Re-Evaluation  03/18/18 Min[place]assess 02/25/18    Authorization Type  [place]; 80/20 coins. $1250 deduct; $4890 OOP - $495.30 met; PT/OT/SLP no limit; medical necessity    Authorization Time Period  cert: 83/29-->1/91/66    Authorization - Visit Number  9    Authorization - Number of Visits  10    PT Start Time  0900    PT Stop Time  0945    PT Time Calculation (min)  45 min    Activity Tolerance  Patient tolerated treatment well    Behavior During Therapy  [facility] for tasks assessed/performed       History reviewed. No pertinent past medical history.  Past Surgical History:  Procedure Laterality Date  . ABDOMINAL HYSTERECTOMY    . COLONOSCOPY N/A 05/18/2014   Procedure: COLONOSCOPY;  Surgeon: Rogene Houston, MD;  [facility];  Service: Endoscopy;  Laterality: N/A;  1200  . DILATION AND CURETTAGE OF UTERUS     x 2  . lasik    . TUBAL LIGATION      There were no vitals filed for this visit.  Subjective Assessment - 02/23/18 0859    Subjective  Pt states that she is much better.  She is doing her exercises at home.  She is still having a pulling in the back and pain on the inside of her knee     How long can you sit comfortably?  no problem     How long can you stand comfortably?  30-45 minutes was 15-20     How long can you walk comfortably?  Pt is able to walk a couple of blocks now prior to her knee bothering her; was o block     Patient Stated Goals  walk around on a cruise; less pain for daily activities    Currently in Pain?  No/denies  past week the highest pain has been a 4/10     Pain Onset  More than a month ago         [facility] PT Assessment - 02/23/18 0001      Assessment   Medical Diagnosis  Rt knee pain    Referring Provider  Arther Abbott     Onset Date/Surgical Date  10/10/17    Hand Dominance  Right    Next MD Visit  after MRI or PT    Prior Therapy  No      Precautions   Precautions  None      Restrictions   Weight Bearing Restrictions  No      Balance Screen   Has the patient fallen in the past 6 months  No    Has the patient had a decrease in activity level because of a fear of falling?   No    Is the patient reluctant to leave their home because of a fear of falling?   No      Home Film/video editor residence    Living Arrangements  Spouse/significant other  Prior Function   Level of Independence  Independent    Leisure  go on cruises, care for mother-in-law and grandchildren      Cognition   Overall Cognitive Status  Within Functional Limits for tasks assessed      Observation/Other Assessments   Focus on Therapeutic Outcomes (FOTO)   currenly 41% limited 62% limited      Circumferential Edema   Circumferential - Right  61.8 cm    Circumferential - Left   60.5 cm      AROM   Right Knee Extension  0    Right Knee Flexion  117 soft tissue approximation and pain limited      Strength   Right Hip Flexion  5/5 was 5/5     Right Hip Extension  5/5 was 4-/5     Right Hip ABduction  5/5 was 4/5     Left Hip Flexion  5/5 was 4-/5     Left Hip Extension  5/5 was 4/5     Left Hip ABduction  5/5 was 4/5     Right Knee Flexion  4+/5 was 4-/5    Right Knee Extension  5/5 was 4/5      Left Knee Flexion  5/5 was 4/5     Left Knee Extension  5/5 was 4+/5    Right Ankle Dorsiflexion  5/5 was 4+/5     Left Ankle Dorsiflexion  5/5      Transfers   Five time sit to stand comments   11.04 was16.74 sec      Ambulation/Gait   Ambulation Distance (Feet)  508 Feet  3 MWT     Gait Pattern  Step-through pattern;Decreased stance time - right;Decreased weight shift to right    Gait velocity       Balance   Balance Assessed  Yes      Static Standing Balance   Static Standing - Balance Support  No upper extremity supported    Static Standing - Level of Assistance  7: Independent    Static Standing Balance -  Activities   Single Leg Stance - Left Leg;Single Leg Stance - Right Leg    Static Standing - Comment/# of Minutes  LT 60 seconds; was 10 seconds; Rt 25sconds was 11                   [facility] Adult PT Treatment/Exercise - 02/23/18 0001      Knee/Hip Exercises: Standing   SLS  5x B       Knee/Hip Exercises: Seated   Sit to Sand  10 reps             PT Education - 02/23/18 0945    Education Details  Educated pt in self massaging techniques as well as the importance of completing balance activity everyday for the rest of her life.     Person(s) Educated  Patient    Methods  Explanation    Comprehension  Verbalized understanding       PT Short Term Goals - 02/23/18 0933      PT SHORT TERM GOAL #1   Title  Patient will regularly perform and demonstrate competency in performance of an initial HEP.     Time  3    Status  Achieved      PT SHORT TERM GOAL #2   Title  Patient will be able to don and doff shoes without complaints of pain.    Baseline  a little  limited    Time  3    Period  Weeks    Status  Achieved      PT SHORT TERM GOAL #3   Title  Patient will report no greater than 4/10 pain with activitiy throughout the day.    Baseline  7/10    Time  3    Period  Weeks    Status  Achieved      PT SHORT TERM GOAL #4   Title  Patient will exhibit a 5 point improvement in FOTO score to lesser than or equal to 57% limitation.    Baseline  62%    Time  3    Period  Weeks    Status  Achieved        PT Long Term Goals - 02/23/18 0934      PT LONG TERM GOAL #1   Title  Patient will regularly perform and  demonstrate competency in performance of an advanced HEP.     Time  6    Period  Weeks    Status  Achieved      PT LONG TERM GOAL #2   Title  Patient will report no greater than 2/10 pain with activitiy throughout the day.    Baseline  7/10    Time  6    Period  Weeks    Status  Partially Met      PT LONG TERM GOAL #3   Title  Patient will exhibit a 10 point improvement in FOTO score to lesser than or equal to 52% limitation.    Baseline  62% limited    Time  6    Period  Weeks    Status  Achieved      PT LONG TERM GOAL #4   Title  Patient will be able to transfer from floor to stand to play with grandchildren with minimal c/o rt knee pain.    Time  6    Period  Weeks    Status  Achieved      PT LONG TERM GOAL #5   Title  Patient will be able to walk throughout large gracery store without c/o increased pain.     Baseline  increased pain with activity    Time  6    Period  Weeks    Status  Achieved            Plan - 02/23/18 0948    Clinical Impression Statement  Pt reassessed today with all goals met.  Pt states that she feels that she is ready for discharge.  Pt still has increased edema and tenderness in medial and postrerior aspect of knee.  Therapist explained how to and theory of self massaging also encouraged both ice and heat.      Clinical Impairments Affecting Rehab Potential  BMI 46, also has left knee pain without injury    PT Frequency  3x / week    PT Duration  6 weeks    PT Treatment/Interventions  Aquatic Therapy;Biofeedback;Cryotherapy;Electrical Stimulation;Ultrasound;Moist Heat;Iontophoresis 1m/ml Dexamethasone;Gait training;Functional mobility training;Therapeutic activities;Therapeutic exercise;Balance training;Patient/family education;Stair training;Neuromuscular re-education;Manual techniques;Passive range of motion;Dry needling;Energy conservation;Taping    PT Next Visit Plan  Discharge to HEP     PT Home Exercise Plan  02/04/18: QS, SAQ, bridges,  LAQ, hip abd; 02/20/18: standing TKE and sidestep       Patient will benefit from skilled therapeutic intervention in order to improve the following deficits and impairments:  Abnormal gait, Pain, Decreased  activity tolerance, Decreased strength, Decreased range of motion, Decreased balance, Difficulty walking, Increased edema, Obesity  Visit Diagnosis: Unsteadiness on feet  Other abnormalities of gait and mobility  Chronic pain of right knee  Localized edema     Problem List There are no active problems to display for this patient. Rayetta Humphrey, PT CLT [phone] 02/23/2018, 9:50 AM  [place], [place], [address] Phone: [phone]   Fax:  [phone]  Name: Samantha Larsen MRN: [id number] Date of Birth: October 17, 1955 PHYSICAL THERAPY DISCHARGE SUMMARY  Visits from Start of Care: 9  Current functional level related to goals / functional outcomes: See above    Remaining deficits: Still has some soreness but functional ability has improved greatly    Education / Equipment: HEP  Plan: Patient agrees to discharge.  Patient goals were met. Patient is being discharged due to meeting the stated rehab goals.  ?????        Rayetta Humphrey, [place] CLT [phone]

## 2018-02-25 ENCOUNTER — Encounter (HOSPITAL_COMMUNITY): Payer: BC Managed Care – PPO | Admitting: Physical Therapy

## 2018-02-27 ENCOUNTER — Encounter (HOSPITAL_COMMUNITY): Payer: BC Managed Care – PPO

## 2018-03-02 ENCOUNTER — Encounter (HOSPITAL_COMMUNITY): Payer: BC Managed Care – PPO

## 2018-03-04 ENCOUNTER — Encounter (HOSPITAL_COMMUNITY): Payer: BC Managed Care – PPO | Admitting: Physical Therapy

## 2018-03-06 ENCOUNTER — Encounter (HOSPITAL_COMMUNITY): Payer: BC Managed Care – PPO

## 2018-03-09 ENCOUNTER — Encounter (HOSPITAL_COMMUNITY): Payer: BC Managed Care – PPO | Admitting: Physical Therapy

## 2018-03-11 ENCOUNTER — Encounter (HOSPITAL_COMMUNITY): Payer: BC Managed Care – PPO | Admitting: Physical Therapy

## 2018-03-13 ENCOUNTER — Encounter (HOSPITAL_COMMUNITY): Payer: BC Managed Care – PPO

## 2018-03-30 ENCOUNTER — Telehealth: Payer: Self-pay | Admitting: Orthopedic Surgery

## 2018-03-30 NOTE — Telephone Encounter (Signed)
Patient is asking if Dr. Aline Brochure can go ahead and put an order in for a MRI of her right knee or does she need to come back in for a visit. I have scheduled her, but told her that I would put in a note about her question.  Please call and advise

## 2018-03-31 NOTE — Telephone Encounter (Signed)
She will have to come in , she has [employer] and will need new visit and documentation to support her need for MRI scan

## 2018-04-20 ENCOUNTER — Ambulatory Visit: Payer: BC Managed Care – PPO | Admitting: Orthopedic Surgery

## 2018-04-20 ENCOUNTER — Telehealth: Payer: Self-pay | Admitting: Radiology

## 2018-04-20 ENCOUNTER — Encounter: Payer: Self-pay | Admitting: Orthopedic Surgery

## 2018-04-20 VITALS — BP 138/78 | HR 67 | Ht 59.0 in | Wt 224.0 lb

## 2018-04-20 DIAGNOSIS — M23321 Other meniscus derangements, posterior horn of medial meniscus, right knee: Secondary | ICD-10-CM

## 2018-04-20 DIAGNOSIS — Z6841 Body Mass Index (BMI) 40.0 and over, adult: Secondary | ICD-10-CM

## 2018-04-20 DIAGNOSIS — M1711 Unilateral primary osteoarthritis, right knee: Secondary | ICD-10-CM

## 2018-04-20 NOTE — Telephone Encounter (Signed)
I called patient to advise MRI has been approved for [facility] and gave her the number to call and schedule, then call us to make a follow up appointment

## 2018-04-20 NOTE — Progress Notes (Signed)
Progress Note   Patient ID: Samantha Larsen, female   DOB: Aug 08, 1956, 62 y.o.   MRN: [id number]   Chief Complaint  Patient presents with  . Knee Pain    F/u PT right knee     62 years old presents in follow-up for continued pain right knee.  The patient has had her 6-week plus trial of tramadol.  She was intolerant to NSAIDs secondary to severe allergy and she also had physical therapy she did not improve complains of continued pain medial aspect right knee  Back in March she had an episode where she stepped off of a height twisted her knee felt acute pain    Review of Systems  Constitutional: Negative for fever.  Musculoskeletal: Positive for joint pain.  Skin: Negative.  Negative for rash.  Neurological: Negative for tingling.     Allergies  Allergen Reactions  . Ibuprofen Swelling    Eyes swell   . Penicillins Hives     BP 138/78   Pulse 67   Ht 4\' 11"  (1.499 m)   Wt 224 lb (101.6 kg)   BMI 45.24 kg/m   Physical Exam  Constitutional: She is oriented to person, place, and time. She appears well-developed and well-nourished.  Neurological: She is alert and oriented to person, place, and time.  Psychiatric: She has a normal mood and affect. Judgment normal.  Vitals reviewed.  As noted prior exam Tenderness  The patient is experiencing tenderness in the medial joint line.  Range of Motion  Extension:  10 normal  Flexion:  120 normal   Tests  McMurray:  Medial - positive Lateral - negative Varus: negative Valgus: negative Drawer:  Anterior - negative    Posterior - negative  Other  Erythema: absent Scars: absent Sensation: normal Pulse: present Swelling: none Effusion: effusion present   Medical decisions:   Data  Imaging:   Prior x-ray shows medial compartment arthrosis mild  Encounter Diagnoses  Name Primary?  . Primary osteoarthritis of right knee Yes  . Derangement of posterior horn of medial meniscus of right knee   . Body mass index  40.0-44.9, adult ([place])   . Morbid obesity ([place])     PLAN:   Recommend MRI right knee  Continue Ultracet  The patient meets the AMA guidelines for Morbid (severe) obesity with a BMI > 40.0 and I have recommended weight loss.  Diet changes including avoid sweet beverages avoid breads that are not wheat and of ipos is that are not wheat exercise can be done if tolerated  Arther Abbott, MD 04/20/2018 10:45 AM

## 2018-04-25 ENCOUNTER — Ambulatory Visit
Admission: RE | Admit: 2018-04-25 | Discharge: 2018-04-25 | Disposition: A | Discharge status: Home / Self Care | Payer: BC Managed Care – PPO | Source: Ambulatory Visit | Attending: Orthopedic Surgery | Admitting: Orthopedic Surgery

## 2018-04-25 DIAGNOSIS — M1711 Unilateral primary osteoarthritis, right knee: Secondary | ICD-10-CM

## 2018-05-06 ENCOUNTER — Ambulatory Visit: Payer: BC Managed Care – PPO | Admitting: Orthopedic Surgery

## 2018-05-06 ENCOUNTER — Encounter: Payer: Self-pay | Admitting: Orthopedic Surgery

## 2018-05-06 VITALS — BP 175/78 | HR 60 | Ht 59.0 in | Wt 227.0 lb

## 2018-05-06 DIAGNOSIS — Z6841 Body Mass Index (BMI) 40.0 and over, adult: Secondary | ICD-10-CM

## 2018-05-06 DIAGNOSIS — M23321 Other meniscus derangements, posterior horn of medial meniscus, right knee: Secondary | ICD-10-CM

## 2018-05-06 DIAGNOSIS — M1711 Unilateral primary osteoarthritis, right knee: Secondary | ICD-10-CM

## 2018-05-06 NOTE — Progress Notes (Addendum)
PREOP CONSULT/REFERRAL INTRA-OFFICE FROM DR Tyrone Apple   Chief Complaint  Patient presents with  . Knee Pain    right   . Results    review MRI     62 years old presents for follow-up with ongoing medial sided right knee pain.  Patient is NSAID allergic so we tried tramadol for pain.  She did not improve  Her MRI results are back  Initially sent to [place] for consultation by Dr. Sinda Du for evaluation of right knee pain    62 year old female with morbid obesity and NSAID allergy.  Presents with several month (2 months), history of right medial knee pain.  She had a episode where she stepped off height her knee buckled she felt acute pain initially got better with some Tylenol then a second insult to the knee where she hyperextended it and the pain came back it was a wart worse it was nonradiating is confined to the medial compartment its dull its associated with inability to extend the knee and medial swelling.    Review of Systems  All other systems reviewed and are negative.    History reviewed. No pertinent past medical history.  Hypertension diabetes heart disease negative  Past Surgical History:  Procedure Laterality Date  . ABDOMINAL HYSTERECTOMY    . COLONOSCOPY N/A 05/18/2014   Procedure: COLONOSCOPY;  Surgeon: Rogene Houston, MD;  [facility];  Service: Endoscopy;  Laterality: N/A;  1200  . DILATION AND CURETTAGE OF UTERUS     x 2  . lasik    . TUBAL LIGATION      Family History  Problem Relation Age of Onset  . Other Mother   . Dementia Father   . Heart murmur Father   . High blood pressure Father   . Arthritis Father   . High blood pressure Sister   . Arthritis Sister    Social History   Tobacco Use  . Smoking status: Never Smoker  . Smokeless tobacco: Never Used  Substance Use Topics  . Alcohol use: No  . Drug use: No    Allergies  Allergen Reactions  . Ibuprofen Swelling    Eyes swell   . Penicillins Hives     Current  Meds  Medication Sig  . albuterol (PROVENTIL HFA;VENTOLIN HFA) 108 (90 BASE) MCG/ACT inhaler Inhale 1-2 puffs into the lungs every 6 (six) hours as needed for wheezing or shortness of breath.  . fluticasone (FLONASE) 50 MCG/ACT nasal spray Place 2 sprays into both nostrils daily.  . folic acid (FOLVITE) 1 MG tablet Take 1 mg by mouth daily.  . hydrochlorothiazide (HYDRODIURIL) 12.5 MG tablet   . lansoprazole (PREVACID) 30 MG capsule Take 30 mg by mouth daily at 12 noon.  . Multiple Vitamins-Minerals (MULTIVITAMINS THER. W/MINERALS) TABS tablet Take 1 tablet by mouth daily.  . nadolol (CORGARD) 40 MG tablet   . nadolol-bendroflumethiazide (CORZIDE) 40-5 MG per tablet Take 1 tablet by mouth daily.  Marland Kitchen nystatin-triamcinolone (MYCOLOG II) cream Apply 1 application topically 2 (two) times daily.  . Omega-3 Fatty Acids (FISH OIL PO) Take 1 capsule by mouth daily.  Marland Kitchen oxymetazoline (AFRIN) 0.05 % nasal spray Place 1 spray into both nostrils 2 (two) times daily as needed for congestion.  . simvastatin (ZOCOR) 40 MG tablet Take 40 mg by mouth daily.  . traMADol-acetaminophen (ULTRACET) 37.5-325 MG tablet Take 1 tablet by mouth every 4 (four) hours as needed.  . vitamin B-12 (CYANOCOBALAMIN) 1000 MCG tablet Take 1,000  mcg by mouth daily.    BP (!) 175/78   Pulse 60   Ht 4\' 11"  (1.499 m)   Wt 227 lb (103 kg)   BMI 45.85 kg/m   Physical Exam  Constitutional: She is oriented to person, place, and time. She appears well-developed and well-nourished.  Endomorphic  Neurological: She is alert and oriented to person, place, and time.  Psychiatric: She has a normal mood and affect. Judgment normal.  Vitals reviewed.   Ortho Exam   Left knee inspection palpation no significant tenderness or swelling full range of motion ligaments stable strength normal pulses good skin intact lymph nodes negative sensation normal balance good  Right knee medial joint line tenderness mild swelling full range of motion  ligaments stable McMurray's positive strength normal skin excellent pulse good sensation normal no pathologic reflexes  MEDICAL DECISION SECTION  MRI scan was reviewed independent of the report and as the report states I agree that she has cartilage loss patellofemoral joint medial femoral condyle and a torn medial meniscus  Encounter Diagnoses  Name Primary?  . Body mass index 45.0-49.9, adult ([place])   . Morbid obesity ([place])   . Primary osteoarthritis of right knee Yes  . Derangement of posterior horn of medial meniscus of right knee   The patient meets the AMA guidelines for Morbid (severe) obesity with a BMI > 40.0 and I have recommended weight loss.   Results were relayed to the patient options for surgical intervention were entertained and accepted.  The patient no longer wishes to live with the pain or try other nonoperative treatments She understands that she has arthritis she has some bone edema she will continue to have arthritic symptoms after surgery she understands this  PLAN:   FINDINGS: Despite efforts by the technologist and patient, motion artifact is present on today's exam and could not be eliminated. This reduces exam sensitivity and specificity.   MENISCI   Medial meniscus:  Oblique tear of the posterior horn.   Lateral meniscus:  Intact.   LIGAMENTS   Cruciates:  Intact ACL and PCL.   Collaterals: Medial collateral ligament is intact. Lateral collateral ligament complex is intact.   CARTILAGE   Patellofemoral: High-grade partial-thickness cartilage loss over the majority of the patella.   Medial:  Mild partial-thickness cartilage loss without focal defect.   Lateral:  Unremarkable.   Joint: Small joint effusion. Normal Hoffa's fat. No plical thickening.   Popliteal Fossa:  Tiny Baker cyst.  Intact popliteus tendon.   Extensor Mechanism: Intact quadriceps tendon and patellar tendon. Intact medial and lateral patellar retinaculum. Intact MPFL.    Bones: Trace subchondral marrow edema in the peripheral medial compartment. No acute fracture or dislocation. No focal bone lesion.   Other: None.   IMPRESSION: 1. Oblique tear of the medial meniscus posterior horn. 2. Mild medial and patellofemoral compartment osteoarthritis.     Electronically Signed   By: Titus Dubin M.D.   On: 04/26/2018 10:31 CLINICAL DATA:  Twisted knee twice in the last 2 months, persistent pain   EXAM: RIGHT KNEE - COMPLETE 4+ VIEW   COMPARISON:  None.   FINDINGS: The right knee joint spaces are relatively normal with perhaps minimal loss of medial compartment space. The lateral and patellofemoral compartments are well preserved. No fracture is seen. No joint effusion is noted.   IMPRESSION: Very minimal degenerative change with slight loss of medial compartment joint space.     Electronically Signed   By: Windy Canny.D.  On: 12/09/2017 13:39   Surgical procedure planned: Arthroscopy right knee partial medial meniscectomy    The procedure has been fully reviewed with the patient; The risks and benefits of surgery have been discussed and explained and understood. Alternative treatment has also been reviewed, questions were encouraged and answered. The postoperative plan is also been reviewed.  Nonsurgical treatment as described in the history and physical section was attempted and unsuccessful and the patient has agreed to proceed with surgical intervention to improve their situation.  No orders of the defined types were placed in this encounter.   Arther Abbott, MD 05/06/2018 4:37 PM

## 2018-05-06 NOTE — Patient Instructions (Signed)
Knee Arthroscopy Knee arthroscopy is a surgical procedure that is used to examine the inside of your knee joint and repair any damage. The surgeon puts a small, lighted instrument with a camera on the tip (arthroscope) through a small incision in your knee. The camera sends pictures to a monitor in the operating room. Your surgeon uses those pictures to guide the surgical instruments through other incisions to the area of damage. Knee arthroscopy can be used to treat many types of knee problems. It may be used:  To repair a torn ligament.  To repair or remove damaged tissue.  To remove a fluid-filled sac (cyst) from your knee.  Tell a health care provider about:  Any allergies you have.  All medicines you are taking, including vitamins, herbs, eye drops, creams, and over-the-counter medicines.  Any problems you or family members have had with anesthetic medicines.  Any blood disorders you have.  Any surgeries you have had.  Any medical conditions you have. What are the risks? Generally, this is a safe procedure. However, problems may occur, including:  Infection.  Bleeding.  Damage to blood vessels, nerves, or structures of your knee.  A blood clot that forms in your leg and travels to your lung.  Failure to relieve symptoms.  What happens before the procedure?  Ask your health care provider about: ? Changing or stopping your regular medicines. This is especially important if you are taking diabetes medicines or blood thinners. ? Taking medicines such as aspirin and ibuprofen. These medicines can thin your blood. Do not take these medicines before your procedure if your health care provider instructs you not to.  Follow your health care provider's instructions about eating or drinking restrictions.  Plan to have someone take you home after the procedure.  If you go home right after the procedure, plan to have someone with you for 24 hours.  Do not drink alcohol unless  your health care provider says that you can.  Do not use any tobacco products, including cigarettes, chewing tobacco, or electronic cigarettes unless your health care provider says that you can. If you need help quitting, ask your health care provider.  You may have a physical exam. What happens during the procedure?  An IV tube will be inserted into one of your veins.  You will be given one or more of the following: ? A medicine that helps you relax (sedative). ? A medicine that numbs the area (local anesthetic). ? A medicine that makes you fall asleep (general anesthetic). ? A medicine that is injected into your spine that numbs the area below and slightly above the injection site (spinal anesthetic). ? A medicine that is injected into an area of your body that numbs everything below the injection site (regional anesthetic).  A cuff may be placed around your upper leg to slow bleeding during the procedure.  The surgeon will make a small number of incisions around your knee.  Your knee joint will be flushed and filled with a germ-free (sterile) solution.  The arthroscope will be passed through an incision into your knee joint.  More instruments will be passed through other incisions to repair your knee as needed.  The fluid will be removed from your knee.  The incisions will be closed with adhesive strips or stitches (sutures).  A bandage (dressing) will be placed over your knee. The procedure may vary among health care providers and hospitals. What happens after the procedure?  Your blood pressure, heart rate, breathing  rate and blood oxygen level will be monitored often until the medicines you were given have worn off.  You may be given medicine for pain.  You may get crutches to help you walk without using your knee to support your body weight.  You may have to wear compression stockings. These stocking help to prevent blood clots and reduce swelling in your legs. This  information is not intended to replace advice given to you by your health care provider. Make sure you discuss any questions you have with your health care provider. Document Released: 08/09/2000 Document Revised: 01/18/2016 Document Reviewed: 08/08/2014 Elsevier Interactive Patient Education  2018 [place] [employer] Calories are units of energy. Your body needs a certain amount of calories from food to keep you going throughout the day. When you eat more calories than your body needs, your body stores the extra calories as fat. When you eat fewer calories than your body needs, your body burns fat to get the energy it needs. Calorie counting means keeping track of how many calories you eat and drink each day. Calorie counting can be helpful if you need to lose weight. If you make sure to eat fewer calories than your body needs, you should lose weight. Ask your health care provider what a healthy weight is for you. For calorie counting to work, you will need to eat the right number of calories in a day in order to lose a healthy amount of weight per week. A dietitian can help you determine how many calories you need in a day and will give you suggestions on how to reach your calorie goal.  A healthy amount of weight to lose per week is usually 1-2 lb (0.5-0.9 kg). This usually means that your daily calorie intake should be reduced by 500-750 calories.  Eating 1,200 - 1,500 calories per day can help most women lose weight.  Eating 1,500 - 1,800 calories per day can help most men lose weight.  What is my plan? My goal is to have __________ calories per day. If I have this many calories per day, I should lose around __________ pounds per week. What do I need to know about calorie counting? In order to meet your daily calorie goal, you will need to:  Find out how many calories are in each food you would like to eat. Try to do this before you eat.  Decide how much of  the food you plan to eat.  Write down what you ate and how many calories it had. Doing this is called keeping a food log.  To successfully lose weight, it is important to balance calorie counting with a healthy lifestyle that includes regular activity. Aim for 150 minutes of moderate exercise (such as walking) or 75 minutes of vigorous exercise (such as running) each week. Where do I find calorie information?  The number of calories in a food can be found on a Nutrition Facts label. If a food does not have a Nutrition Facts label, try to look up the calories online or ask your dietitian for help. Remember that calories are listed per serving. If you choose to have more than one serving of a food, you will have to multiply the calories per serving by the amount of servings you plan to eat. For example, the label on a package of bread might say that a serving size is 1 slice and that there are 90 calories in a serving. If you  eat 1 slice, you will have eaten 90 calories. If you eat 2 slices, you will have eaten 180 calories. How do I keep a food log? Immediately after each meal, record the following information in your food log:  What you ate. Don't forget to include toppings, sauces, and other extras on the food.  How much you ate. This can be measured in cups, ounces, or number of items.  How many calories each food and drink had.  The total number of calories in the meal.  Keep your food log near you, such as in a small notebook in your pocket, or use a mobile app or website. Some programs will calculate calories for you and show you how many calories you have left for the day to meet your goal. What are some calorie counting tips?  Use your calories on foods and drinks that will fill you up and not leave you hungry: ? Some examples of foods that fill you up are nuts and nut butters, vegetables, lean proteins, and high-fiber foods like whole grains. High-fiber foods are foods with more than  5 g fiber per serving. ? Drinks such as sodas, specialty coffee drinks, alcohol, and juices have a lot of calories, yet do not fill you up.  Eat nutritious foods and avoid empty calories. Empty calories are calories you get from foods or beverages that do not have many vitamins or protein, such as candy, sweets, and soda. It is better to have a nutritious high-calorie food (such as an avocado) than a food with few nutrients (such as a bag of chips).  Know how many calories are in the foods you eat most often. This will help you calculate calorie counts faster.  Pay attention to calories in drinks. Low-calorie drinks include water and unsweetened drinks.  Pay attention to nutrition labels for "low fat" or "fat free" foods. These foods sometimes have the same amount of calories or more calories than the full fat versions. They also often have added sugar, starch, or salt, to make up for flavor that was removed with the fat.  Find a way of tracking calories that works for you. Get creative. Try different apps or programs if writing down calories does not work for you. What are some portion control tips?  Know how many calories are in a serving. This will help you know how many servings of a certain food you can have.  Use a measuring cup to measure serving sizes. You could also try weighing out portions on a kitchen scale. With time, you will be able to estimate serving sizes for some foods.  Take some time to put servings of different foods on your favorite plates, bowls, and cups so you know what a serving looks like.  Try not to eat straight from a bag or box. Doing this can lead to overeating. Put the amount you would like to eat in a cup or on a plate to make sure you are eating the right portion.  Use smaller plates, glasses, and bowls to prevent overeating.  Try not to multitask (for example, watch TV or use your computer) while eating. If it is time to eat, sit down at a table and enjoy  your food. This will help you to know when you are full. It will also help you to be aware of what you are eating and how much you are eating. What are tips for following this plan? Reading food labels  Check the calorie count  compared to the serving size. The serving size may be smaller than what you are used to eating.  Check the source of the calories. Make sure the food you are eating is high in vitamins and protein and low in saturated and trans fats. Shopping  Read nutrition labels while you shop. This will help you make healthy decisions before you decide to purchase your food.  Make a grocery list and stick to it. Cooking  Try to cook your favorite foods in a healthier way. For example, try baking instead of frying.  Use low-fat dairy products. Meal planning  Use more fruits and vegetables. Half of your plate should be fruits and vegetables.  Include lean proteins like poultry and fish. How do I count calories when eating out?  Ask for smaller portion sizes.  Consider sharing an entree and sides instead of getting your own entree.  If you get your own entree, eat only half. Ask for a box at the beginning of your meal and put the rest of your entree in it so you are not tempted to eat it.  If calories are listed on the menu, choose the lower calorie options.  Choose dishes that include vegetables, fruits, whole grains, low-fat dairy products, and lean protein.  Choose items that are boiled, broiled, grilled, or steamed. Stay away from items that are buttered, battered, fried, or served with cream sauce. Items labeled "crispy" are usually fried, unless stated otherwise.  Choose water, low-fat milk, unsweetened iced tea, or other drinks without added sugar. If you want an alcoholic beverage, choose a lower calorie option such as a glass of wine or light beer.  Ask for dressings, sauces, and syrups on the side. These are usually high in calories, so you should limit the  amount you eat.  If you want a salad, choose a garden salad and ask for grilled meats. Avoid extra toppings like bacon, cheese, or fried items. Ask for the dressing on the side, or ask for olive oil and vinegar or lemon to use as dressing.  Estimate how many servings of a food you are given. For example, a serving of cooked rice is  cup or about the size of half a baseball. Knowing serving sizes will help you be aware of how much food you are eating at restaurants. The list below tells you how big or small some common portion sizes are based on everyday objects: ? 1 oz-4 stacked dice. ? 3 oz-1 deck of cards. ? 1 tsp-1 die. ? 1 Tbsp- a ping-pong ball. ? 2 Tbsp-1 ping-pong ball. ?  cup- baseball. ? 1 cup-1 baseball. Summary  Calorie counting means keeping track of how many calories you eat and drink each day. If you eat fewer calories than your body needs, you should lose weight.  A healthy amount of weight to lose per week is usually 1-2 lb (0.5-0.9 kg). This usually means reducing your daily calorie intake by 500-750 calories.  The number of calories in a food can be found on a Nutrition Facts label. If a food does not have a Nutrition Facts label, try to look up the calories online or ask your dietitian for help.  Use your calories on foods and drinks that will fill you up, and not on foods and drinks that will leave you hungry.  Use smaller plates, glasses, and bowls to prevent overeating. This information is not intended to replace advice given to you by your health care provider. Make sure you  discuss any questions you have with your health care provider. Document Released: 08/12/2005 Document Revised: 07/12/2016 Document Reviewed: 07/12/2016 Else[employer] Interactive Patient Education  [employer].

## 2018-05-07 NOTE — Patient Instructions (Signed)
Samantha Larsen  05/07/2018     @PREFPERIOPPHARMACY @   Your procedure is scheduled on  05/14/2018  Report to [facility] at  1045   A.M.  Call this number if you have problems the morning of surgery:  [phone]   Remember:  Do not eat or drink after midnight.  You may drink clear liquids until 12 midnight 05/13/2018 .  Clear liquids allowed are:                    Water, Juice (non-citric and without pulp), Carbonated beverages, Clear Tea, Black Coffee only, Plain Jell-O only, Gatorade and Plain Popsicles only    Take these medicines the morning of surgery with A SIP OF WATER prevacid, nadolol, tramadol. Use your inhaler before you come.    Do not wear jewelry, make-up or nail polish.  Do not wear lotions, powders, or perfumes, or deodorant.  Do not shave 48 hours prior to surgery.  Men may shave face and neck.  Do not bring valuables to the hospital.  [facility] is not responsible for any belongings or valuables.  Contacts, dentures or bridgework may not be worn into surgery.  Leave your suitcase in the car.  After surgery it may be brought to your room.  For patients admitted to the hospital, discharge time will be determined by your treatment team.  Patients discharged the day of surgery will not be allowed to drive home.   Name and phone number of your driver:   family Special instructions:  None  Please read over the following fact sheets that you were given. Anesthesia Post-op Instructions and Care and Recovery After Surgery      Knee Ligament Injury, Arthroscopy Arthroscopy is a surgical technique in which your health care provider examines your knee through a small, pencil-sized telescope (arthroscope). Often, repairs to injured ligaments can be done with instruments in the arthroscope. Arthroscopy is less invasive than open-knee surgery. Tell a health care provider about:  Any allergies you have.  All medicines you are taking, including vitamins,  herbs, eye drops, creams, and over-the-counter medicines.  Any problems you or family members have had with anesthetic medicines.  Any blood disorders you have.  Any surgeries you have had.  Any medical conditions you have. What are the risks? Generally, this is a safe procedure. However, as with any procedure, problems can occur. Possible problems include:  Infection.  Bleeding.  Stiffness.  What happens before the procedure?  Ask your health care provider about changing or stopping any regular medicines. Avoid taking aspirin or blood thinners as directed by your health care provider.  Do not eat or drink anything after midnight the night before surgery.  If you smoke, do not smoke for at least 2 weeks before your surgery.  Do not drink alcohol starting the day before your surgery.  Let your health care provider know if you develop a cold or any infection before your surgery.  Arrange for someone to drive you home after the surgery or after your hospital stay. Also arrange for someone to help you with activities during recovery. What happens during the procedure?  Small monitors will be put on your body. They are used to check your heart, blood pressure, and oxygen levels.  An IV access tube will be put into one of your veins. Medicine will be able to flow directly into your body through this IV tube.  You  might be given a medicine to help you relax (sedative).  You will be given a medicine that makes you go to sleep (general anesthetic), and a breathing tube will be placed into your lungs during the procedure.  Several small incisions are made in your knee. Saline fluid is placed into one of the incisions to expand the knee and clear away any blood in the knee.  Your health care provider will insert the arthroscope to examine the injured knee.  During arthroscopy, your health care provider may find a partial or complete tear in a ligament.  Tools can be inserted  through the other incisions to repair the injured ligaments.  The incisions are then closed with absorbable stitches and covered with dressings. What happens after the procedure?  You will be taken to the recovery area where you will be monitored.  When you are awake, stable, and taking fluids without problems, you will be allowed to go home. This information is not intended to replace advice given to you by your health care provider. Make sure you discuss any questions you have with your health care provider. Document Released: 08/09/2000 Document Revised: 01/18/2016 Document Reviewed: 03/24/2013 Elsevier Interactive Patient Education  2017 [employer].  Arthroscopic Knee Ligament Repair, Care After This sheet gives you information about how to care for yourself after your procedure. Your health care provider may also give you more specific instructions. If you have problems or questions, contact your health care provider. What can I expect after the procedure? After the procedure, it is common to have:  Pain in your knee.  Bruising and swelling on your knee, calf, and ankle for 3-4 days.  Fatigue.  Follow these instructions at home: If you have a brace or immobilizer:  Wear the brace or immobilizer as told by your health care provider. Remove it only as told by your health care provider.  Loosen the splint or immobilizer if your toes tingle, become numb, or turn cold and blue.  Keep the brace or immobilizer clean. Bathing  Do not take baths, swim, or use a hot tub until your health care provider approves. Ask your health care provider if you can take showers.  Keep your bandage (dressing) dry until your health care provider says that it can be removed. Cover it and your brace or immobilizer with a watertight covering when you take a shower. Incision care  Follow instructions from your health care provider about how to take care of your incision. Make sure you: ? Wash your  hands with soap and water before you change your bandage (dressing). If soap and water are not available, use hand sanitizer. ? Change your dressing as told by your health care provider. ? Leave stitches (sutures), skin glue, or adhesive strips in place. These skin closures may need to stay in place for 2 weeks or longer. If adhesive strip edges start to loosen and curl up, you may trim the loose edges. Do not remove adhesive strips completely unless your health care provider tells you to do that.  Check your incision area every day for signs of infection. Check for: ? More redness, swelling, or pain. ? More fluid or blood. ? Warmth. ? Pus or a bad smell. Managing pain, stiffness, and swelling  If directed, put ice on the affected area. ? If you have a removable brace or immobilizer, remove it as told by your health care provider. ? Put ice in a plastic bag. ? Place a towel between your  skin and the bag or between your brace or immobilizer and the bag. ? Leave the ice on for 20 minutes, 2-3 times a day.  Move your toes often to avoid stiffness and to lessen swelling.  Raise (elevate) the injured area above the level of your heart while you are sitting or lying down. Driving  Do not drive until your health care provider approves. If you have a brace or immobilizer on your leg, ask your health care provider when it is safe for you to drive.  Do not drive or use heavy machinery while taking prescription pain medicine. Activity  Rest as directed. Ask your health care provider what activities are safe for you.  Do physical therapy exercises as told by your health care provider. Physical therapy will help you regain strength and motion in your knee.  Follow instructions from your health care provider about: ? When you may start motion exercises. ? When you may start riding a stationary bike and doing other low-impact activities. ? When you may start to jog and do other high-impact  activities. Safety  Do not use the injured limb to support your body weight until your health care provider says that you can. Use crutches as told by your health care provider. General instructions  Do not use any products that contain nicotine or tobacco, such as cigarettes and e-cigarettes. These can delay bone healing. If you need help quitting, ask your health care provider.  To prevent or treat constipation while you are taking prescription pain medicine, your health care provider may recommend that you: ? Drink enough fluid to keep your urine clear or pale yellow. ? Take over-the-counter or prescription medicines. ? Eat foods that are high in fiber, such as fresh fruits and vegetables, whole grains, and beans. ? Limit foods that are high in fat and processed sugars, such as fried and sweet foods.  Take over-the-counter and prescription medicines only as told by your health care provider.  Keep all follow-up visits as told by your health care provider. This is important. Contact a health care provider if:  You have more redness, swelling, or pain around an incision.  You have more fluid or blood coming from an incision.  Your incision feels warm to the touch.  You have a fever.  You have pain or swelling in your knee, and it gets worse.  You have pain that does not get better with medicine. Get help right away if:  You have trouble breathing.  You have pus or a bad smell coming from an incision.  You have numbness and tingling near the knee joint. Summary  After the procedure, it is common to have knee pain with bruising and swelling on your knee, calf, and ankle.  Icing your knee and raising your leg above the level of your heart will help control the pain and the swelling.  Do physical therapy exercises as told by your health care provider. Physical therapy will help you regain strength and motion in your knee. This information is not intended to replace advice  given to you by your health care provider. Make sure you discuss any questions you have with your health care provider. Document Released: 06/02/2013 Document Revised: 08/06/2016 Document Reviewed: 08/06/2016 Elsevier Interactive Patient Education  2017 [place] Anesthesia, Adult General anesthesia is the use of medicines to make a person "go to sleep" (be unconscious) for a medical procedure. General anesthesia is often recommended when a procedure:  Is long.  Requires you to be still or in an unusual position.  Is major and can cause you to lose blood.  Is impossible to do without general anesthesia.  The medicines used for general anesthesia are called general anesthetics. In addition to making you sleep, the medicines:  Prevent pain.  Control your blood pressure.  Relax your muscles.  Tell a health care provider about:  Any allergies you have.  All medicines you are taking, including vitamins, herbs, eye drops, creams, and over-the-counter medicines.  Any problems you or family members have had with anesthetic medicines.  Types of anesthetics you have had in the past.  Any bleeding disorders you have.  Any surgeries you have had.  Any medical conditions you have.  Any history of heart or lung conditions, such as heart failure, sleep apnea, or chronic obstructive pulmonary disease (COPD).  Whether you are pregnant or may be pregnant.  Whether you use tobacco, alcohol, marijuana, or street drugs.  Any history of Armed forces logistics/support/administrative officer.  Any history of depression or anxiety. What are the risks? Generally, this is a safe procedure. However, problems may occur, including:  Allergic reaction to anesthetics.  Lung and heart problems.  Inhaling food or liquids from your stomach into your lungs (aspiration).  Injury to nerves.  Waking up during your procedure and being unable to move (rare).  Extreme agitation or a state of mental confusion (delirium)  when you wake up from the anesthetic.  Air in the bloodstream, which can lead to stroke.  These problems are more likely to develop if you are having a major surgery or if you have an advanced medical condition. You can prevent some of these complications by answering all of your health care provider's questions thoroughly and by following all pre-procedure instructions. General anesthesia can cause side effects, including:  Nausea or vomiting  A sore throat from the breathing tube.  Feeling cold or shivery.  Feeling tired, washed out, or achy.  Sleepiness or drowsiness.  Confusion or agitation.  What happens before the procedure? Staying hydrated Follow instructions from your health care provider about hydration, which may include:  Up to 2 hours before the procedure - you may continue to drink clear liquids, such as water, clear fruit juice, black coffee, and plain tea.  Eating and drinking restrictions Follow instructions from your health care provider about eating and drinking, which may include:  8 hours before the procedure - stop eating heavy meals or foods such as meat, fried foods, or fatty foods.  6 hours before the procedure - stop eating light meals or foods, such as toast or cereal.  6 hours before the procedure - stop drinking milk or drinks that contain milk.  2 hours before the procedure - stop drinking clear liquids.  Medicines  Ask your health care provider about: ? Changing or stopping your regular medicines. This is especially important if you are taking diabetes medicines or blood thinners. ? Taking medicines such as aspirin and ibuprofen. These medicines can thin your blood. Do not take these medicines before your procedure if your health care provider instructs you not to. ? Taking new dietary supplements or medicines. Do not take these during the week before your procedure unless your health care provider approves them.  If you are told to take a  medicine or to continue taking a medicine on the day of the procedure, take the medicine with sips of water. General instructions   Ask if you will be going home the  same day, the following day, or after a longer hospital stay. ? Plan to have someone take you home. ? Plan to have someone stay with you for the first 24 hours after you leave the hospital or clinic.  For 3-6 weeks before the procedure, try not to use any tobacco products, such as cigarettes, chewing tobacco, and e-cigarettes.  You may brush your teeth on the morning of the procedure, but make sure to spit out the toothpaste. What happens during the procedure?  You will be given anesthetics through a mask and through an IV tube in one of your veins.  You may receive medicine to help you relax (sedative).  As soon as you are asleep, a breathing tube may be used to help you breathe.  An anesthesia specialist will stay with you throughout the procedure. He or she will help keep you comfortable and safe by continuing to give you medicines and adjusting the amount of medicine that you get. He or she will also watch your blood pressure, pulse, and oxygen levels to make sure that the anesthetics do not cause any problems.  If a breathing tube was used to help you breathe, it will be removed before you wake up. The procedure may vary among health care providers and hospitals. What happens after the procedure?  You will wake up, often slowly, after the procedure is complete, usually in a recovery area.  Your blood pressure, heart rate, breathing rate, and blood oxygen level will be monitored until the medicines you were given have worn off.  You may be given medicine to help you calm down if you feel anxious or agitated.  If you will be going home the same day, your health care provider may check to make sure you can stand, drink, and urinate.  Your health care providers will treat your pain and side effects before you go  home.  Do not drive for 24 hours if you received a sedative.  You may: ? Feel nauseous and vomit. ? Have a sore throat. ? Have mental slowness. ? Feel cold or shivery. ? Feel sleepy. ? Feel tired. ? Feel sore or achy, even in parts of your body where you did not have surgery. This information is not intended to replace advice given to you by your health care provider. Make sure you discuss any questions you have with your health care provider. Document Released: 11/19/2007 Document Revised: 01/23/2016 Document Reviewed: 07/27/2015 Elsevier Interactive Patient Education  2018 [place] Anesthesia, Adult, Care After These instructions provide you with information about caring for yourself after your procedure. Your health care provider may also give you more specific instructions. Your treatment has been planned according to current medical practices, but problems sometimes occur. Call your health care provider if you have any problems or questions after your procedure. What can I expect after the procedure? After the procedure, it is common to have:  Vomiting.  A sore throat.  Mental slowness.  It is common to feel:  Nauseous.  Cold or shivery.  Sleepy.  Tired.  Sore or achy, even in parts of your body where you did not have surgery.  Follow these instructions at home: For at least 24 hours after the procedure:  Do not: ? Participate in activities where you could fall or become injured. ? Drive. ? Use heavy machinery. ? Drink alcohol. ? Take sleeping pills or medicines that cause drowsiness. ? Make important decisions or sign legal documents. ? Take care of  children on your own.  Rest. Eating and drinking  If you vomit, drink water, juice, or soup when you can drink without vomiting.  Drink enough fluid to keep your urine clear or pale yellow.  Make sure you have little or no nausea before eating solid foods.  Follow the diet recommended by your  health care provider. General instructions  Have a responsible adult stay with you until you are awake and alert.  Return to your normal activities as told by your health care provider. Ask your health care provider what activities are safe for you.  Take over-the-counter and prescription medicines only as told by your health care provider.  If you smoke, do not smoke without supervision.  Keep all follow-up visits as told by your health care provider. This is important. Contact a health care provider if:  You continue to have nausea or vomiting at home, and medicines are not helpful.  You cannot drink fluids or start eating again.  You cannot urinate after 8-12 hours.  You develop a skin rash.  You have fever.  You have increasing redness at the site of your procedure. Get help right away if:  You have difficulty breathing.  You have chest pain.  You have unexpected bleeding.  You feel that you are having a life-threatening or urgent problem. This information is not intended to replace advice given to you by your health care provider. Make sure you discuss any questions you have with your health care provider. Document Released: 11/18/2000 Document Revised: 01/15/2016 Document Reviewed: 07/27/2015 Elsevier Interactive Patient Education  [employer].

## 2018-05-08 ENCOUNTER — Other Ambulatory Visit: Payer: Self-pay

## 2018-05-08 ENCOUNTER — Encounter (HOSPITAL_COMMUNITY)
Admission: RE | Admit: 2018-05-08 | Discharge: 2018-05-08 | Disposition: A | Discharge status: Home / Self Care | Payer: BC Managed Care – PPO | Source: Ambulatory Visit | Attending: Orthopedic Surgery | Admitting: Orthopedic Surgery

## 2018-05-08 ENCOUNTER — Encounter (HOSPITAL_COMMUNITY): Payer: Self-pay

## 2018-05-08 DIAGNOSIS — M1711 Unilateral primary osteoarthritis, right knee: Secondary | ICD-10-CM | POA: Diagnosis not present

## 2018-05-08 DIAGNOSIS — Z01818 Encounter for other preprocedural examination: Secondary | ICD-10-CM | POA: Insufficient documentation

## 2018-05-08 HISTORY — DX: Gastro-esophageal reflux disease without esophagitis: K21.9

## 2018-05-08 HISTORY — DX: Unspecified osteoarthritis, unspecified site: M19.90

## 2018-05-08 HISTORY — DX: Pure hypercholesterolemia, unspecified: E78.00

## 2018-05-08 HISTORY — DX: Essential (primary) hypertension: I10

## 2018-05-08 HISTORY — DX: Unspecified asthma, uncomplicated: J45.909

## 2018-05-08 LAB — BASIC METABOLIC PANEL
ANION GAP: 9 (ref 5–15)
BUN: 13 mg/dL (ref 8–23)
CALCIUM: 9.1 mg/dL (ref 8.9–10.3)
CO2: 27 mmol/L (ref 22–32)
CREATININE: 0.78 mg/dL (ref 0.44–1.00)
Chloride: 104 mmol/L (ref 98–111)
GFR calc Af Amer: >60 mL/min (ref >60)
GFR calc non Af Amer: >60 mL/min (ref >60)
GLUCOSE: 103 mg/dL — AB (ref 70–99)
Potassium: 3.4 mmol/L — ABNORMAL LOW (ref 3.5–5.1)
Sodium: 140 mmol/L (ref 135–145)

## 2018-05-08 LAB — CBC WITH DIFFERENTIAL/PLATELET
BASOS PCT: 0 %
Basophils Absolute: 0 10*3/uL (ref 0.0–0.1)
Eosinophils Absolute: 0.1 10*3/uL (ref 0.0–0.7)
Eosinophils Relative: 2 %
HCT: 42.3 % (ref 36.0–46.0)
Hemoglobin: 14 g/dL (ref 12.0–15.0)
LYMPHS ABS: 2.5 10*3/uL (ref 0.7–4.0)
Lymphocytes Relative: 30 %
MCH: 32.2 pg (ref 26.0–34.0)
MCHC: 33.1 g/dL (ref 30.0–36.0)
MCV: 97.2 fL (ref 78.0–100.0)
MONO ABS: 0.5 10*3/uL (ref 0.1–1.0)
MONOS PCT: 6 %
NEUTROS ABS: 5.1 10*3/uL (ref 1.7–7.7)
Neutrophils Relative %: 62 %
Platelets: 340 10*3/uL (ref 150–400)
RBC: 4.35 MIL/uL (ref 3.87–5.11)
RDW: 13.7 % (ref 11.5–15.5)
WBC: 8.2 10*3/uL (ref 4.0–10.5)

## 2018-05-13 NOTE — H&P (Signed)
PREOP CONSULT/REFERRAL INTRA-OFFICE FROM DR Tyrone Apple         Chief Complaint  Patient presents with  . Knee Pain      right   . Results      review MRI       62 years old presents for follow-up with ongoing medial sided right knee pain.  Patient is NSAID allergic so we tried tramadol for pain.  She did not improve   Her MRI results are back   Initially sent to [place] for consultation by Dr. Sinda Du for evaluation of right knee pain      62 year old female with morbid obesity and NSAID allergy.  Presents with several month (2 months), history of right medial knee pain.  She had a episode where she stepped off height her knee buckled she felt acute pain initially got better with some Tylenol then a second insult to the knee where she hyperextended it and the pain came back it was a wart worse it was nonradiating is confined to the medial compartment its dull its associated with inability to extend the knee and medial swelling.       Review of Systems  All other systems reviewed and are negative.       History reviewed. No pertinent past medical history.  Hypertension diabetes heart disease negative        Past Surgical History:  Procedure Laterality Date  . ABDOMINAL HYSTERECTOMY      . COLONOSCOPY N/A 05/18/2014    Procedure: COLONOSCOPY;  Surgeon: Rogene Houston, MD;  Location: [facility];  Service: Endoscopy;  Laterality: N/A;  1200  . DILATION AND CURETTAGE OF UTERUS        x 2  . lasik      . TUBAL LIGATION               Family History  Problem Relation Age of Onset  . Other Mother    . Dementia Father    . Heart murmur Father    . High blood pressure Father    . Arthritis Father    . High blood pressure Sister    . Arthritis Sister      Social History        Tobacco Use  . Smoking status: Never Smoker  . Smokeless tobacco: Never Used  Substance Use Topics  . Alcohol use: No  . Drug use: No           Allergies  Allergen Reactions  .  Ibuprofen Swelling      Eyes swell   . Penicillins Hives            Current Meds  Medication Sig  . albuterol (PROVENTIL HFA;VENTOLIN HFA) 108 (90 BASE) MCG/ACT inhaler Inhale 1-2 puffs into the lungs every 6 (six) hours as needed for wheezing or shortness of breath.  . fluticasone (FLONASE) 50 MCG/ACT nasal spray Place 2 sprays into both nostrils daily.  . folic acid (FOLVITE) 1 MG tablet Take 1 mg by mouth daily.  . hydrochlorothiazide (HYDRODIURIL) 12.5 MG tablet    . lansoprazole (PREVACID) 30 MG capsule Take 30 mg by mouth daily at 12 noon.  . Multiple Vitamins-Minerals (MULTIVITAMINS THER. W/MINERALS) TABS tablet Take 1 tablet by mouth daily.  . nadolol (CORGARD) 40 MG tablet    . nadolol-bendroflumethiazide (CORZIDE) 40-5 MG per tablet Take 1 tablet by mouth daily.  Marland Kitchen nystatin-triamcinolone (MYCOLOG II) cream Apply 1 application topically 2 (two) times daily.  Marland Kitchen  Omega-3 Fatty Acids (FISH OIL PO) Take 1 capsule by mouth daily.  Marland Kitchen oxymetazoline (AFRIN) 0.05 % nasal spray Place 1 spray into both nostrils 2 (two) times daily as needed for congestion.  . simvastatin (ZOCOR) 40 MG tablet Take 40 mg by mouth daily.  . traMADol-acetaminophen (ULTRACET) 37.5-325 MG tablet Take 1 tablet by mouth every 4 (four) hours as needed.  . vitamin B-12 (CYANOCOBALAMIN) 1000 MCG tablet Take 1,000 mcg by mouth daily.      BP (!) 175/78   Pulse 60   Ht 4\' 11"  (1.499 m)   Wt 227 lb (103 kg)   BMI 45.85 kg/m    Physical Exam  Constitutional: She is oriented to person, place, and time. She appears well-developed and well-nourished.  Endomorphic  Neurological: She is alert and oriented to person, place, and time.  Psychiatric: She has a normal mood and affect. Judgment normal.  Vitals reviewed.     Ortho Exam    Left knee inspection palpation no significant tenderness or swelling full range of motion ligaments stable strength normal pulses good skin intact lymph nodes negative sensation normal  balance good   Right knee medial joint line tenderness mild swelling full range of motion ligaments stable McMurray's positive strength normal skin excellent pulse good sensation normal no pathologic reflexes   MEDICAL DECISION SECTION  MRI scan was reviewed independent of the report and as the report states I agree that she has cartilage loss patellofemoral joint medial femoral condyle and a torn medial meniscus       Encounter Diagnoses  Name Primary?  . Body mass index 45.0-49.9, adult ([place])    . Morbid obesity ([place])    . Primary osteoarthritis of right knee Yes  . Derangement of posterior horn of medial meniscus of right knee    The patient meets the AMA guidelines for Morbid (severe) obesity with a BMI > 40.0 and I have recommended weight loss.     Results were relayed to the patient options for surgical intervention were entertained and accepted.  The patient no longer wishes to live with the pain or try other nonoperative treatments She understands that she has arthritis she has some bone edema she will continue to have arthritic symptoms after surgery she understands this   PLAN:    FINDINGS: Despite efforts by the technologist and patient, motion artifact is present on today's exam and could not be eliminated. This reduces exam sensitivity and specificity.   MENISCI   Medial meniscus:  Oblique tear of the posterior horn.   Lateral meniscus:  Intact.   LIGAMENTS   Cruciates:  Intact ACL and PCL.   Collaterals: Medial collateral ligament is intact. Lateral collateral ligament complex is intact.   CARTILAGE   Patellofemoral: High-grade partial-thickness cartilage loss over the majority of the patella.   Medial:  Mild partial-thickness cartilage loss without focal defect.   Lateral:  Unremarkable.   Joint: Small joint effusion. Normal Hoffa's fat. No plical thickening.   Popliteal Fossa:  Tiny Baker cyst.  Intact popliteus tendon.   Extensor Mechanism:  Intact quadriceps tendon and patellar tendon. Intact medial and lateral patellar retinaculum. Intact MPFL.   Bones: Trace subchondral marrow edema in the peripheral medial compartment. No acute fracture or dislocation. No focal bone lesion.   Other: None.   IMPRESSION: 1. Oblique tear of the medial meniscus posterior horn. 2. Mild medial and patellofemoral compartment osteoarthritis.     Electronically Signed   By: Orville Govern.D.  On: 04/26/2018 10:31 CLINICAL DATA:  Twisted knee twice in the last 2 months, persistent pain   EXAM: RIGHT KNEE - COMPLETE 4+ VIEW   COMPARISON:  None.   FINDINGS: The right knee joint spaces are relatively normal with perhaps minimal loss of medial compartment space. The lateral and patellofemoral compartments are well preserved. No fracture is seen. No joint effusion is noted.   IMPRESSION: Very minimal degenerative change with slight loss of medial compartment joint space.     Electronically Signed   By: Ivar Drape M.D.   On: 12/09/2017 13:39     Surgical procedure planned: Arthroscopy right knee partial medial meniscectomy       The procedure has been fully reviewed with the patient; The risks and benefits of surgery have been discussed and explained and understood. Alternative treatment has also been reviewed, questions were encouraged and answered. The postoperative plan is also been reviewed.   Nonsurgical treatment as described in the history and physical section was attempted and unsuccessful and the patient has agreed to proceed with surgical intervention to improve their situation.   No orders of the defined types were placed in this encounter.     Arther Abbott, MD

## 2018-05-14 ENCOUNTER — Encounter (HOSPITAL_COMMUNITY): Payer: Self-pay | Admitting: Anesthesiology

## 2018-05-14 ENCOUNTER — Ambulatory Visit (HOSPITAL_COMMUNITY): Payer: BC Managed Care – PPO | Admitting: Anesthesiology

## 2018-05-14 ENCOUNTER — Ambulatory Visit (HOSPITAL_COMMUNITY)
Admission: RE | Admit: 2018-05-14 | Discharge: 2018-05-14 | Disposition: A | Discharge status: Home / Self Care | Payer: BC Managed Care – PPO | Source: Ambulatory Visit | Attending: Orthopedic Surgery | Admitting: Orthopedic Surgery

## 2018-05-14 ENCOUNTER — Encounter (HOSPITAL_COMMUNITY)
Admission: RE | Disposition: A | Discharge status: Home / Self Care | Payer: Self-pay | Source: Ambulatory Visit | Attending: Orthopedic Surgery

## 2018-05-14 DIAGNOSIS — Z79899 Other long term (current) drug therapy: Secondary | ICD-10-CM | POA: Diagnosis not present

## 2018-05-14 DIAGNOSIS — X58XXXA Exposure to other specified factors, initial encounter: Secondary | ICD-10-CM | POA: Diagnosis not present

## 2018-05-14 DIAGNOSIS — M94261 Chondromalacia, right knee: Secondary | ICD-10-CM | POA: Insufficient documentation

## 2018-05-14 DIAGNOSIS — M23300 Other meniscus derangements, unspecified lateral meniscus, right knee: Secondary | ICD-10-CM

## 2018-05-14 DIAGNOSIS — Z7951 Long term (current) use of inhaled steroids: Secondary | ICD-10-CM | POA: Insufficient documentation

## 2018-05-14 DIAGNOSIS — J45909 Unspecified asthma, uncomplicated: Secondary | ICD-10-CM | POA: Diagnosis not present

## 2018-05-14 DIAGNOSIS — Z6841 Body Mass Index (BMI) 40.0 and over, adult: Secondary | ICD-10-CM | POA: Insufficient documentation

## 2018-05-14 DIAGNOSIS — S83281A Other tear of lateral meniscus, current injury, right knee, initial encounter: Secondary | ICD-10-CM | POA: Insufficient documentation

## 2018-05-14 DIAGNOSIS — I10 Essential (primary) hypertension: Secondary | ICD-10-CM | POA: Insufficient documentation

## 2018-05-14 DIAGNOSIS — Z9889 Other specified postprocedural states: Secondary | ICD-10-CM

## 2018-05-14 DIAGNOSIS — M23321 Other meniscus derangements, posterior horn of medial meniscus, right knee: Secondary | ICD-10-CM | POA: Diagnosis not present

## 2018-05-14 DIAGNOSIS — S83241A Other tear of medial meniscus, current injury, right knee, initial encounter: Secondary | ICD-10-CM | POA: Insufficient documentation

## 2018-05-14 DIAGNOSIS — M25561 Pain in right knee: Secondary | ICD-10-CM | POA: Diagnosis present

## 2018-05-14 DIAGNOSIS — Z886 Allergy status to analgesic agent status: Secondary | ICD-10-CM | POA: Diagnosis not present

## 2018-05-14 HISTORY — PX: KNEE ARTHROSCOPY WITH MEDIAL MENISECTOMY: SHX5651

## 2018-05-14 SURGERY — ARTHROSCOPY, KNEE, WITH MEDIAL MENISCECTOMY
Anesthesia: Monitor Anesthesia Care | Site: Knee | Laterality: Right

## 2018-05-14 MED ORDER — LACTATED RINGERS IV SOLN
INTRAVENOUS | Status: DC | PRN
  Administered 2018-05-14: 11:00:00 via INTRAVENOUS

## 2018-05-14 MED ORDER — VANCOMYCIN HCL 10 G IV SOLR
1500.0000 mg | INTRAVENOUS | Status: AC
  Administered 2018-05-14: 1500 mg via INTRAVENOUS

## 2018-05-14 MED ORDER — CHLORHEXIDINE GLUCONATE 4 % EX LIQD: 60.0000 mL | Freq: Once | CUTANEOUS | Status: DC

## 2018-05-14 MED ORDER — EPINEPHRINE PF 1 MG/ML IJ SOLN
INTRAMUSCULAR | Status: AC
  Filled 2018-05-14: qty 4

## 2018-05-14 MED ORDER — LACTATED RINGERS IV SOLN: INTRAVENOUS | Status: DC

## 2018-05-14 MED ORDER — BUPIVACAINE-EPINEPHRINE (PF) 0.5% -1:200000 IJ SOLN
INTRAMUSCULAR | Status: DC | PRN
  Administered 2018-05-14 (×2): 30 mL

## 2018-05-14 MED ORDER — MIDAZOLAM HCL 5 MG/5ML IJ SOLN
INTRAMUSCULAR | Status: DC | PRN
  Administered 2018-05-14: 2 mg via INTRAVENOUS

## 2018-05-14 MED ORDER — LIDOCAINE HCL (CARDIAC) PF 50 MG/5ML IV SOSY
PREFILLED_SYRINGE | INTRAVENOUS | Status: DC | PRN
  Administered 2018-05-14: 40 mg via INTRAVENOUS

## 2018-05-14 MED ORDER — PROPOFOL 10 MG/ML IV BOLUS
INTRAVENOUS | Status: DC | PRN
  Administered 2018-05-14: 160 mg via INTRAVENOUS

## 2018-05-14 MED ORDER — MIDAZOLAM HCL 2 MG/2ML IJ SOLN
INTRAMUSCULAR | Status: AC
  Filled 2018-05-14: qty 2

## 2018-05-14 MED ORDER — SODIUM CHLORIDE 0.9 % IR SOLN
Status: DC | PRN
  Administered 2018-05-14: 1000 mL

## 2018-05-14 MED ORDER — ACETAMINOPHEN 500 MG PO TABS
ORAL_TABLET | ORAL | Status: AC
  Filled 2018-05-14: qty 1

## 2018-05-14 MED ORDER — FENTANYL CITRATE (PF) 100 MCG/2ML IJ SOLN
INTRAMUSCULAR | Status: DC | PRN
  Administered 2018-05-14: 50 ug via INTRAVENOUS
  Administered 2018-05-14 (×4): 25 ug via INTRAVENOUS
  Administered 2018-05-14: 50 ug via INTRAVENOUS

## 2018-05-14 MED ORDER — VANCOMYCIN HCL 10 G IV SOLR
INTRAVENOUS | Status: AC
  Filled 2018-05-14: qty 1500

## 2018-05-14 MED ORDER — ONDANSETRON HCL 4 MG/2ML IJ SOLN: 4.0000 mg | Freq: Once | INTRAMUSCULAR | Status: DC | PRN

## 2018-05-14 MED ORDER — HYDROCODONE-ACETAMINOPHEN 5-325 MG PO TABS: 1.0000 | ORAL_TABLET | ORAL | Status: DC | PRN

## 2018-05-14 MED ORDER — ONDANSETRON HCL 4 MG/2ML IJ SOLN
INTRAMUSCULAR | Status: AC
  Filled 2018-05-14: qty 2

## 2018-05-14 MED ORDER — HYDROCODONE-ACETAMINOPHEN 7.5-325 MG PO TABS
ORAL_TABLET | ORAL | Status: AC
  Filled 2018-05-14: qty 1

## 2018-05-14 MED ORDER — MEPERIDINE HCL 50 MG/ML IJ SOLN: 6.2500 mg | INTRAMUSCULAR | Status: DC | PRN

## 2018-05-14 MED ORDER — ONDANSETRON HCL 4 MG/2ML IJ SOLN
4.0000 mg | Freq: Once | INTRAMUSCULAR | Status: AC
  Administered 2018-05-14: 4 mg via INTRAVENOUS

## 2018-05-14 MED ORDER — SODIUM CHLORIDE 0.9 % IR SOLN
Status: DC | PRN
  Administered 2018-05-14 (×3): 3000 mL

## 2018-05-14 MED ORDER — HYDROMORPHONE HCL 1 MG/ML IJ SOLN
0.2500 mg | INTRAMUSCULAR | Status: DC | PRN
  Administered 2018-05-14 (×2): 0.5 mg via INTRAVENOUS
  Filled 2018-05-14 (×2): qty 0.5

## 2018-05-14 MED ORDER — FENTANYL CITRATE (PF) 250 MCG/5ML IJ SOLN
INTRAMUSCULAR | Status: AC
  Filled 2018-05-14: qty 5

## 2018-05-14 MED ORDER — ACETAMINOPHEN 500 MG PO TABS
500.0000 mg | ORAL_TABLET | Freq: Once | ORAL | Status: AC
  Administered 2018-05-14: 500 mg via ORAL

## 2018-05-14 MED ORDER — LIDOCAINE HCL (PF) 1 % IJ SOLN
INTRAMUSCULAR | Status: AC
  Filled 2018-05-14: qty 10

## 2018-05-14 MED ORDER — HYDROCODONE-ACETAMINOPHEN 7.5-325 MG PO TABS
1.0000 | ORAL_TABLET | Freq: Once | ORAL | Status: AC | PRN
  Administered 2018-05-14: 1 via ORAL

## 2018-05-14 MED ORDER — BUPIVACAINE-EPINEPHRINE (PF) 0.5% -1:200000 IJ SOLN
INTRAMUSCULAR | Status: AC
  Filled 2018-05-14: qty 60

## 2018-05-14 SURGICAL SUPPLY — 49 items
BANDAGE ELASTIC 6 LF NS (GAUZE/BANDAGES/DRESSINGS) ×3 IMPLANT
BLADE AGGRESSIVE PLUS 4.0 (BLADE) ×3 IMPLANT
BLADE SURG SZ11 CARB STEEL (BLADE) ×3 IMPLANT
BNDG CMPR MED 5X6 ELC HKLP NS (GAUZE/BANDAGES/DRESSINGS) ×1
CHLORAPREP W/TINT 26ML (MISCELLANEOUS) ×3 IMPLANT
CLOTH BEACON ORANGE TIMEOUT ST (SAFETY) ×3 IMPLANT
COOLER CRYO IC GRAV AND TUBE (ORTHOPEDIC SUPPLIES) ×3 IMPLANT
CUTTER ANGLED AGGR PLUS 4.0 (BURR) ×2 IMPLANT
DECANTER SPIKE VIAL GLASS SM (MISCELLANEOUS) ×6 IMPLANT
GAUZE 4X4 16PLY RFD (DISPOSABLE) ×3 IMPLANT
GAUZE SPONGE 4X4 12PLY STRL (GAUZE/BANDAGES/DRESSINGS) ×3 IMPLANT
GAUZE SPONGE 4X4 16PLY XRAY LF (GAUZE/BANDAGES/DRESSINGS) ×3 IMPLANT
GAUZE XEROFORM 5X9 LF (GAUZE/BANDAGES/DRESSINGS) ×3 IMPLANT
GLOVE BIOGEL M 7.0 STRL (GLOVE) ×2 IMPLANT
GLOVE BIOGEL PI IND STRL 7.0 (GLOVE) ×2 IMPLANT
GLOVE BIOGEL PI INDICATOR 7.0 (GLOVE) ×4
GLOVE SKINSENSE NS SZ8.0 LF (GLOVE) ×2
GLOVE SKINSENSE STRL SZ8.0 LF (GLOVE) ×1 IMPLANT
GLOVE SS N UNI LF 8.5 STRL (GLOVE) ×3 IMPLANT
GOWN STRL REUS W/ TWL LRG LVL3 (GOWN DISPOSABLE) ×1 IMPLANT
GOWN STRL REUS W/TWL LRG LVL3 (GOWN DISPOSABLE) ×3
GOWN STRL REUS W/TWL XL LVL3 (GOWN DISPOSABLE) ×3 IMPLANT
HLDR LEG FOAM (MISCELLANEOUS) ×1 IMPLANT
IV NS IRRIG 3000ML ARTHROMATIC (IV SOLUTION) ×8 IMPLANT
KIT BLADEGUARD II DBL (SET/KITS/TRAYS/PACK) ×3 IMPLANT
KIT TURNOVER CYSTO (KITS) ×3 IMPLANT
LEG HOLDER FOAM (MISCELLANEOUS) ×2
MANIFOLD NEPTUNE II (INSTRUMENTS) ×3 IMPLANT
MARKER SKIN DUAL TIP RULER LAB (MISCELLANEOUS) ×3 IMPLANT
NDL HYPO 18GX1.5 BLUNT FILL (NEEDLE) ×1 IMPLANT
NDL HYPO 21X1.5 SAFETY (NEEDLE) ×1 IMPLANT
NDL SPNL 18GX3.5 QUINCKE PK (NEEDLE) ×1 IMPLANT
NEEDLE HYPO 18GX1.5 BLUNT FILL (NEEDLE) ×3 IMPLANT
NEEDLE HYPO 21X1.5 SAFETY (NEEDLE) ×3 IMPLANT
NEEDLE SPNL 18GX3.5 QUINCKE PK (NEEDLE) ×3 IMPLANT
NS IRRIG 1000ML POUR BTL (IV SOLUTION) ×3 IMPLANT
PACK ARTHRO LIMB DRAPE STRL (MISCELLANEOUS) ×3 IMPLANT
PAD ABD 5X9 TENDERSORB (GAUZE/BANDAGES/DRESSINGS) ×3 IMPLANT
PAD ARMBOARD 7.5X6 YLW CONV (MISCELLANEOUS) ×3 IMPLANT
PADDING CAST COTTON 6X4 STRL (CAST SUPPLIES) ×3 IMPLANT
PROBE BIPOLAR 50 DEGREE SUCT (MISCELLANEOUS) ×2 IMPLANT
SET ARTHROSCOPY INST (INSTRUMENTS) ×3 IMPLANT
SET BASIN LINEN APH (SET/KITS/TRAYS/PACK) ×3 IMPLANT
SUT ETHILON 3 0 FSL (SUTURE) ×3 IMPLANT
SYR 10ML LL (SYRINGE) ×3 IMPLANT
SYR 30ML LL (SYRINGE) ×3 IMPLANT
TUBE CONNECTING 12'X1/4 (SUCTIONS) ×2
TUBE CONNECTING 12X1/4 (SUCTIONS) ×4 IMPLANT
TUBING ARTHRO INFLOW-ONLY STRL (TUBING) ×3 IMPLANT

## 2018-05-14 NOTE — Brief Op Note (Addendum)
05/14/2018  12:42 PM  PATIENT:  Samantha Larsen  62 y.o. 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 The chart was reviewed and updated. The surgical site was confirmed as right  knee and marked with an indelible marker.  The patient was taken to the operating room for anesthesia. After successful  general anesthesia, preoperative antibiotic was given The patient was placed in the supine position with the (right) the operative extremity in an arthroscopic leg holder and the opposite extremity in a padded leg holder.  The timeout was executed.  A lateral portal was established with an 11 blade and the scope was introduced into the joint. A diagnostic arthroscopy was performed in circumferential manner examining the entire knee joint. A medial portal was established and the diagnostic arthroscopy was repeated using a probe to palpate intra-articular structures as they were encountered.   The lateral meniscus was addressed first there was a tear in the posterior horn it was oblique and radial this was resected with a straight biter and meniscal fragments were removed with a shaver   The medial meniscus was resected using a duckbill forceps. The meniscal fragments were removed with a motorized shaver. The meniscus was balanced with a combination of a motorized shaver and a 50 of attack wand and curved shaver until a stable rim was obtained.  A limited  chondroplasty was performed on the medial femoral condyle  The arthroscopic pump was placed on the wash mode and any excess debris was removed from the joint using suction.  60 cc of Marcaine with epinephrine was injected through the arthroscope.  The portals were closed with 3-0 nylon suture.  A sterile bandage, Ace wrap and Cryo/Cuff was placed and the Cryo/Cuff was activated. The patient was taken to the recovery room in stable condition.   PHYSICIAN ASSISTANT:   ASSISTANTS: none   ANESTHESIA:   general  EBL:  0 mL   BLOOD ADMINISTERED:none  DRAINS: none   LOCAL MEDICATIONS USED:  MARCAINE     SPECIMEN:  No Specimen  DISPOSITION OF SPECIMEN:  N/A  COUNTS:  YES  TOURNIQUET:  * Missing tourniquet times found for documented tourniquets in log: [id number] *  DICTATION: .Dragon Dictation  PLAN OF CARE: Discharge to home after PACU  PATIENT DISPOSITION:  PACU - hemodynamically stable.   Delay start of Pharmacological VTE agent (>24hrs) due to surgical blood loss or risk of bleeding: not applicable

## 2018-05-14 NOTE — Interval H&P Note (Signed)
History and Physical Interval Note:  05/14/2018 11:09 AM  Samantha Larsen  has presented today for surgery, with the diagnosis of Torn medial meniscus right knee  The various methods of treatment have been discussed with the patient and family. After consideration of risks, benefits and other options for treatment, the patient has consented to  Procedure(s): KNEE ARTHROSCOPY WITH MEDIAL MENISCECTOMY (Right) as a surgical intervention .  The patient's history has been reviewed, patient examined, no change in status, stable for surgery.  I have reviewed the patient's chart and labs.  Questions were answered to the patient's satisfaction.     Arther Abbott

## 2018-05-14 NOTE — Op Note (Signed)
05/14/2018  12:42 PM  PATIENT:  Samantha Larsen  62 y.o. 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 The chart was reviewed and updated. The surgical site was confirmed as right  knee and marked with an indelible marker.  The patient was taken to the operating room for anesthesia. After successful  general anesthesia, preoperative antibiotic was given The patient was placed in the supine position with the (right) the operative extremity in an arthroscopic leg holder and the opposite extremity in a padded leg holder.  The timeout was executed.  A lateral portal was established with an 11 blade and the scope was introduced into the joint. A diagnostic arthroscopy was performed in circumferential manner examining the entire knee joint. A medial portal was established and the diagnostic arthroscopy was repeated using a probe to palpate intra-articular structures as they were encountered.   The lateral meniscus was addressed first there was a tear in the posterior horn it was oblique and radial this was resected with a straight biter and meniscal fragments were removed with a shaver   The medial meniscus was resected using a duckbill forceps. The meniscal fragments were removed with a motorized shaver. The meniscus was balanced with a combination of a motorized shaver and a 50 of attack wand and curved shaver until a stable rim was obtained.  A limited  chondroplasty was performed on the medial femoral condyle  The arthroscopic pump was placed on the wash mode and any excess debris was removed from the joint using suction.  60 cc of Marcaine with epinephrine was injected through the arthroscope.  The portals were closed with 3-0 nylon suture.  A sterile bandage, Ace wrap and Cryo/Cuff was placed and the Cryo/Cuff was activated. The patient was taken to the recovery room in stable condition.   PHYSICIAN ASSISTANT:   ASSISTANTS: none   ANESTHESIA:   general  EBL:  0 mL   BLOOD ADMINISTERED:none  DRAINS: none   LOCAL MEDICATIONS USED:  MARCAINE     SPECIMEN:  No Specimen  DISPOSITION OF SPECIMEN:  N/A  COUNTS:  YES  TOURNIQUET:  * Missing tourniquet times found for documented tourniquets in log: [id number] *  DICTATION: .Dragon Dictation  PLAN OF CARE: Discharge to home after PACU  PATIENT DISPOSITION:  PACU - hemodynamically stable.   Delay start of Pharmacological VTE agent (>24hrs) due to surgical blood loss or risk of bleeding: not applicable

## 2018-05-14 NOTE — Anesthesia Postprocedure Evaluation (Signed)
Anesthesia Post Note  Patient: Samantha Larsen  Procedure(s) Performed: RIGHT KNEE ARTHROSCOPY WITH PARTIAL MEDIAL AND LATERAL MENISCECTOMY (Right Knee)  Patient location during evaluation: PACU Anesthesia Type: General Level of consciousness: awake and patient cooperative Pain management: pain level controlled Vital Signs Assessment: post-procedure vital signs reviewed and stable Respiratory status: spontaneous breathing, nonlabored ventilation and respiratory function stable Cardiovascular status: blood pressure returned to baseline Postop Assessment: no apparent nausea or vomiting Anesthetic complications: no     Last Vitals:  Vitals:   05/14/18 1315 05/14/18 1330  BP: (!) 168/83   Pulse: (!) 55 (!) 53  Resp: 14 13  Temp:    SpO2: 96% 92%    Last Pain:  Vitals:   05/14/18 1330  TempSrc:   PainSc: 4                  [name] J

## 2018-05-14 NOTE — Discharge Instructions (Signed)
You had to torn menisci medial and lateral both were trimmed and you had arthritis in the knee as well  Pain medication use Ultracet 1 to 2 tablets every 4 hours as needed you have several refills if you run out of medication  Knee Arthroscopy, Care After Refer to this sheet in the next few weeks. These instructions provide you with information about caring for yourself after your procedure. Your health care provider may also give you more specific instructions. Your treatment has been planned according to current medical practices, but problems sometimes occur. Call your health care provider if you have any problems or questions after your procedure. What can I expect after the procedure? After the procedure, it is common to have:  Soreness.  Pain.  Follow these instructions at home: Bathing  Do not take baths, swim, or use a hot tub until your health care provider approves. Incision care  There are many different ways to close and cover an incision, including stitches, skin glue, and adhesive strips. Follow your health care providers instructions about: ? Incision care. ? Bandage (dressing) changes and removal. ? Incision closure removal.  Check your incision area every day for signs of infection. Watch for: ? Redness, swelling, or pain. ? Fluid, blood, or pus. Activity  Avoid strenuous activities for as long as directed by your health care provider.  Return to your normal activities as directed by your health care provider. Ask your health care provider what activities are safe for you.  Perform range-of-motion exercises only as directed by your health care provider.  Do not lift anything that is heavier than 10 lb (4.5 kg).  Do not drive or operate heavy machinery while taking pain medicine.  If you were given crutches, use them as directed by your health care provider. Managing pain, stiffness, and swelling  If directed, apply ice to the injured area: ? Put ice in a  plastic bag. ? Place a towel between your skin and the bag. ? Leave the ice on for 20 minutes, 2-3 times per day.  Raise the injured area above the level of your heart while you are sitting or lying down as directed by your health care provider. General instructions  Keep all follow-up visits as directed by your health care provider. This is important.  Take medicines only as directed by your health care provider.  Do not use any tobacco products, including cigarettes, chewing tobacco, or electronic cigarettes. If you need help quitting, ask your health care provider.  If you were given compression stockings, wear them as directed by your health care provider. These stockings help prevent blood clots and reduce swelling in your legs. Contact a health care provider if:  You have severe pain with any movement of your knee.  You notice a bad smell coming from the incision or dressing.  You have redness, swelling, or pain at the site of your incision.  You have fluid, blood, or pus coming from your incision. Get help right away if:  You develop a rash.  You have a fever.  You have difficulty breathing or have shortness of breath.  You develop pain in your calves or in the back of your knee.  You develop chest pain.  You develop numbness or tingling in your leg or foot. This information is not intended to replace advice given to you by your health care provider. Make sure you discuss any questions you have with your health care provider. Document Released: 03/01/2005 Document Revised:  01/12/2016 Document Reviewed: 08/08/2014 Elsevier Interactive Patient Education  2018 [place] Anesthesia, Adult, Care After These instructions provide you with information about caring for yourself after your procedure. Your health care provider may also give you more specific instructions. Your treatment has been planned according to current medical practices, but problems  sometimes occur. Call your health care provider if you have any problems or questions after your procedure. What can I expect after the procedure? After the procedure, it is common to have:  Vomiting.  A sore throat.  Mental slowness.  It is common to feel:  Nauseous.  Cold or shivery.  Sleepy.  Tired.  Sore or achy, even in parts of your body where you did not have surgery.  Follow these instructions at home: For at least 24 hours after the procedure:  Do not: ? Participate in activities where you could fall or become injured. ? Drive. ? Use heavy machinery. ? Drink alcohol. ? Take sleeping pills or medicines that cause drowsiness. ? Make important decisions or sign legal documents. ? Take care of children on your own.  Rest. Eating and drinking  If you vomit, drink water, juice, or soup when you can drink without vomiting.  Drink enough fluid to keep your urine clear or pale yellow.  Make sure you have little or no nausea before eating solid foods.  Follow the diet recommended by your health care provider. General instructions  Have a responsible adult stay with you until you are awake and alert.  Return to your normal activities as told by your health care provider. Ask your health care provider what activities are safe for you.  Take over-the-counter and prescription medicines only as told by your health care provider.  If you smoke, do not smoke without supervision.  Keep all follow-up visits as told by your health care provider. This is important. Contact a health care provider if:  You continue to have nausea or vomiting at home, and medicines are not helpful.  You cannot drink fluids or start eating again.  You cannot urinate after 8-12 hours.  You develop a skin rash.  You have fever.  You have increasing redness at the site of your procedure. Get help right away if:  You have difficulty breathing.  You have chest pain.  You have  unexpected bleeding.  You feel that you are having a life-threatening or urgent problem. This information is not intended to replace advice given to you by your health care provider. Make sure you discuss any questions you have with your health care provider. Document Released: 11/18/2000 Document Revised: 01/15/2016 Document Reviewed: 07/27/2015 Elsevier Interactive Patient Education  [employer].

## 2018-05-14 NOTE — Anesthesia Preprocedure Evaluation (Signed)
Anesthesia Evaluation  Patient identified by MRN, date of birth, ID band Patient awake    Reviewed: Allergy & Precautions, H&P , NPO status , Patient's Chart, lab work & pertinent test results, reviewed documented beta blocker date and time   Airway Mallampati: II  TM Distance: >3 FB Neck ROM: full    Dental no notable dental hx.    Pulmonary neg pulmonary ROS, asthma ,    Pulmonary exam normal breath sounds clear to auscultation       Cardiovascular Exercise Tolerance: Good hypertension, negative cardio ROS   Rhythm:regular Rate:Normal     Neuro/Psych negative neurological ROS  negative psych ROS   GI/Hepatic negative GI ROS, Neg liver ROS, GERD  ,  Endo/Other  negative endocrine ROSMorbid obesity  Renal/GU negative Renal ROS  negative genitourinary   Musculoskeletal negative musculoskeletal ROS (+)   Abdominal   Peds negative pediatric ROS (+)  Hematology negative hematology ROS (+)   Anesthesia Other Findings   Reproductive/Obstetrics negative OB ROS                             Anesthesia Physical Anesthesia Plan  ASA: III  Anesthesia Plan: MAC   Post-op Pain Management:    Induction:   PONV Risk Score and Plan:   Airway Management Planned:   Additional Equipment:   Intra-op Plan:   Post-operative Plan:   Informed Consent: I have reviewed the patients History and Physical, chart, labs and discussed the procedure including the risks, benefits and alternatives for the proposed anesthesia with the patient or authorized representative who has indicated his/her understanding and acceptance.   Dental Advisory Given  Plan Discussed with: CRNA  Anesthesia Plan Comments:         Anesthesia Quick Evaluation

## 2018-05-14 NOTE — Transfer of Care (Signed)
Immediate Anesthesia Transfer of Care Note  Patient: Samantha Larsen  Procedure(s) Performed: RIGHT KNEE ARTHROSCOPY WITH PARTIAL MEDIAL AND LATERAL MENISCECTOMY (Right Knee)  Patient Location: PACU  Anesthesia Type:General  Level of Consciousness: awake, alert  and oriented  Airway & Oxygen Therapy: Patient Spontanous Breathing  Post-op Assessment: Report given to RN  Post vital signs: Reviewed and stable  Last Vitals:  Vitals Value Taken Time  BP    Temp    Pulse 74 05/14/2018 12:48 PM  Resp 18 05/14/2018 12:48 PM  SpO2 96 % 05/14/2018 12:48 PM  Vitals shown include unvalidated device data.  Last Pain:  Vitals:   05/14/18 1029  TempSrc: Oral  PainSc: 0-No pain      Patients Stated Pain Goal: 6 (01/02/70 2524)  Complications: No apparent anesthesia complications

## 2018-05-14 NOTE — Anesthesia Procedure Notes (Addendum)
Procedure Name: LMA Insertion Date/Time: 05/14/2018 11:23 AM Performed by: Ollen Bowl, CRNA Pre-anesthesia Checklist: Patient identified, Patient being monitored, Emergency Drugs available, Timeout performed and Suction available Patient Re-evaluated:Patient Re-evaluated prior to induction Oxygen Delivery Method: Circle System Utilized Preoxygenation: Pre-oxygenation with 100% oxygen Induction Type: IV induction Ventilation: Mask ventilation without difficulty LMA: LMA inserted LMA Size: 4.0 Number of attempts: 1 Placement Confirmation: positive ETCO2 and breath sounds checked- equal and bilateral

## 2018-05-15 ENCOUNTER — Encounter (HOSPITAL_COMMUNITY): Payer: Self-pay | Admitting: Orthopedic Surgery

## 2018-05-22 ENCOUNTER — Ambulatory Visit (INDEPENDENT_AMBULATORY_CARE_PROVIDER_SITE_OTHER): Payer: BC Managed Care – PPO | Admitting: Orthopedic Surgery

## 2018-05-22 VITALS — BP 135/75 | HR 63 | Ht 59.0 in | Wt 224.0 lb

## 2018-05-22 DIAGNOSIS — Z9889 Other specified postprocedural states: Secondary | ICD-10-CM

## 2018-05-22 NOTE — Progress Notes (Signed)
Chief Complaint  Patient presents with  . Follow-up    Recheck on right knee, DOS 05-14-18.   05/14/2018  12:42 PM PROCEDURE:  Procedure(s): KNEE ARTHROSCOPY WITH PARTIAL MEDIAL AND LATERAL MENISCECTOMY (Right) - 29880  PATIENT:  Samantha Larsen  61 y.o. 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  . S/P right knee arthroscopy 05/14/18 Yes

## 2018-05-22 NOTE — Patient Instructions (Signed)
Continue knee exercises  Continue ice  Gradual return to normal activity

## 2018-05-29 ENCOUNTER — Ambulatory Visit: Primary: MD

## 2018-05-29 DIAGNOSIS — Z23 Encounter for immunization: Secondary | ICD-10-CM

## 2018-06-12 ENCOUNTER — Encounter: Payer: Self-pay | Admitting: Orthopedic Surgery

## 2018-06-12 ENCOUNTER — Ambulatory Visit (INDEPENDENT_AMBULATORY_CARE_PROVIDER_SITE_OTHER): Payer: BC Managed Care – PPO | Admitting: Orthopedic Surgery

## 2018-06-12 VITALS — BP 135/73 | HR 57 | Ht 59.0 in | Wt 228.0 lb

## 2018-06-12 DIAGNOSIS — Z4889 Encounter for other specified surgical aftercare: Secondary | ICD-10-CM

## 2018-06-12 DIAGNOSIS — Z9889 Other specified postprocedural states: Secondary | ICD-10-CM

## 2018-06-12 NOTE — Progress Notes (Signed)
Chief Complaint  Patient presents with  . Knee Injury    Right knee 05/14/18    05/14/2018  12:42 PM  PATIENT:  Samantha Larsen  62 y.o. 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

## 2018-08-10 ENCOUNTER — Ambulatory Visit (INDEPENDENT_AMBULATORY_CARE_PROVIDER_SITE_OTHER): Payer: BC Managed Care – PPO | Admitting: Orthopedic Surgery

## 2018-08-10 VITALS — BP 132/82 | HR 63 | Ht 59.0 in | Wt 224.0 lb

## 2018-08-10 DIAGNOSIS — Z9889 Other specified postprocedural states: Secondary | ICD-10-CM | POA: Diagnosis not present

## 2018-08-10 DIAGNOSIS — M25561 Pain in right knee: Secondary | ICD-10-CM

## 2018-08-10 DIAGNOSIS — M1711 Unilateral primary osteoarthritis, right knee: Secondary | ICD-10-CM | POA: Diagnosis not present

## 2018-08-10 DIAGNOSIS — Z6841 Body Mass Index (BMI) 40.0 and over, adult: Secondary | ICD-10-CM

## 2018-08-10 NOTE — Progress Notes (Signed)
Chief Complaint  Patient presents with  . Follow-up    Recheck on right knee, DOS 05-14-18.   62 year old female had arthroscopy back in September comes in complaining of pain and swelling of the right knee.  She has had a partial lateral and medial meniscectomy she had a chondroplasty of the medial femoral condyle she has chondromalacia of the patella  She comes in complaining of right knee swelling since Thanksgiving with medial pain at the joint line and just below over the peds anserine tendons with bursal swelling and pain painful terminal flexion and appearance of swelling of the superior aspect of the joint near the patellofemoral region  Review of Systems  Constitutional: Negative for fever.  Skin: Negative.    She has had some issues with swelling after taking ibuprofen but says when she takes her antihistamine she can take an Aleve at night when needed which she has done for the knee along with ice  Past Medical History:  Diagnosis Date  . Arthritis   . Asthma   . GERD (gastroesophageal reflux disease)   . Hypercholesteremia   . Hypertension     BP 132/82   Pulse 63   Ht 4\' 11"  (1.499 m)   Wt 224 lb (101.6 kg)   BMI 45.24 kg/m   General appearance is normal Oriented x3 Mood is pleasant affect is normal Gait shows no major limp  Right knee is tender over the bursa which is swollen she is tender over the medial joint line she has right leg larger than left at the quadriceps region the knee is stable she has pain at terminal flexion of 125 degrees muscle strength and tone are normal skin looks good pulses are excellent she has normal sensation in the limb  The left knee is again not as swollen as the right  Encounter Diagnoses  Name Primary?  . S/P right knee arthroscopy 05/14/18 Yes  . Body mass index 45.0-49.9, adult ([place])   . Morbid obesity ([place])   . Primary osteoarthritis of right knee   . Acute pain of right knee     I injected her knee told her to keep  icing it she will try to take an Aleve with her antihistamine during the daytime in case she has any trouble  We will see her back on a as needed basis  The patient meets the AMA guidelines for Morbid (severe) obesity with a BMI > 40.0 and I have recommended weight loss.   Procedure note right knee injection verbal consent was obtained to inject right knee joint  Timeout was completed to confirm the site of injection  The medications used were 40 mg of Depo-Medrol and 1% lidocaine 3 cc  Anesthesia was provided by ethyl chloride and the skin was prepped with alcohol.  After cleaning the skin with alcohol a 20-gauge needle was used to inject the right knee joint. There were no complications. A sterile bandage was applied.

## 2018-08-24 ENCOUNTER — Other Ambulatory Visit: Payer: Self-pay | Admitting: Obstetrics and Gynecology

## 2018-08-24 DIAGNOSIS — R928 Other abnormal and inconclusive findings on diagnostic imaging of breast: Secondary | ICD-10-CM

## 2018-08-25 ENCOUNTER — Other Ambulatory Visit: Payer: Self-pay | Admitting: Obstetrics and Gynecology

## 2018-08-25 ENCOUNTER — Ambulatory Visit
Admission: RE | Admit: 2018-08-25 | Discharge: 2018-08-25 | Disposition: A | Discharge status: Home / Self Care | Payer: BC Managed Care – PPO | Source: Ambulatory Visit | Attending: Obstetrics and Gynecology | Admitting: Obstetrics and Gynecology

## 2018-08-25 DIAGNOSIS — N6320 Unspecified lump in the left breast, unspecified quadrant: Secondary | ICD-10-CM

## 2018-08-25 DIAGNOSIS — R928 Other abnormal and inconclusive findings on diagnostic imaging of breast: Secondary | ICD-10-CM

## 2018-08-25 LAB — HM MAMMOGRAPHY

## 2018-11-09 ENCOUNTER — Ambulatory Visit: Payer: BC Managed Care – PPO | Admitting: Orthopedic Surgery

## 2018-11-10 MED ORDER — testosterone half-moon troche 2 mg
2 | ORAL | 2 refills | 28.00000 days | Status: DC
Start: 2018-11-10 — End: 2018-11-10

## 2018-11-11 ENCOUNTER — Encounter: Payer: Self-pay | Admitting: Orthopedic Surgery

## 2018-11-11 ENCOUNTER — Other Ambulatory Visit: Payer: Self-pay

## 2018-11-11 ENCOUNTER — Ambulatory Visit: Payer: BC Managed Care – PPO | Admitting: Orthopedic Surgery

## 2018-11-11 VITALS — BP 140/74 | HR 59 | Ht 59.0 in | Wt 222.0 lb

## 2018-11-11 DIAGNOSIS — M1711 Unilateral primary osteoarthritis, right knee: Secondary | ICD-10-CM

## 2018-11-11 DIAGNOSIS — Z9889 Other specified postprocedural states: Secondary | ICD-10-CM | POA: Diagnosis not present

## 2018-11-11 DIAGNOSIS — Z6841 Body Mass Index (BMI) 40.0 and over, adult: Secondary | ICD-10-CM | POA: Diagnosis not present

## 2018-11-11 NOTE — Patient Instructions (Signed)
To treat the arthritis we have plan  Aleve up to 3 times a day Biofreeze or Aspercreme with lidocaine topical Tramadol with MiraLAX Weight loss

## 2018-11-11 NOTE — Progress Notes (Signed)
Chief Complaint  Patient presents with  . Knee Pain    right has pain if she twists it or turns it     63 years old had a knee arthroscopy and meniscectomy of the medial and lateral meniscus with chondromalacia medial femoral condyle and femoral joint  She comes in today complaining of discomfort in her right knee when she bends straightens or twist the knee  She is currently on Aleve 3 times a day.  She does not take the Ultracet gives her constipation  She has had some questionable NSAID intolerance due to severe allergy including swelling.  But again notes that she can take up to 3 Aleve a day  She is concerned about her inability to walk more than 1/4 mile and is interested in further treatment regarding the right knee.  Pain best described as mild to moderate.  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 Negative for falls.  Neurological: Negative for tingling.   Past Medical History:  Diagnosis Date  . Arthritis   . Asthma   . GERD (gastroesophageal reflux disease)   . Hypercholesteremia   . Hypertension      Body mass index is 44.43 kg/m.     BP 140/74   Pulse (!) 59   Ht 4\' 11"  (1.499 m)   Wt 220 lb (99.8 kg)   BMI 44.43 kg/m   Physical Exam Vitals signs and nursing note reviewed.  Constitutional:      Appearance: Normal appearance.  Musculoskeletal:       Legs:  Neurological:     Mental Status: She is alert and oriented to person, place, and time.     Gait: Gait normal.  Psychiatric:        Mood and Affect: Mood normal.     The patient meets the AMA guidelines for Morbid (severe) obesity with a BMI > 40.0 and I have  recommended weight loss.  Encounter Diagnoses  Name Primary?  . S/P right knee arthroscopy 05/14/18 Yes  . Body mass index 45.0-49.9, adult ([place])   . Morbid obesity ([place])   . Primary osteoarthritis of right knee     Plan continue Aleve 3 times a day Recommend weight loss perhaps through primary care with medication Recommend topical medication for arthritis  Return 1 year for x-ray

## 2018-11-16 ENCOUNTER — Other Ambulatory Visit: Payer: Self-pay | Admitting: Orthopedic Surgery

## 2018-11-16 DIAGNOSIS — M23321 Other meniscus derangements, posterior horn of medial meniscus, right knee: Secondary | ICD-10-CM

## 2018-11-16 DIAGNOSIS — M7051 Other bursitis of knee, right knee: Secondary | ICD-10-CM

## 2018-11-16 DIAGNOSIS — M1711 Unilateral primary osteoarthritis, right knee: Secondary | ICD-10-CM

## 2018-11-16 DIAGNOSIS — M7052 Other bursitis of knee, left knee: Secondary | ICD-10-CM

## 2018-12-15 LAB — TSH: TSH: 4.57 (ref <=5.90)

## 2018-12-15 LAB — CBC AND DIFFERENTIAL
HCT: 40 (ref 36–46)
Hemoglobin: 13.5 (ref 12.0–16.0)
Platelets: 353 (ref 150–399)
WBC: 7

## 2018-12-15 LAB — CBC: RBC: 4.22 (ref 3.87–5.11)

## 2018-12-18 ENCOUNTER — Other Ambulatory Visit: Payer: Self-pay | Admitting: Orthopedic Surgery

## 2018-12-18 DIAGNOSIS — M1711 Unilateral primary osteoarthritis, right knee: Secondary | ICD-10-CM

## 2018-12-18 DIAGNOSIS — M7052 Other bursitis of knee, left knee: Secondary | ICD-10-CM

## 2018-12-18 DIAGNOSIS — M23321 Other meniscus derangements, posterior horn of medial meniscus, right knee: Secondary | ICD-10-CM

## 2018-12-18 DIAGNOSIS — M7051 Other bursitis of knee, right knee: Secondary | ICD-10-CM

## 2019-01-20 ENCOUNTER — Other Ambulatory Visit: Payer: Self-pay | Admitting: Orthopedic Surgery

## 2019-01-20 DIAGNOSIS — M1711 Unilateral primary osteoarthritis, right knee: Secondary | ICD-10-CM

## 2019-01-20 DIAGNOSIS — M23321 Other meniscus derangements, posterior horn of medial meniscus, right knee: Secondary | ICD-10-CM

## 2019-01-20 DIAGNOSIS — M7051 Other bursitis of knee, right knee: Secondary | ICD-10-CM

## 2019-03-01 ENCOUNTER — Other Ambulatory Visit: Payer: Self-pay

## 2019-03-01 ENCOUNTER — Ambulatory Visit
Admission: RE | Admit: 2019-03-01 | Discharge: 2019-03-01 | Disposition: A | Discharge status: Home / Self Care | Payer: BC Managed Care – PPO | Source: Ambulatory Visit | Attending: Obstetrics and Gynecology | Admitting: Obstetrics and Gynecology

## 2019-03-01 DIAGNOSIS — N6320 Unspecified lump in the left breast, unspecified quadrant: Secondary | ICD-10-CM

## 2019-03-24 ENCOUNTER — Other Ambulatory Visit: Admit: 2019-03-25 | Discharge: 2019-03-25 | Payer: PRIVATE HEALTH INSURANCE | Primary: MD

## 2019-03-24 DIAGNOSIS — N959 Unspecified menopausal and perimenopausal disorder: Secondary | ICD-10-CM

## 2019-03-25 LAB — TESTOSTERONE, TOTAL AND FREE, SERUM (MAYO)
Testosterone, Free: 0.09 ng/dL (ref 0.06–0.87)
Testosterone, Total: 17 ng/dL (ref 8–60)

## 2019-03-25 LAB — PROGESTERONE: Progesterone: 2.6 ng/mL (ref <50.8)

## 2019-03-25 LAB — ESTRADIOL: Estradiol: 52.3 pg/mL

## 2019-05-11 MED ORDER — finasteride (PROPECIA) 1 mg tablet
1 | ORAL_TABLET | ORAL | 1 refills | Status: DC
Start: 2019-05-11 — End: 2019-07-06
  Filled 2019-05-18: qty 8, 28d supply, fill #0

## 2019-05-28 ENCOUNTER — Encounter (INDEPENDENT_AMBULATORY_CARE_PROVIDER_SITE_OTHER): Payer: Self-pay | Admitting: *Deleted

## 2019-06-11 MED FILL — FINASTERIDE 1 MG TABLET: 1 mg | ORAL | 28 days supply | Qty: 8 | Fill #1

## 2019-06-18 ENCOUNTER — Other Ambulatory Visit (INDEPENDENT_AMBULATORY_CARE_PROVIDER_SITE_OTHER): Payer: Self-pay | Admitting: *Deleted

## 2019-06-18 DIAGNOSIS — Z800 Family history of malignant neoplasm of digestive organs: Secondary | ICD-10-CM | POA: Insufficient documentation

## 2019-06-18 DIAGNOSIS — Z86010 Personal history of colonic polyps: Secondary | ICD-10-CM

## 2019-06-29 ENCOUNTER — Other Ambulatory Visit (INDEPENDENT_AMBULATORY_CARE_PROVIDER_SITE_OTHER): Payer: Self-pay | Admitting: *Deleted

## 2019-06-29 ENCOUNTER — Encounter (INDEPENDENT_AMBULATORY_CARE_PROVIDER_SITE_OTHER): Payer: Self-pay | Admitting: *Deleted

## 2019-06-29 ENCOUNTER — Telehealth (INDEPENDENT_AMBULATORY_CARE_PROVIDER_SITE_OTHER): Payer: Self-pay | Admitting: *Deleted

## 2019-06-29 DIAGNOSIS — Z86010 Personal history of colonic polyps: Secondary | ICD-10-CM

## 2019-06-29 MED ORDER — SUPREP BOWEL PREP KIT 17.5-3.13-1.6 GM/177ML PO SOLN: 1.0000 | Freq: Once | ORAL | 0 refills | Status: AC

## 2019-06-29 NOTE — Telephone Encounter (Signed)
Patient needs suprep TCS sch'd 12/30

## 2019-07-06 MED ORDER — finasteride (PROPECIA) 1 mg tablet
1 | ORAL_TABLET | ORAL | 1 refills | Status: DC
Start: 2019-07-06 — End: 2020-01-17
  Filled 2019-07-08: qty 30, 90d supply, fill #0

## 2019-07-13 LAB — POC HEALTH PROMOTION BIOMETRIC SCREEN
Fasting Glucose: 83 mg/dL (ref ?–99)
HDL: 72 mg/dL (ref 60–?)
Non-HDL: 103 mg/dL (ref ?–130)
TC/HDL Ratio: 2.4 (ref ?–5.0)
Total Cholesterol: 175 mg/dL (ref ?–200)

## 2019-07-26 ENCOUNTER — Other Ambulatory Visit: Payer: Self-pay

## 2019-07-26 ENCOUNTER — Ambulatory Visit (INDEPENDENT_AMBULATORY_CARE_PROVIDER_SITE_OTHER): Payer: Self-pay

## 2019-07-26 ENCOUNTER — Telehealth (INDEPENDENT_AMBULATORY_CARE_PROVIDER_SITE_OTHER): Payer: Self-pay | Admitting: *Deleted

## 2019-07-26 NOTE — Telephone Encounter (Signed)
Referring MD/PCP: hawkins   Procedure: tcs  Reason/Indication:  Hx polyps, fam hx colon ca  Has patient had this procedure before?  Yes, 2015  If so, when, by whom and where?    Is there a family history of colon cancer?  Yes, aternal aunt  Who?  What age when diagnosed?    Is patient diabetic?   no      Does patient have prosthetic heart valve or mechanical valve?  no  Do you have a pacemaker/defibrillator?  no  Has patient ever had endocarditis/atrial fibrillation? non  Does patient use oxygen? o  Has patient had joint replacement within last 12 months?  no  Is patient constipated or do they take laxatives? no  Does patient have a history of alcohol/drug use?  no  Is patient on blood thinner such as Coumadin, Plavix and/or Aspirin? no  Medications: nadolol 40 mg daily, lansoprazole 30 mg daily, hctz 12.5 mg daily, simvastatin 40 mg daily, womens one a day vitamin, fish oil daily, fluticasone daily, ventolin prn, xyzal daily, folic acid & [id number] daily, afrin nasal spray prn, systane eye drops, aleve prn, CoQ10 daily, anucort prn  Allergies: pcn, ibuprofen  Medication Adjustment per Dr Laural Golden:   Procedure date & time: 08/25/19 at 1200

## 2019-08-02 NOTE — Telephone Encounter (Signed)
Colonoscopy with conscious sedation 

## 2019-08-06 ENCOUNTER — Other Ambulatory Visit: Payer: Self-pay

## 2019-08-06 DIAGNOSIS — Z20822 Contact with and (suspected) exposure to covid-19: Secondary | ICD-10-CM

## 2019-08-07 LAB — NOVEL CORONAVIRUS, NAA: SARS-CoV-2, NAA: NOT DETECTED

## 2019-08-18 ENCOUNTER — Other Ambulatory Visit: Payer: Self-pay

## 2019-08-18 MED ORDER — Bi-est 50/50 progesterone / DHEA capsule 1.25/125/10 mg
1.2512510 | ORAL_CAPSULE | ORAL | 1 refills | 90.00000 days | Status: AC
Start: 2019-08-18 — End: ?

## 2019-08-23 ENCOUNTER — Other Ambulatory Visit (HOSPITAL_COMMUNITY)
Admission: RE | Admit: 2019-08-23 | Discharge: 2019-08-23 | Disposition: A | Discharge status: Home / Self Care | Payer: BC Managed Care – PPO | Source: Ambulatory Visit | Attending: Internal Medicine | Admitting: Internal Medicine

## 2019-08-23 ENCOUNTER — Other Ambulatory Visit (HOSPITAL_COMMUNITY): Payer: BC Managed Care – PPO

## 2019-08-23 ENCOUNTER — Other Ambulatory Visit: Payer: Self-pay

## 2019-08-23 DIAGNOSIS — Z01812 Encounter for preprocedural laboratory examination: Secondary | ICD-10-CM | POA: Insufficient documentation

## 2019-08-23 DIAGNOSIS — Z20828 Contact with and (suspected) exposure to other viral communicable diseases: Secondary | ICD-10-CM | POA: Diagnosis not present

## 2019-08-23 LAB — SARS CORONAVIRUS 2 (TAT 6-24 HRS): SARS Coronavirus 2: NEGATIVE

## 2019-08-25 ENCOUNTER — Encounter (HOSPITAL_COMMUNITY)
Admission: RE | Disposition: A | Discharge status: Home / Self Care | Payer: Self-pay | Source: Home / Self Care | Attending: Internal Medicine

## 2019-08-25 ENCOUNTER — Ambulatory Visit (HOSPITAL_COMMUNITY)
Admission: RE | Admit: 2019-08-25 | Discharge: 2019-08-25 | Disposition: A | Discharge status: Home / Self Care | Payer: BC Managed Care – PPO | Attending: Internal Medicine | Admitting: Internal Medicine

## 2019-08-25 ENCOUNTER — Encounter (HOSPITAL_COMMUNITY): Payer: Self-pay | Admitting: Internal Medicine

## 2019-08-25 ENCOUNTER — Other Ambulatory Visit: Payer: Self-pay

## 2019-08-25 DIAGNOSIS — K219 Gastro-esophageal reflux disease without esophagitis: Secondary | ICD-10-CM | POA: Insufficient documentation

## 2019-08-25 DIAGNOSIS — Z79899 Other long term (current) drug therapy: Secondary | ICD-10-CM | POA: Insufficient documentation

## 2019-08-25 DIAGNOSIS — Z800 Family history of malignant neoplasm of digestive organs: Secondary | ICD-10-CM | POA: Insufficient documentation

## 2019-08-25 DIAGNOSIS — K5730 Diverticulosis of large intestine without perforation or abscess without bleeding: Secondary | ICD-10-CM

## 2019-08-25 DIAGNOSIS — Z86010 Personal history of colonic polyps: Secondary | ICD-10-CM | POA: Insufficient documentation

## 2019-08-25 DIAGNOSIS — D123 Benign neoplasm of transverse colon: Secondary | ICD-10-CM | POA: Diagnosis not present

## 2019-08-25 DIAGNOSIS — K621 Rectal polyp: Secondary | ICD-10-CM | POA: Insufficient documentation

## 2019-08-25 DIAGNOSIS — D122 Benign neoplasm of ascending colon: Secondary | ICD-10-CM | POA: Insufficient documentation

## 2019-08-25 DIAGNOSIS — I10 Essential (primary) hypertension: Secondary | ICD-10-CM | POA: Insufficient documentation

## 2019-08-25 DIAGNOSIS — Z7951 Long term (current) use of inhaled steroids: Secondary | ICD-10-CM | POA: Insufficient documentation

## 2019-08-25 DIAGNOSIS — M1990 Unspecified osteoarthritis, unspecified site: Secondary | ICD-10-CM | POA: Insufficient documentation

## 2019-08-25 DIAGNOSIS — J45909 Unspecified asthma, uncomplicated: Secondary | ICD-10-CM | POA: Diagnosis not present

## 2019-08-25 DIAGNOSIS — D125 Benign neoplasm of sigmoid colon: Secondary | ICD-10-CM

## 2019-08-25 DIAGNOSIS — E7800 Pure hypercholesterolemia, unspecified: Secondary | ICD-10-CM | POA: Insufficient documentation

## 2019-08-25 DIAGNOSIS — Z09 Encounter for follow-up examination after completed treatment for conditions other than malignant neoplasm: Secondary | ICD-10-CM | POA: Diagnosis not present

## 2019-08-25 DIAGNOSIS — K644 Residual hemorrhoidal skin tags: Secondary | ICD-10-CM | POA: Insufficient documentation

## 2019-08-25 HISTORY — PX: COLONOSCOPY: SHX5424

## 2019-08-25 HISTORY — PX: POLYPECTOMY: SHX5525

## 2019-08-25 LAB — HM COLONOSCOPY

## 2019-08-25 SURGERY — COLONOSCOPY
Anesthesia: Moderate Sedation

## 2019-08-25 MED ORDER — MIDAZOLAM HCL 5 MG/5ML IJ SOLN
INTRAMUSCULAR | Status: AC
  Filled 2019-08-25: qty 10

## 2019-08-25 MED ORDER — MIDAZOLAM HCL 5 MG/5ML IJ SOLN
INTRAMUSCULAR | Status: DC | PRN
  Administered 2019-08-25: 2 mg via INTRAVENOUS
  Administered 2019-08-25: 1 mg via INTRAVENOUS
  Administered 2019-08-25: 2 mg via INTRAVENOUS
  Administered 2019-08-25 (×2): 1 mg via INTRAVENOUS

## 2019-08-25 MED ORDER — MEPERIDINE HCL 50 MG/ML IJ SOLN
INTRAMUSCULAR | Status: AC
  Filled 2019-08-25: qty 1

## 2019-08-25 MED ORDER — MEPERIDINE HCL 50 MG/ML IJ SOLN
INTRAMUSCULAR | Status: DC | PRN
  Administered 2019-08-25 (×2): 25 mg via INTRAVENOUS

## 2019-08-25 MED ORDER — STERILE WATER FOR IRRIGATION IR SOLN
Status: DC | PRN
  Administered 2019-08-25: 1.5 mL

## 2019-08-25 MED ORDER — SODIUM CHLORIDE 0.9 % IV SOLN
INTRAVENOUS | Status: DC
  Administered 2019-08-25: 1000 mL via INTRAVENOUS

## 2019-08-25 NOTE — Op Note (Signed)
[facility] Patient Name: Samantha Larsen Procedure Date: 08/25/2019 9:25 AM MRN: [id number] Date of Birth: 08-26-1956 Attending MD: Hildred Laser , MD CSN: [id number] Age: 63 Admit Type: Outpatient Procedure:                Colonoscopy Indications:              High risk colon cancer surveillance: Personal                            history of colonic polyps Providers:                Hildred Laser, MD, Otis Peak B. Sharon Seller, RN, Raphael Gibney, Technician Referring MD:             Benny Lennert, MD Medicines:                Meperidine 50 mg IV, Midazolam 7 mg IV Complications:            No immediate complications. Estimated Blood Loss:     Estimated blood loss was minimal. Procedure:                Pre-Anesthesia Assessment:                           - Prior to the procedure, a History and Physical                            was performed, and patient medications and                            allergies were reviewed. The patient's tolerance of                            previous anesthesia was also reviewed. The risks                            and benefits of the procedure and the sedation                            options and risks were discussed with the patient.                            All questions were answered, and informed consent                            was obtained. Prior Anticoagulants: The patient has                            taken no previous anticoagulant or antiplatelet                            agents except for NSAID medication. ASA Grade  Assessment: II - A patient with mild systemic                            disease. After reviewing the risks and benefits,                            the patient was deemed in satisfactory condition to                            undergo the procedure.                           After obtaining informed consent, the colonoscope                            was passed under  direct vision. Throughout the                            procedure, the patient's blood pressure, pulse, and                            oxygen saturations were monitored continuously. The                            [id number]-[id number]) scope was introduced through                            the anus and advanced to the the cecum, identified                            by appendiceal orifice and ileocecal valve. The                            colonoscopy was performed without difficulty. The                            patient tolerated the procedure well. The quality                            of the bowel preparation was good. The ileocecal                            valve, appendiceal orifice, and rectum were                            photographed. Scope In: [id number] Scope Out: 10:24:54 AM Scope Withdrawal Time: 0 hours 32 minutes 39 seconds  Total Procedure Duration: 0 hours 35 minutes 35 seconds  Findings:      The perianal and digital rectal examinations were normal.      A 8 mm polyp was found in the proximal ascending colon. The polyp was       flat. The polyp was removed with a cold snare. Polyp resection was       incomplete. The resected tissue was retrieved. The pathology  specimen       was placed into Bottle Number 2. Coagulation for destruction of       remaining portion of lesion using argon plasma was successful.      Five polyps were found in the sigmoid colon, splenic flexure and       ascending colon. The polyps were 5 to 7 mm in size. These polyps were       removed with a cold snare. Resection and retrieval were complete. The       pathology specimen was placed into Bottle Number 1.      A diminutive polyp was found in the rectum. Biopsies were taken with a       cold forceps for histology. The pathology specimen was placed into       Bottle Number 1.      A single medium-mouthed diverticulum was found in the sigmoid colon.      External hemorrhoids were found  during retroflexion. The hemorrhoids       were small. Impression:               - One 8 mm polyp in the proximal ascending colon,                            removed with a cold snare. Incomplete resection.                            Resected tissue retrieved. Treated with argon                            plasma coagulation ([facility]).                           - Five 5 to 7 mm polyps in the sigmoid colon, at                            the splenic flexure and in the ascending colon,                            removed with a cold snare. Resected and retrieved.                           - One diminutive polyp in the rectum. Biopsied.                           - Diverticulosis in the sigmoid colon.                           - External hemorrhoids. Moderate Sedation:      Moderate (conscious) sedation was administered by the endoscopy nurse       and supervised by the endoscopist. The following parameters were       monitored: oxygen saturation, heart rate, blood pressure, CO2       capnography and response to care. Total physician intraservice time was       41 minutes. Recommendation:           - Patient has a contact number available for  emergencies. The signs and symptoms of potential                            delayed complications were discussed with the                            patient. Return to normal activities tomorrow.                            Written discharge instructions were provided to the                            patient.                           - Resume previous diet today.                           - Continue present medications.                           - No aspirin, ibuprofen, naproxen, or other                            non-steroidal anti-inflammatory drugs for 5 days.                           - Await pathology results.                           - Repeat colonoscopy is recommended. The                            colonoscopy date will  be determined after pathology                            results from today's exam become available for                            review. Procedure Code(s):        --- Professional ---                           [phone], Colonoscopy, flexible; with removal of                            tumor(s), polyp(s), or other lesion(s) by snare                            technique                           [id number], 59, Colonoscopy, flexible; with biopsy,                            single or multiple  [id number], Moderate sedation; each additional 15                            minutes intraservice time                           [id number], Moderate sedation; each additional 15                            minutes intraservice time                           G0500, Moderate sedation services provided by the                            same physician or other qualified health care                            professional performing a gastrointestinal                            endoscopic service that sedation supports,                            requiring the presence of an independent trained                            observer to assist in the monitoring of the                            patient's level of consciousness and physiological                            status; initial 15 minutes of intra-service time;                            patient age 55 years or older (additional time may                            be reported with [phone], as appropriate) Diagnosis Code(s):        --- Professional ---                           Z86.010, Personal history of colonic polyps                           K63.5, Polyp of colon                           K62.1, Rectal polyp                           K64.4, Residual hemorrhoidal skin tags                           K57.30, Diverticulosis of large intestine without  perforation or abscess without bleeding CPT copyright 2019 American  Medical Association. All rights reserved. The codes documented in this report are preliminary and upon coder review may  be revised to meet current compliance requirements. Hildred Laser, MD Hildred Laser, MD 08/25/2019 10:40:04 AM This report has been signed electronically. Number of Addenda: 0

## 2019-08-25 NOTE — H&P (Signed)
Samantha Larsen is an 63 y.o. female.   Chief Complaint: Patient is here for colonoscopy. HPI: Patient is 63 year old Caucasian female who has history of colonic adenomas and is here for surveillance colonoscopy.  She denies abdominal pain change in bowel habits or rectal bleeding.  This is her third colonoscopy.  She had adenoma removed on her first exam and she had 3 small adenomas removed on her second exam 5 years ago. History significant for CRC in maternal aunt who was younger than 79 at the time of diagnosis and died of metastatic disease few years later.  Past Medical History:  Diagnosis Date  . Arthritis   . Asthma   . GERD (gastroesophageal reflux disease)   . Hypercholesteremia   . Hypertension     Past Surgical History:  Procedure Laterality Date  . ABDOMINAL HYSTERECTOMY    . COLONOSCOPY N/A 05/18/2014   Procedure: COLONOSCOPY;  Surgeon: Rogene Houston, MD;  Location: [facility];  Service: Endoscopy;  Laterality: N/A;  1200  . DILATION AND CURETTAGE OF UTERUS     x 2  . KNEE ARTHROSCOPY WITH MEDIAL MENISECTOMY Right 05/14/2018   Procedure: RIGHT KNEE ARTHROSCOPY WITH PARTIAL MEDIAL AND LATERAL MENISCECTOMY;  Surgeon: Carole Civil, MD;  Location: [facility]S;  Service: Orthopedics;  Laterality: Right;  . lasik    . TUBAL LIGATION      Family History  Problem Relation Age of Onset  . Other Mother   . Dementia Father   . Heart murmur Father   . High blood pressure Father   . Arthritis Father   . High blood pressure Sister   . Arthritis Sister   . Colon cancer Paternal Aunt    Social History:  reports that she has never smoked. She has never used smokeless tobacco. She reports that she does not drink alcohol or use drugs.  Allergies:  Allergies  Allergen Reactions  . Ibuprofen Swelling    Welping  . Penicillins Hives and Swelling    Has patient had a PCN reaction causing immediate rash, facial/tongue/throat swelling, SOB or lightheadedness with hypotension:  YES Has patient had a PCN reaction causing severe rash involving mucus membranes or skin necrosis: NO Has patient had a PCN reaction that required hospitalization: No Has patient had a PCN reaction occurring within the last 10 years: No If all of the above answers are "NO", then may proceed with Cephalosporin use.     Medications Prior to Admission  Medication Sig Dispense Refill  . albuterol (PROVENTIL HFA;VENTOLIN HFA) 108 (90 BASE) MCG/ACT inhaler Inhale 1-2 puffs into the lungs every 6 (six) hours as needed for wheezing or shortness of breath.    . Cyanocobalamin (VITAMIN B-12) 5000 MCG TBDP Take 5,000 mcg by mouth at bedtime.     . fluticasone (FLONASE) 50 MCG/ACT nasal spray Place 2 sprays into both nostrils daily.    . folic acid (FOLVITE) [id number] MCG tablet Take 800 mcg by mouth at bedtime.     . hydrochlorothiazide (HYDRODIURIL) 12.5 MG tablet Take 12.5 mg by mouth daily.     . lansoprazole (PREVACID) 30 MG capsule Take 30 mg by mouth daily.     Marland Kitchen levocetirizine (XYZAL) 5 MG tablet Take 5 mg by mouth every evening.    . Multiple Vitamins-Minerals (MULTIVITAMINS THER. W/MINERALS) TABS tablet Take 1 tablet by mouth at bedtime. 50 +    . nadolol (CORGARD) 40 MG tablet Take 40 mg by mouth daily.     Marland Kitchen  naproxen sodium (ALEVE) 220 MG tablet Take 220 mg by mouth daily as needed (PAIN).     . Omega-3 Fatty Acids (FISH OIL) 1000 MG CAPS Take 1,000 mg by mouth at bedtime.     Marland Kitchen oxymetazoline (AFRIN) 0.05 % nasal spray Place 1 spray into both nostrils 2 (two) times daily as needed for congestion.    . phenylephrine (NEO-SYNEPHRINE) 0.5 % nasal solution Place 1 drop into both nostrils every 6 (six) hours as needed for congestion.    Marland Kitchen Propylene Glycol (SYSTANE BALANCE) 0.6 % SOLN Place 1 drop into both eyes 3 (three) times daily as needed (dry eyes).    . simvastatin (ZOCOR) 40 MG tablet Take 40 mg by mouth daily.    . traMADol-acetaminophen (ULTRACET) 37.5-325 MG tablet TAKE 1 TABLET BY MOUTH  EVERY 4 HOURS AS NEEDED. (Patient taking differently: Take 1 tablet by mouth every 4 (four) hours as needed for moderate pain. ) 30 tablet 0    No results found for this or any previous visit (from the past 48 hour(s)). No results found.  Review of Systems  Blood pressure 128/69, pulse (!) 53, resp. rate 17, height 4\' 11"  (1.499 m), weight 96.6 kg, SpO2 100 %. Physical Exam  Constitutional: She appears well-developed and well-nourished.  HENT:  Mouth/Throat: Oropharynx is clear and moist.  Eyes: Conjunctivae are normal. No scleral icterus.  Neck: No thyromegaly present.  Cardiovascular: Normal rate, regular rhythm and normal heart sounds.  No murmur heard. Respiratory: Effort normal and breath sounds normal.  GI:  Abdomen is full but soft and nontender without organomegaly or masses.  Musculoskeletal:        General: No edema.  Lymphadenopathy:    She has no cervical adenopathy.  Neurological: She is alert.  Skin: Skin is warm and dry.     Assessment/Plan History of colonic adenomas. Surveillance colonoscopy.  Samantha Laser, MD 08/25/2019, 9:39 AM

## 2019-08-25 NOTE — Discharge Instructions (Signed)
No aspirin or NSAIDs for 5 days. Resume other medications and diet as before No driving for 24 hours. Physician will call with biopsy results.   Colonoscopy, Adult, Care After This sheet gives you information about how to care for yourself after your procedure. Your doctor may also give you more specific instructions. If you have problems or questions, call your doctor. What can I expect after the procedure? After the procedure, it is common to have:  A small amount of blood in your poop for 24 hours.  Some gas.  Mild cramping or bloating in your belly. Follow these instructions at home: General instructions  For the first 24 hours after the procedure: ? Do not drive or use machinery. ? Do not sign important documents. ? Do not drink alcohol. ? Do your daily activities more slowly than normal. ? Eat foods that are soft and easy to digest.  Take over-the-counter or prescription medicines only as told by your doctor. To help cramping and bloating:   Try walking around.  Put heat on your belly (abdomen) as told by your doctor. Use a heat source that your doctor recommends, such as a moist heat pack or a heating pad. ? Put a towel between your skin and the heat source. ? Leave the heat on for 20-30 minutes. ? Remove the heat if your skin turns bright red. This is especially important if you cannot feel pain, heat, or cold. You can get burned. Eating and drinking   Drink enough fluid to keep your pee (urine) clear or pale yellow.  Return to your normal diet as told by your doctor. Avoid heavy or fried foods that are hard to digest.  Avoid drinking alcohol for as long as told by your doctor. Contact a doctor if:  You have blood in your poop (stool) 2-3 days after the procedure. Get help right away if:  You have more than a small amount of blood in your poop.  You see large clumps of tissue (blood clots) in your poop.  Your belly is swollen.  You feel sick to your  stomach (nauseous).  You throw up (vomit).  You have a fever.  You have belly pain that gets worse, and medicine does not help your pain. Summary  After the procedure, it is common to have a small amount of blood in your poop. You may also have mild cramping and bloating in your belly.  For the first 24 hours after the procedure, do not drive or use machinery, do not sign important documents, and do not drink alcohol.  Get help right away if you have a lot of blood in your poop, feel sick to your stomach, have a fever, or have more belly pain. This information is not intended to replace advice given to you by your health care provider. Make sure you discuss any questions you have with your health care provider. Document Released: 09/14/2010 Document Revised: 06/12/2017 Document Reviewed: 05/06/2016 Elsevier Patient Education  2020 [employer].  Hemorrhoids Hemorrhoids are swollen veins in and around the rectum or anus. There are two types of hemorrhoids:  Internal hemorrhoids. These occur in the veins that are just inside the rectum. They may poke through to the outside and become irritated and painful.  External hemorrhoids. These occur in the veins that are outside the anus and can be felt as a painful swelling or hard lump near the anus. Most hemorrhoids do not cause serious problems, and they can be managed with home  treatments such as diet and lifestyle changes. If home treatments do not help the symptoms, procedures can be done to shrink or remove the hemorrhoids. What are the causes? This condition is caused by increased pressure in the anal area. This pressure may result from various things, including:  Constipation.  Straining to have a bowel movement.  Diarrhea.  Pregnancy.  Obesity.  Sitting for long periods of time.  Heavy lifting or other activity that causes you to strain.  Anal sex.  Riding a bike for a long period of time. What are the signs or  symptoms? Symptoms of this condition include:  Pain.  Anal itching or irritation.  Rectal bleeding.  Leakage of stool (feces).  Anal swelling.  One or more lumps around the anus. How is this diagnosed? This condition can often be diagnosed through a visual exam. Other exams or tests may also be done, such as:  An exam that involves feeling the rectal area with a gloved hand (digital rectal exam).  An exam of the anal canal that is done using a small tube (anoscope).  A blood test, if you have lost a significant amount of blood.  A test to look inside the colon using a flexible tube with a camera on the end (sigmoidoscopy or colonoscopy). How is this treated? This condition can usually be treated at home. However, various procedures may be done if dietary changes, lifestyle changes, and other home treatments do not help your symptoms. These procedures can help make the hemorrhoids smaller or remove them completely. Some of these procedures involve surgery, and others do not. Common procedures include:  Rubber band ligation. Rubber bands are placed at the base of the hemorrhoids to cut off their blood supply.  Sclerotherapy. Medicine is injected into the hemorrhoids to shrink them.  Infrared coagulation. A type of light energy is used to get rid of the hemorrhoids.  Hemorrhoidectomy surgery. The hemorrhoids are surgically removed, and the veins that supply them are tied off.  Stapled hemorrhoidopexy surgery. The surgeon staples the base of the hemorrhoid to the rectal wall. Follow these instructions at home: Eating and drinking   Eat foods that have a lot of fiber in them, such as whole grains, beans, nuts, fruits, and vegetables.  Ask your health care provider about taking products that have added fiber (fiber supplements).  Reduce the amount of fat in your diet. You can do this by eating low-fat dairy products, eating less red meat, and avoiding processed foods.  Drink  enough fluid to keep your urine pale yellow. Managing pain and swelling   Take warm sitz baths for 20 minutes, 3-4 times a day to ease pain and discomfort. You may do this in a bathtub or using a portable sitz bath that fits over the toilet.  If directed, apply ice to the affected area. Using ice packs between sitz baths may be helpful. ? Put ice in a plastic bag. ? Place a towel between your skin and the bag. ? Leave the ice on for 20 minutes, 2-3 times a day. General instructions  Take over-the-counter and prescription medicines only as told by your health care provider.  Use medicated creams or suppositories as told.  Get regular exercise. Ask your health care provider how much and what kind of exercise is best for you. In general, you should do moderate exercise for at least 30 minutes on most days of the week (150 minutes each week). This can include activities such as walking, biking,  or yoga.  Go to the bathroom when you have the urge to have a bowel movement. Do not wait.  Avoid straining to have bowel movements.  Keep the anal area dry and clean. Use wet toilet paper or moist towelettes after a bowel movement.  Do not sit on the toilet for long periods of time. This increases blood pooling and pain.  Keep all follow-up visits as told by your health care provider. This is important. Contact a health care provider if you have:  Increasing pain and swelling that are not controlled by treatment or medicine.  Difficulty having a bowel movement, or you are unable to have a bowel movement.  Pain or inflammation outside the area of the hemorrhoids. Get help right away if you have:  Uncontrolled bleeding from your rectum. Summary  Hemorrhoids are swollen veins in and around the rectum or anus.  Most hemorrhoids can be managed with home treatments such as diet and lifestyle changes.  Taking warm sitz baths can help ease pain and discomfort.  In severe cases, procedures or  surgery can be done to shrink or remove the hemorrhoids. This information is not intended to replace advice given to you by your health care provider. Make sure you discuss any questions you have with your health care provider. Document Released: 08/09/2000 Document Revised: 08/20/2018 Document Reviewed: 01/01/2018 Elsevier Patient Education  2020 [employer].  Diverticulosis  Diverticulosis is a condition that develops when small pouches (diverticula) form in the wall of the large intestine (colon). The colon is where water is absorbed and stool is formed. The pouches form when the inside layer of the colon pushes through weak spots in the outer layers of the colon. You may have a few pouches or many of them. What are the causes? The cause of this condition is not known. What increases the risk? The following factors may make you more likely to develop this condition:  Being older than age 30. Your risk for this condition increases with age. Diverticulosis is rare among people younger than age 60. By age 53, many people have it.  Eating a low-fiber diet.  Having frequent constipation.  Being overweight.  Not getting enough exercise.  Smoking.  Taking over-the-counter pain medicines, like aspirin and ibuprofen.  Having a family history of diverticulosis. What are the signs or symptoms? In most people, there are no symptoms of this condition. If you do have symptoms, they may include:  Bloating.  Cramps in the abdomen.  Constipation or diarrhea.  Pain in the lower left side of the abdomen. How is this diagnosed? This condition is most often diagnosed during an exam for other colon problems. Because diverticulosis usually has no symptoms, it often cannot be diagnosed independently. This condition may be diagnosed by:  Using a flexible scope to examine the colon (colonoscopy).  Taking an X-ray of the colon after dye has been put into the colon (barium enema).  Doing a CT  scan. How is this treated? You may not need treatment for this condition if you have never developed an infection related to diverticulosis. If you have had an infection before, treatment may include:  Eating a high-fiber diet. This may include eating more fruits, vegetables, and grains.  Taking a fiber supplement.  Taking a live bacteria supplement (probiotic).  Taking medicine to relax your colon.  Taking antibiotic medicines. Follow these instructions at home:  Drink 6-8 glasses of water or more each day to prevent constipation.  Try not to  strain when you have a bowel movement.  If you have had an infection before: ? Eat more fiber as directed by your health care provider or your diet and nutrition specialist (dietitian). ? Take a fiber supplement or probiotic, if your health care provider approves.  Take over-the-counter and prescription medicines only as told by your health care provider.  If you were prescribed an antibiotic, take it as told by your health care provider. Do not stop taking the antibiotic even if you start to feel better.  Keep all follow-up visits as told by your health care provider. This is important. Contact a health care provider if:  You have pain in your abdomen.  You have bloating.  You have cramps.  You have not had a bowel movement in 3 days. Get help right away if:  Your pain gets worse.  Your bloating becomes very bad.  You have a fever or chills, and your symptoms suddenly get worse.  You vomit.  You have bowel movements that are bloody or black.  You have bleeding from your rectum. Summary  Diverticulosis is a condition that develops when small pouches (diverticula) form in the wall of the large intestine (colon).  You may have a few pouches or many of them.  This condition is most often diagnosed during an exam for other colon problems.  If you have had an infection related to diverticulosis, treatment may include  increasing the fiber in your diet, taking supplements, or taking medicines. This information is not intended to replace advice given to you by your health care provider. Make sure you discuss any questions you have with your health care provider. Document Released: 05/09/2004 Document Revised: 07/25/2017 Document Reviewed: 07/01/2016 Elsevier Patient Education  [place].  Colon Polyps  Polyps are tissue growths inside the body. Polyps can grow in many places, including the large intestine (colon). A polyp may be a round bump or a mushroom-shaped growth. You could have one polyp or several. Most colon polyps are noncancerous (benign). However, some colon polyps can become cancerous over time. Finding and removing the polyps early can help prevent this. What are the causes? The exact cause of colon polyps is not known. What increases the risk? You are more likely to develop this condition if you:  Have a family history of colon cancer or colon polyps.  Are older than 97 or older than 45 if you are African American.  Have inflammatory bowel disease, such as ulcerative colitis or Crohn's disease.  Have certain hereditary conditions, such as: ? Familial adenomatous polyposis. ? Lynch syndrome. ? Turcot syndrome. ? Peutz-Jeghers syndrome.  Are overweight.  Smoke cigarettes.  Do not get enough exercise.  Drink too much alcohol.  Eat a diet that is high in fat and red meat and low in fiber.  Had childhood cancer that was treated with abdominal radiation. What are the signs or symptoms? Most polyps do not cause symptoms. If you have symptoms, they may include:  Blood coming from your rectum when having a bowel movement.  Blood in your stool. The stool may look dark red or black.  Abdominal pain.  A change in bowel habits, such as constipation or diarrhea. How is this diagnosed? This condition is diagnosed with a colonoscopy. This is a procedure in which a lighted,  flexible scope is inserted into the anus and then passed into the colon to examine the area. Polyps are sometimes found when a colonoscopy is done as part of routine  cancer screening tests. How is this treated? Treatment for this condition involves removing any polyps that are found. Most polyps can be removed during a colonoscopy. Those polyps will then be tested for cancer. Additional treatment may be needed depending on the results of testing. Follow these instructions at home: Lifestyle  Maintain a healthy weight, or lose weight if recommended by your health care provider.  Exercise every day or as told by your health care provider.  Do not use any products that contain nicotine or tobacco, such as cigarettes and e-cigarettes. If you need help quitting, ask your health care provider.  If you drink alcohol, limit how much you have: ? 0-1 drink a day for women. ? 0-2 drinks a day for men.  Be aware of how much alcohol is in your drink. In the [place] one drink equals one 12 oz bottle of beer (355 mL), one 5 oz glass of wine (148 mL), or one 1 oz shot of hard liquor (44 mL). Eating and drinking   Eat foods that are high in fiber, such as fruits, vegetables, and whole grains.  Eat foods that are high in calcium and vitamin D, such as milk, cheese, yogurt, eggs, liver, fish, and broccoli.  Limit foods that are high in fat, such as fried foods and desserts.  Limit the amount of red meat and processed meat you eat, such as hot dogs, sausage, bacon, and lunch meats. General instructions  Keep all follow-up visits as told by your health care provider. This is important. ? This includes having regularly scheduled colonoscopies. ? Talk to your health care provider about when you need a colonoscopy. Contact a health care provider if:  You have new or worsening bleeding during a bowel movement.  You have new or increased blood in your stool.  You have a change in bowel habits.  You lose  weight for no known reason. Summary  Polyps are tissue growths inside the body. Polyps can grow in many places, including the colon.  Most colon polyps are noncancerous (benign), but some can become cancerous over time.  This condition is diagnosed with a colonoscopy.  Treatment for this condition involves removing any polyps that are found. Most polyps can be removed during a colonoscopy. This information is not intended to replace advice given to you by your health care provider. Make sure you discuss any questions you have with your health care provider. Document Released: 05/08/2004 Document Revised: 11/27/2017 Document Reviewed: 11/27/2017 Elsevier Patient Education  2020 [employer].

## 2019-08-26 DIAGNOSIS — I10 Essential (primary) hypertension: Secondary | ICD-10-CM | POA: Insufficient documentation

## 2019-08-26 DIAGNOSIS — K2100 Gastro-esophageal reflux disease with esophagitis, without bleeding: Secondary | ICD-10-CM | POA: Insufficient documentation

## 2019-08-26 DIAGNOSIS — E669 Obesity, unspecified: Secondary | ICD-10-CM | POA: Insufficient documentation

## 2019-08-26 DIAGNOSIS — J309 Allergic rhinitis, unspecified: Secondary | ICD-10-CM | POA: Insufficient documentation

## 2019-08-26 DIAGNOSIS — M722 Plantar fascial fibromatosis: Secondary | ICD-10-CM | POA: Insufficient documentation

## 2019-08-26 DIAGNOSIS — M1990 Unspecified osteoarthritis, unspecified site: Secondary | ICD-10-CM | POA: Insufficient documentation

## 2019-08-26 DIAGNOSIS — M25569 Pain in unspecified knee: Secondary | ICD-10-CM

## 2019-08-26 DIAGNOSIS — E785 Hyperlipidemia, unspecified: Secondary | ICD-10-CM | POA: Insufficient documentation

## 2019-08-26 LAB — SURGICAL PATHOLOGY

## 2019-10-07 MED FILL — FINASTERIDE 1 MG TABLET: 1 mg | ORAL | 90 days supply | Qty: 30 | Fill #1

## 2019-10-11 MED ORDER — amoxicillin (AMOXIL) 500 MG capsule
500 | ORAL_CAPSULE | Freq: Three times a day (TID) | ORAL | 0 refills | Status: AC
Start: 2019-10-11 — End: ?
  Filled 2019-10-11: qty 21, 7d supply, fill #0

## 2019-11-02 ENCOUNTER — Telehealth: Admit: 2019-11-02 | Discharge: 2019-11-02 | Attending: Family | Primary: MD

## 2019-11-02 DIAGNOSIS — Z7689 Persons encountering health services in other specified circumstances: Secondary | ICD-10-CM

## 2019-11-02 NOTE — Progress Notes (Signed)
Telephone Consultation Note:    Nancy Richard is a 64 y.o. ,female, calling today with a chief complaint of Return To Work (advice only, missed one day 3/5)  .    This visit was conducted via telephone due to COVID-19 restrictions and at the request of the patient.  Nancy Richard consented to this visit being performed over telephone.      Those present on the telephone call include: Patient.    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  Pt recounts working very hard outside last Wednesday 10/27/19 shoveling/moving dirt, went to work following day and noticed back muscles hurting, felt very fatigued, and called in sick 10/29/19.  Felt she was overtired after working too hard outside earlier in week.  On Saturday night began to experience sinus congestion and runny nose.  States, My nose started feeling tingly. Denies other symptoms. Has not been apprised of suspected/confirmed exposure to Covid 19 by either AM Team or PH Dept.    Began taking Vitamin C and ibuprofen for symptoms.  Says she spoke with someone at hospital about getting Covid swab, but was advised would need to schedule to speak with provider before getting order for swab, which she thought was too arduous. Symptoms now resolved for last 48 hours, feels back to baseline, no longer interested in getting swab.  Asking if can just be cleared to go back to work.    Immunocompetent pt.      Review of Systems   Constitutional: Negative.    HENT: Negative.    Respiratory: Negative.    Musculoskeletal: Negative.    Neurological: Negative.    All other systems reviewed and are negative.      Scheduled Meds:  Continuous Infusions:  PRN Meds:.  Not on File  Patient Active Problem List   Diagnosis SNOMED CT(R)   . Closed nondisplaced fracture of distal phalanx of finger with routine healing CLOSED FRACTURE OF DISTAL PHALANX OF FINGER       Assessment/Plan: (Include Medical Decision Making)  Cleared regular duty, does not need note, only  missed one day.      There are no diagnoses linked to this encounter.    Total time spent on medical discussion with patient: 20 minutes.

## 2019-11-10 ENCOUNTER — Ambulatory Visit: Payer: Medicare PPO | Admitting: Orthopedic Surgery

## 2019-11-10 ENCOUNTER — Ambulatory Visit: Payer: Medicare PPO

## 2019-11-10 ENCOUNTER — Other Ambulatory Visit: Payer: Self-pay

## 2019-11-10 VITALS — BP 139/79 | HR 63 | Temp 98.1°F | Ht 59.0 in | Wt 211.0 lb

## 2019-11-10 DIAGNOSIS — M1711 Unilateral primary osteoarthritis, right knee: Secondary | ICD-10-CM | POA: Diagnosis not present

## 2019-11-10 DIAGNOSIS — M7051 Other bursitis of knee, right knee: Secondary | ICD-10-CM | POA: Diagnosis not present

## 2019-11-10 DIAGNOSIS — M23321 Other meniscus derangements, posterior horn of medial meniscus, right knee: Secondary | ICD-10-CM

## 2019-11-10 DIAGNOSIS — M7052 Other bursitis of knee, left knee: Secondary | ICD-10-CM

## 2019-11-10 DIAGNOSIS — M1710 Unilateral primary osteoarthritis, unspecified knee: Secondary | ICD-10-CM

## 2019-11-10 MED ORDER — TRAMADOL-ACETAMINOPHEN 37.5-325 MG PO TABS: 1.0000 | ORAL_TABLET | ORAL | 5 refills | Status: DC | PRN

## 2019-11-10 NOTE — Progress Notes (Signed)
Chief Complaint  Patient presents with  . Follow-up    Recheck on right knee, DOS 05-14-18.   BP 139/79   Pulse 63   Temp 98.1 F (36.7 C)   Ht 4\' 11"  (1.499 m)   Wt 211 lb (95.7 kg)   BMI 42.62 kg/m   Samantha Larsen is status post knee arthroscopy in 2019 she has had persistent pain in the right knee.  She is trying to lose some weight and got down to between 210 and 214.  She takes Aleve now for pain  She basically has difficulties with her activities of daily living as she is caring for grandchildren.  She has trouble walking bending and squatting.  Review of systems denies chest pain or shortness of breath there is no numbness or tingling in the right lower extremity  Past Medical History:  Diagnosis Date  . Allergic rhinitis, unspecified   . Arthritis   . Asthma   . Essential (primary) hypertension   . Gastro-esophageal reflux disease with esophagitis   . GERD (gastroesophageal reflux disease)   . Hypercholesteremia   . Hyperlipidemia   . Hypertension   . Obesity, unspecified   . Pain in unspecified knee   . Plantar fascial fibromatosis   . Unspecified osteoarthritis, unspecified site     BP 139/79   Pulse 63   Temp 98.1 F (36.7 C)   Ht 4\' 11"  (1.499 m)   Wt 211 lb (95.7 kg)   BMI 42.62 kg/m   She is awake alert and oriented x3 mood and affect are normal general appearance is normal BMI is noted  No assistive devices needed for walking  Right knee is tender over the medial joint line she has a mild varus deformity.  No joint effusion is noted.  Range of motion120 degrees.  Muscle tone normal.  Neurovascular exam intact  Encounter Diagnosis  Name Primary?  Marland Kitchen Arthritis of knee Yes    Chronic knee pain with exacerbation  Start Ultracet continue Aleve Meds ordered this encounter  Medications  . traMADol-acetaminophen (ULTRACET) 37.5-325 MG tablet    Sig: Take 1 tablet by mouth every 4 (four) hours as needed for moderate pain.    Dispense:  28 tablet   Refill:  5

## 2019-11-10 NOTE — Patient Instructions (Signed)
Restart ultracet   Lose some more weight

## 2019-11-22 ENCOUNTER — Ambulatory Visit: Payer: BC Managed Care – PPO | Admitting: Family Medicine

## 2020-01-15 ENCOUNTER — Inpatient Hospital Stay
Admit: 2020-01-15 | Discharge: 2020-01-15 | Disposition: A | Discharge status: Home / Self Care | Payer: PRIVATE HEALTH INSURANCE | Attending: Family

## 2020-01-15 DIAGNOSIS — M79642 Pain in left hand: Secondary | ICD-10-CM

## 2020-01-15 NOTE — ED Triage Notes (Signed)
Brought in by private vehicle. C/o stuck ring. Pt has a  Gold ring stuck on her left ring finger, finger is swollen after tripping in gravel yesterday and using a jackhammer. Cms intact.

## 2020-01-15 NOTE — ED Provider Notes (Signed)
[facility] EMERGENCY DEPARTMENT ENCOUNTER    History and Physical     Name: Alazia Crocket  DOB: 1956/06/26 64 y.o.  MRN: [id number]  CSN: [id number]    History:     CHIEF COMPLAINT    Chief Complaint   Patient presents with   . Hand Pain       HPI    Reatha Sur is a 64 y.o. female presents to the emergency department with swelling to her left ring finger associated with the ring, as well as some minor trauma that was exacerbated by the use of jackhammer the day prior.  The patient had increased swelling of her finger overnight, with some discomfort.  She states the initial injury was very minimal trauma she thinks he may have broken a blood vessel.  Mild aching pain.  She has not taking anything for the discomfort  REVIEW OF SYSTEMS    5 point review of systems negative except for HPI    PAST MEDICAL HISTORY    No past medical history on file.    SURGICAL HISTORY    No past surgical history on file.    CURRENT MEDICATIONS    Prior to Admission medications    Medication Sig Start Date End Date Taking? Authorizing Provider   amoxicillin (AMOXIL) 500 MG capsule Take 1 capsule by mouth 3 times daily until gone. 10/11/19      Bi-est 50/50 progesterone / DHEA capsule 1.25/125/10 mg Take by mouth. 08/18/19      finasteride (PROPECIA) 1 mg tablet Take 1 tablet by mouth every 3 days. 07/06/19          ALLERGIES    Patient has no known allergies.    FAMILY HISTORY    No family history on file.    SOCIAL HISTORY    Social History     Tobacco Use   . Smoking status: Not on file   Substance Use Topics   . Alcohol use: Not on file   . Drug use: Not on file     No existing history information found.    Physical Exam:   VITAL SIGNS:    Initial Vitals [01/15/20 0653]   BP (!) 171/75   Pulse 77   Resp 16   Temp 37 ?C (98.6 ?F)   SpO2 100 %     Constitutional: Alert.  Nontoxic. Respiratory:  No respiratory distress, normal breath sounds.   Cardiovascular:  Normal rate, normal rhythm, no murmurs, no gallops, no rubs      Musculoskeletal: Mild palpable pain and swelling to the proximal interphalangeal joint left ring finger  Integument: Swelling and discoloration to the left ring finger  Neurologic: Intact sensation distally left ring finger and left hand    Data:       Plan:     ED COURSE & MEDICAL DECISION MAKING    64 year old female presents to the emergency department setting of a ring having become stuck on her left finger with concurrent swelling.  The ring was removed prior to my evaluation, the patient appears to be neurovascularly intact on this left ring finger.  We discussed obtaining x-ray, but there is very mild trauma, and the patient would rather not proceed with x-ray at this time I feel this is reasonable.  She will use over-the-counter analgesics.  She will ice as needed and elevate    FINAL IMPRESSION    1.  Ring removal left ring finger      This document was  created with the assistance of voice-to-text technology. Effort has been made to minimize transcription errors. Please allow for homonyms and other similar transcription errors.       Youlanda Mighty, FNP  01/15/20 [phone]

## 2020-01-15 NOTE — Discharge Instructions (Signed)
Patient Education     Musculoskeletal Pain  Musculoskeletal pain refers to aches and pains in your bones, joints, muscles, and the tissues that surround them. This pain can occur in any part of the body. It can last for a short time (acute) or a long time (chronic).  A physical exam, lab tests, and imaging studies may be done to find the cause of your musculoskeletal pain.  Follow these instructions at home:    Lifestyle  ? Try to control or lower your stress levels. Stress increases muscle tension and can worsen musculoskeletal pain. It is important to recognize when you are anxious or stressed and learn ways to manage it. This may include:  ? Meditation or yoga.  ? Cognitive or behavioral therapy.  ? Acupuncture or massage therapy.  ? You may continue all activities unless the activities cause more pain. When the pain gets better, slowly resume your normal activities. Gradually increase the intensity and duration of your activities or exercise.  Managing pain, stiffness, and swelling  ? Take over-the-counter and prescription medicines only as told by your health care provider.  ? When your pain is severe, bed rest may be helpful. Lie or sit in any position that is comfortable, but get out of bed and walk around at least every couple of hours.  ? If directed, apply heat to the affected area as often as told by your health care provider. Use the heat source that your health care provider recommends, such as a moist heat pack or a heating pad.  ? Place a towel between your skin and the heat source.  ? Leave the heat on for 20-30 minutes.  ? Remove the heat if your skin turns bright red. This is especially important if you are unable to feel pain, heat, or cold. You may have a greater risk of getting burned.  ? If directed, put ice on the painful area.  ? Put ice in a plastic bag.  ? Place a towel between your skin and the bag.  ? Leave the ice on for 20 minutes, 2-3 times a day.  General instructions  ? Your health  care provider may recommend that you see a physical therapist. This person can help you come up with a safe exercise program. Do any exercises as told by your physical therapist.  ? Keep all follow-up visits, including any physical therapy visits, as told by your health care providers. This is important.  Contact a health care provider if:  ? Your pain gets worse.  ? Medicines do not help ease your pain.  ? You cannot use the part of your body that hurts, such as your arm, leg, or neck.  ? You have trouble sleeping.  ? You have trouble doing your normal activities.  Get help right away if:  ? You have a new injury and your pain is worse or different.  ? You feel numb or you have tingling in the painful area.  Summary  ? Musculoskeletal pain refers to aches and pains in your bones, joints, muscles, and the tissues that surround them.  ? This pain can occur in any part of the body.  ? Your health care provider may recommend that you see a physical therapist. This person can help you come up with a safe exercise program. Do any exercises as told by your physical therapist.  ? Lower your stress level. Stress can worsen musculoskeletal pain. Ways to lower stress may include meditation, yoga, cognitive   or behavioral therapy, acupuncture, and massage therapy.  This information is not intended to replace advice given to you by your health care provider. Make sure you discuss any questions you have with your health care provider.  Document Released: 08/12/2005 Document Revised: 09/11/2016 Document Reviewed: 09/11/2016  Elsevier Interactive Patient Education ? 2019 [employer]

## 2020-01-15 NOTE — ED Notes (Signed)
ED tech cut gold ring of left middle finger. Cms intact

## 2020-01-17 MED ORDER — finasteride (PROPECIA) 1 mg tablet
1 | ORAL_TABLET | ORAL | 0 refills | Status: DC
Start: 2020-01-17 — End: 2020-04-19
  Filled 2020-01-20: qty 30, 90d supply, fill #0

## 2020-04-19 MED ORDER — finasteride (PROPECIA) 1 mg tablet
1 | ORAL_TABLET | ORAL | 1 refills | Status: AC
Start: 2020-04-19 — End: ?
  Filled 2020-04-20: qty 30, 90d supply, fill #0

## 2020-07-25 MED FILL — FINASTERIDE 1 MG TABLET: 1 mg | ORAL | 90 days supply | Qty: 30 | Fill #1

## 2020-10-23 MED ORDER — finasteride (PROPECIA) 1 mg tablet
1 | ORAL_TABLET | ORAL | 5 refills | Status: AC
Start: 2020-10-23 — End: ?
  Filled 2020-10-24: qty 30, 90d supply, fill #0

## 2021-01-29 MED FILL — FINASTERIDE 1 MG TABLET: 1 mg | ORAL | 90 days supply | Qty: 30 | Fill #1

## 2021-02-16 ENCOUNTER — Emergency Department: Admit: 2021-02-17 | Payer: PRIVATE HEALTH INSURANCE | Primary: MD

## 2021-02-16 DIAGNOSIS — S62617A Displaced fracture of proximal phalanx of left little finger, initial encounter for closed fracture: Secondary | ICD-10-CM

## 2021-02-16 NOTE — ED Triage Notes (Signed)
Pt c/o L hand pain, states she tripped and fell, CMS intact, ice pack applied.

## 2021-02-17 ENCOUNTER — Inpatient Hospital Stay
Admit: 2021-02-17 | Discharge: 2021-02-17 | Disposition: A | Discharge status: Home / Self Care | Payer: PRIVATE HEALTH INSURANCE | Attending: MD

## 2021-02-17 ENCOUNTER — Emergency Department: Admit: 2021-02-17 | Payer: PRIVATE HEALTH INSURANCE | Primary: MD

## 2021-02-17 NOTE — ED Notes (Signed)
Left message for [facility] for consult

## 2021-02-17 NOTE — ED Notes (Signed)
Splint placed by ED tech. CSM intact. Pt instructed to f/u with ortho and to return to the ED for any problem. Pt agreeable to plan and ambulatory out of the department with husband.

## 2021-02-17 NOTE — ED Notes (Signed)
Nancy Richard is speaking to provider

## 2021-02-17 NOTE — ED Provider Notes (Signed)
History   Nancy Richard is a 65 y.o. year old female presenting with Hand Injury  .    HPI     65 year old female presents to the emergency department with the chief complaint of an injury to the left hand.  The patient states that she was rushing to get a bucket to help her husband as they were both working out in the garden when she accidentally tripped over a small stone wall.  She reached out with both her hands to catch herself as she fell forward and she hyperextended the fingers of the left hand as she went down.  She states that all fingers were hyperextended pretty extensively.  She states that one of the fingers she had to actually forcefully put back into place, she cannot recall which one.  She states that she had worse pain when she first came in, her pain has decreased now that swelling has begun to set in.  She now has quite a bit of swelling throughout the hand, more so on the ulnar aspect and she has developed some bruising on the dorsal and volar aspect of the hand.  The patient states that she is left-hand dominant.  She states that she has no associated loss of sensation in her fingers or hand.  She had not taken anything for analgesia prior to arrival.    History reviewed. No pertinent past medical history.    History reviewed. No pertinent surgical history.    History reviewed. No pertinent family history.    Social History     Tobacco Use   . Smoking status: Never Smoker   . Smokeless tobacco: Never Used   Substance Use Topics   . Alcohol use: Not Currently     Other Social History Comments:       Home Medications     Med List Status: Initial Review In Progress Set By: Johnsie Kindred, RN at 02/16/2021  7:29 PM            Last Dose     amoxicillin (AMOXIL) 500 MG capsule Unknown     Take 1 capsule by mouth 3 times daily until gone.     Bi-est 50/50 progesterone / DHEA capsule 1.25/125/10 mg      Take by mouth.     finasteride (PROPECIA) 1 mg tablet      Take 1 tablet by mouth every  third day.     finasteride (PROPECIA) 1 mg tablet      Take 1 tablet by mouth every 3 days.          Review of Systems   Constitutional: Positive for activity change. Negative for appetite change and fever.   Skin: Positive for color change. Negative for wound.   Neurological: Negative.  Negative for dizziness, numbness and headaches.   All other systems reviewed and are negative.      Physical Exam   BP 132/65 (BP Location: Right arm, Patient Position: Sitting)   Pulse 77   Temp 36.5 ?C (97.7 ?F) (Oral)   Resp 18   SpO2 97%     Physical Exam  Vitals and nursing note reviewed.   Constitutional:       General: She is not in acute distress.     Appearance: She is not ill-appearing.   Eyes:      Extraocular Movements: Extraocular movements intact.      Pupils: Pupils are equal, round, and reactive to light.   Cardiovascular:  Rate and Rhythm: Normal rate and regular rhythm.      Pulses: Normal pulses.   Pulmonary:      Effort: Pulmonary effort is normal. No respiratory distress.   Musculoskeletal:         General: Swelling, tenderness and signs of injury present.      Cervical back: Normal range of motion and neck supple. No tenderness.      Comments: Inspection the patient's left hand demonstrates fairly significant swelling on the dorsal/ulnar aspect of the hand, the swelling gradually decreases moving towards the radial aspect.  There is discoloration/bruising associated with the swelling, there is marked bruising on the volar aspect of the hand, mid palm area.  The patient seems to have most tenderness around the pinky/MCP.  The patient is able to flex and extend the MCP joints, range of motion seems mostly limited at the pinky but she is still able to perform the action.  The PIP and DIP joints of all fingers seem to be intact and functional.  The patient endorses normal sensation to light palpation throughout the hand.  She has brisk capillary refill, symmetric radial and ulnar pulses.  She has no  associated open wounds   Skin:     General: Skin is warm and dry.   Neurological:      General: No focal deficit present.      Mental Status: She is alert.         ED Course   No results found for this visit on 02/17/21 (from the past 24 hour(s)).    X-ray hand left PA lateral and oblique    (Results Pending)   X-ray finger left minimum 2 views    (Results Pending)       MEDICATIONS ADMINISTERED-LAST 24 Hours  (last 24 hrs)         ** No medications to display **          Procedures    MDM    The patient presents to the emergency department in no acute distress by the time I evaluate her.  Unfortunately she has been in the waiting room a very long time.  She has not been provided any analgesic medications.  She is declining them at this time.  She is still in very good humor and quite pleasant.  The patient's left hand is neurovascularly intact.  She denies any other injuries, the injury seem to be isolated to the left hand.  She describes basically hyperextension of all fingers at what seems to be the MCP joints, however the primary injury seems to be at the left pinky MCP joint.  The x-ray of the hand demonstrates what appears to be a proximal phalanx fracture of the left pinky, I did obtain a dedicated x-ray of the digit for a closer look.  This confirms a slightly displaced fracture of the left pinky.  I discussed the case with [name], physician assistant for Dr. Christy Gentles, orthopedic physician on-call, who recommends that the patient perhaps undergo buddy taping, however an ulnar gutter splint would also be a reasonable alternative.  He had me do a bedside test to see if there is any rotational abnormality with the pinky, I am not particularly experience at this physical exam, it does appear slightly rotationally displaced.  I did elect to place the patient in the ulnar gutter splint to err on the side of safety.  Mr. Damian Leavell does state that there is a possibility that the patient will  require a  surgical intervention.  They will follow the patient in the clinic to ensure appropriate care.  The patient is encouraged to return to [place] for increasing pain, significant discoloration of her fingers.  The patient remains neurovascularly intact after being placed in the splint.  She is declining narcotic pain medications at this time, she is encouraged to use acetaminophen and ibuprofen for pain as needed and to keep the extremity elevated.  She is stable for discharge.    ED Disposition: Discharge    Diagnoses that have been ruled out:   None   Diagnoses that are still under consideration:   None   Final diagnoses:   Displaced fracture of proximal phalanx of left little finger, initial encounter for closed fracture             Esmeralda Arthur, MD  02/17/21 0221

## 2021-02-22 ENCOUNTER — Encounter: Payer: PRIVATE HEALTH INSURANCE | Attending: PA-C | Primary: MD

## 2021-02-23 ENCOUNTER — Ambulatory Visit: Payer: Self-pay | Admitting: Orthopedic Surgery

## 2021-03-01 ENCOUNTER — Other Ambulatory Visit (HOSPITAL_COMMUNITY): Payer: Self-pay

## 2021-03-01 NOTE — Telephone Encounter (Signed)
Patients husband Nancy Richard called in requesting to get patient in sooner as patients left hand has a temporary cast over 1 week ago.   Patient was supposed to get in right away and was not able to.   Patient is having medical problems with the left hand and is requesting to get in sooner if possible.   Dr.Beeson who saw patient in the ER wanted patient to follow up 1 week after she was seen in the ER 02/17/21.  Please call patients husband Nancy Richard back at [phone]. Okay to leave a detailed message.

## 2021-03-01 NOTE — Progress Notes (Signed)
DUE TO COVID-19 ONLY ONE VISITOR IS ALLOWED TO COME WITH YOU AND STAY IN THE WAITING ROOM ONLY DURING PRE OP AND PROCEDURE DAY OF SURGERY. THE 1 VISITOR  MAY VISIT WITH YOU AFTER SURGERY IN YOUR PRIVATE ROOM DURING VISITING HOURS ONLY!  YOU NEED TO HAVE A COVID 19 TEST ON__7/18/2022 _____ @_______ , THIS TEST MUST BE DONE BEFORE SURGERY,  COVID TESTING SITE [address] [place] [id number], IT IS ON THE RIGHT GOING OUT [address] APPROXIMATELY  2 MINUTES PAST [employer] ON THE RIGHT. ONCE YOUR COVID TEST IS COMPLETED,  PLEASE BEGIN THE QUARANTINE INSTRUCTIONS AS OUTLINED IN YOUR HANDOUT.                Samantha Larsen  03/01/2021   Your procedure is scheduled on:                 03/14/2021   Report to [facility] Main  Entrance   Report to admitting at    0630am AM     Call this number if you have problems the morning of surgery [phone]    REMEMBER: NO  SOLID FOOD CANDY OR GUM AFTER MIDNIGHT. CLEAR LIQUIDS UNTIL 0530am         . NOTHING BY MOUTH EXCEPT CLEAR LIQUIDS UNTIL  0530am  . PLEASE FINISH ENSURE DRINK PER SURGEON ORDER  WHICH NEEDS TO BE COMPLETED AT   0530am    .      CLEAR LIQUID DIET   Foods Allowed                                                                    Coffee and tea, regular and decaf                            Fruit ices (not with fruit pulp)                                      Iced Popsicles                                    Carbonated beverages, regular and diet                                    Cranberry, grape and apple juices Sports drinks like Gatorade Lightly seasoned clear broth or consume(fat free) Sugar, honey syrup ___________________________________________________________________      BRUSH YOUR TEETH MORNING OF SURGERY AND RINSE YOUR MOUTH OUT, NO CHEWING GUM CANDY OR MINTS.     Take these medicines the morning of surgery with A SIP OF WATER: nadolol, prevacid, inhalers a susual and bring, flonase    DO NOT TAKE ANY DIABETIC MEDICATIONS DAY OF YOUR SURGERY                               You may not have any metal on your  body including hair pins and              piercings  Do not wear jewelry, make-up, lotions, powders or perfumes, deodorant             Do not wear nail polish on your fingernails.  Do not shave  48 hours prior to surgery.              Men may shave face and neck.   Do not bring valuables to the hospital. [place].  Contacts, dentures or bridgework may not be worn into surgery.  Leave suitcase in the car. After surgery it may be brought to your room.     Patients discharged the day of surgery will not be allowed to drive home. IF YOU ARE HAVING SURGERY AND GOING HOME THE SAME DAY, YOU MUST HAVE AN ADULT TO DRIVE YOU HOME AND BE WITH YOU FOR 24 HOURS. YOU MAY GO HOME BY TAXI OR UBER OR ORTHERWISE, BUT AN ADULT MUST ACCOMPANY YOU HOME AND STAY WITH YOU FOR 24 HOURS.  Name and phone number of your driver:  Special Instructions: N/A              Please read over the following fact sheets you were given: _____________________________________________________________________  [facility] - Preparing for Surgery Before surgery, you can play an important role.  Because skin is not sterile, your skin needs to be as free of germs as possible.  You can reduce the number of germs on your skin by washing with CHG (chlorahexidine gluconate) soap before surgery.  CHG is an antiseptic cleaner which kills germs and bonds with the skin to continue killing germs even after washing. Please DO NOT use if you have an allergy to CHG or antibacterial soaps.  If your skin becomes reddened/irritated stop using the CHG and inform your nurse when you arrive at Short Stay. Do not shave (including legs and underarms) for at least 48 hours prior to the first CHG shower.  You may shave your face/neck. Please follow these instructions carefully:  1.   Shower with CHG Soap the night before surgery and the  morning of Surgery.  2.  If you choose to wash your hair, wash your hair first as usual with your  normal  shampoo.  3.  After you shampoo, rinse your hair and body thoroughly to remove the  shampoo.                           4.  Use CHG as you would any other liquid soap.  You can apply chg directly  to the skin and wash                       Gently with a scrungie or clean washcloth.  5.  Apply the CHG Soap to your body ONLY FROM THE NECK DOWN.   Do not use on face/ open                           Wound or open sores. Avoid contact with eyes, ears mouth and genitals (private parts).                       Wash face,  Development worker, international aid (private  parts) with your normal soap.             6.  Wash thoroughly, paying special attention to the area where your surgery  will be performed.  7.  Thoroughly rinse your body with warm water from the neck down.  8.  DO NOT shower/wash with your normal soap after using and rinsing off  the CHG Soap.                9.  Pat yourself dry with a clean towel.            10.  Wear clean pajamas.            11.  Place clean sheets on your bed the night of your first shower and do not  sleep with pets. Day of Surgery : Do not apply any lotions/deodorants the morning of surgery.  Please wear clean clothes to the hospital/surgery center.  FAILURE TO FOLLOW THESE INSTRUCTIONS MAY RESULT IN THE CANCELLATION OF YOUR SURGERY PATIENT SIGNATURE_________________________________  NURSE SIGNATURE__________________________________  ________________________________________________________________________

## 2021-03-06 ENCOUNTER — Encounter (HOSPITAL_COMMUNITY): Payer: Self-pay

## 2021-03-06 ENCOUNTER — Encounter (HOSPITAL_COMMUNITY)
Admission: RE | Admit: 2021-03-06 | Discharge: 2021-03-06 | Disposition: A | Discharge status: Home / Self Care | Payer: Medicare PPO | Source: Ambulatory Visit | Attending: Specialist | Admitting: Specialist

## 2021-03-06 ENCOUNTER — Other Ambulatory Visit: Payer: Self-pay

## 2021-03-06 DIAGNOSIS — Z01812 Encounter for preprocedural laboratory examination: Secondary | ICD-10-CM | POA: Diagnosis present

## 2021-03-06 LAB — URINALYSIS, ROUTINE W REFLEX MICROSCOPIC
Bilirubin Urine: NEGATIVE
Glucose, UA: NEGATIVE mg/dL
Hgb urine dipstick: NEGATIVE
Ketones, ur: NEGATIVE mg/dL
Nitrite: NEGATIVE
Protein, ur: NEGATIVE mg/dL
Specific Gravity, Urine: 1.026 (ref 1.005–1.030)
pH: 5 (ref 5.0–8.0)

## 2021-03-06 LAB — SURGICAL PCR SCREEN
MRSA, PCR: NEGATIVE
Staphylococcus aureus: NEGATIVE

## 2021-03-06 NOTE — Progress Notes (Addendum)
Anesthesia Review:  PCP:  DR Orie Fisherman  LOV 02/15/21 for preop eval with Vicenta Aly on 02/15/21  Cardiologist : none  Chest x-ray : EKG : 02/15/21  Requested by FAx EKG done on 02/15/21.   Echo : Stress test: Cardiac Cath :  Activity level: can do a flight of stairs without difficuolty  Sleep Study/ CPAP : none  Fasting Blood Sugar :      / Checks Blood Sugar -- times a day:   Blood Thinner/ Instructions /Last Dose: ASA / Instructions/ Last Dose :   CBC/DIFF and CMP done 02/15/21- on chart.  U/a done 03/06/21 routed to DR Beane.

## 2021-03-08 ENCOUNTER — Ambulatory Visit: Admit: 2021-03-08 | Discharge: 2021-03-12 | Payer: PRIVATE HEALTH INSURANCE | Attending: PA-C | Primary: MD

## 2021-03-08 ENCOUNTER — Ambulatory Visit: Payer: Self-pay | Admitting: Orthopedic Surgery

## 2021-03-08 DIAGNOSIS — S62667D Nondisplaced fracture of distal phalanx of left little finger, subsequent encounter for fracture with routine healing: Secondary | ICD-10-CM

## 2021-03-08 NOTE — Progress Notes (Signed)
Nancy Richard is a 65 y.o. female.     Chief Complaint   Patient presents with   . Hand Injury     Left little finger fracture     HISTORY OF PRESENT ILLNESS     Nancy Richard is a 65 y.o. female, presents today for further evaluation of her left little finger fracture.  Her initial injury occurred after a ground level fall in her garden. She tripped over a small stone and fell on outstretched hands.  She states that her fingers folded backwards.  She had some fairly significant pain and was ultimately seen in the emergency department.  The date of initial injury was 02/17/21.      The patient was seen in the ED.  The visit dated 02/17/21 was reviewed at length.  Xrays taken at that appointment were also reviewed at length.  She was found to have a fracture of the proximal phalanx of the little finger.  The patient was placed into a splint and referred to orthopedics.    She has since removed the splint to start working on range of motion.    No documented history of DVT or PE.    Diabetic history: No history of diabetes.    Smoking history: She is a non-smoker.    History reviewed. No pertinent past medical history.    History reviewed. No pertinent family history.    History reviewed. No pertinent surgical history.    Allergies   Allergen Reactions   . Codeine Anxiety     Social History     Socioeconomic History   . Marital status: Married   Tobacco Use   . Smoking status: Never Smoker   . Smokeless tobacco: Never Used   Substance and Sexual Activity   . Alcohol use: Not Currently     REVIEW OF SYSTEMS     Review of Systems   Constitutional: Positive for activity change.   All other systems reviewed and are negative.     10 point review of systems negative except as noted above.    PHYSICAL EXAM     Ht 5' 4 (1.626 m)   Wt 178 lb (80.7 kg)   BMI 30.55 kg/m?   Body mass index is 30.55 kg/m?Marland Kitchen    Physical Exam  Vitals reviewed.   Musculoskeletal:      Left hand: Swelling (Mild swelling noted through the  left hand.) and bony tenderness present. No deformity or lacerations. Decreased range of motion (She is able to fully extend the left little finger.  Her limitation is with regards to flexion.). Normal pulse.      Comments:   -Her distal neurovascular exam is grossly intact.         IMAGING STUDY     Results for orders placed in visit on 03/08/21    XR HAND LEFT PA LATERAL AND OBLIQUE  03/08/2021    Narrative  Left hand, plain film radiographs. Multiple views were obtained.  The fracture about the left little finger, proximal phalanx remains in similar alignment to previous x-rays.  Minimal displacement without significant angulation.  Joint spaces remain open.  No other acute, bony abnormality was appreciated.    I independently reviewed images today and discussed the findings with the patient.    ASSESSMENT        SNOMED CT(R)    1. Closed nondisplaced fracture of distal phalanx of left little finger with routine healing, subsequent encounter  CLOSED FRACTURE OF DISTAL  PHALANX OF LITTLE FINGER [place] [facility]   2. Left hand pain  PAIN OF LEFT HAND X-ray hand left PA lateral and oblique        PLAN        I recommend conservative treatment.  The patient has been out of the splint for a while.  She can remain out of the splint.  I would like her to participate in some formal physical therapy.  The appropriate referrals will be placed today.  She will follow-up in the clinic in approximately 4 to 6 weeks for further evaluation.  If she continues to improve I would plan to release her to an as-needed basis.      The patient agrees with this plan.    All questions were solicited and answered.  I would be happy to see Nancy Richard back at any time with further questions or concerns.       [name], PA-C    *This note was dictated using voice recognition software. If you have any questions concerning the content, please call our office at [phone].*

## 2021-03-08 NOTE — H&P (Signed)
Samantha Larsen is an 65 y.o. female.   Chief Complaint: right knee pain HPI: The patient comes in for her H&P. She is scheduled for a right TKA by Dr. Tonita Cong on 03/14/21 at [facility].  Dr. Tonita Cong and the patient mutually agreed to proceed with a total knee replacement. Risks and benefits of the procedure were discussed including stiffness, suboptimal range of motion, persistent pain, infection requiring removal of prosthesis and reinsertion, need for prophylactic antibiotics in the future, for example, dental procedures, possible need for manipulation, revision in the future and also anesthetic complications including DVT, PE, etc. We discussed the perioperative course, time in the hospital, postoperative recovery and the need for elevation to control swelling. We also discussed the predicted range of motion and the probability that squatting and kneeling would be unobtainable in the future. In addition, postoperative anticoagulation was discussed. We have obtained preoperative medical clearance as necessary. Provided illustrated handout and discussed it in detail. They will enroll in the total joint replacement educational forum at the hospital. She has been medically cleared by her PCP.  She reports a new right-sided hip pain, groin pain and back pain over the last few weeks. She is worried that this will interfere with surgery. Her back feels somewhat better, but she is having ongoing hip and groin pain.  Past Medical History:  Diagnosis Date   Allergic rhinitis, unspecified    Arthritis    Asthma    Essential (primary) hypertension    Gastro-esophageal reflux disease with esophagitis    GERD (gastroesophageal reflux disease)    Hypercholesteremia    Hyperlipidemia    Hypertension    Obesity, unspecified    Pain in unspecified knee    Plantar fascial fibromatosis    Unspecified osteoarthritis, unspecified site     Past Surgical History:  Procedure Laterality Date   ABDOMINAL  HYSTERECTOMY     COLONOSCOPY N/A 05/18/2014   Procedure: COLONOSCOPY;  Surgeon: Rogene Houston, MD;  Location: [facility];  Service: Endoscopy;  Laterality: N/A;  1200   COLONOSCOPY N/A 08/25/2019   Procedure: COLONOSCOPY;  Surgeon: Rogene Houston, MD;  Location: [facility];  Service: Endoscopy;  Laterality: N/A;  12   DILATION AND CURETTAGE OF UTERUS     x 2   KNEE ARTHROSCOPY WITH MEDIAL MENISECTOMY Right 05/14/2018   Procedure: RIGHT KNEE ARTHROSCOPY WITH PARTIAL MEDIAL AND LATERAL MENISCECTOMY;  Surgeon: Carole Civil, MD;  Location: [facility];  Service: Orthopedics;  Laterality: Right;   lasik     POLYPECTOMY  08/25/2019   Procedure: POLYPECTOMY;  Surgeon: Rogene Houston, MD;  Location: [facility];  Service: Endoscopy;;   TUBAL LIGATION      Family History  Problem Relation Age of Onset   Other Mother    Dementia Father    Heart murmur Father    High blood pressure Father    Arthritis Father    High blood pressure Sister    Arthritis Sister    Colon cancer Paternal Aunt    Social History:  reports that she has never smoked. She has never used smokeless tobacco. She reports that she does not drink alcohol and does not use drugs.  Allergies:  Allergies  Allergen Reactions   Ibuprofen Swelling    Welping - in large doses   Penicillins Hives and Swelling    Has patient had a PCN reaction causing immediate rash, facial/tongue/throat swelling, SOB or lightheadedness with hypotension: YES Has patient had  a PCN reaction causing severe rash involving mucus membranes or skin necrosis: NO Has patient had a PCN reaction that required hospitalization: No Has patient had a PCN reaction occurring within the last 10 years: No If all of the above answers are "NO", then may proceed with Cephalosporin use.    Wound Dressing Adhesive     Blisters - Bandaids    (Not in a hospital admission)   No results found for this or any previous visit (from the past 48 hour(s)). No  results found.  Review of Systems  Constitutional: Negative.   HENT: Negative.    Eyes: Negative.   Respiratory: Negative.    Cardiovascular: Negative.   Gastrointestinal: Negative.   Endocrine: Negative.   Genitourinary: Negative.   Musculoskeletal:  Positive for arthralgias, gait problem, joint swelling and myalgias.  Skin: Negative.   Allergic/Immunologic: Negative.    There were no vitals taken for this visit. Physical Exam Constitutional:      Appearance: Normal appearance.  HENT:     Head: Normocephalic.     Right Ear: External ear normal.     Left Ear: External ear normal.     Nose: Nose normal.     Mouth/Throat:     Pharynx: Oropharynx is clear.  Eyes:     Conjunctiva/sclera: Conjunctivae normal.  Cardiovascular:     Rate and Rhythm: Normal rate and regular rhythm.     Pulses: Normal pulses.  Pulmonary:     Effort: Pulmonary effort is normal.  Abdominal:     General: Bowel sounds are normal.  Musculoskeletal:     Cervical back: Normal range of motion.     Comments: Examination she walks with an antalgic gait with the assistance of a walker. There is increased adipose tissue in her thigh. Mild to moderate effusion of the knee. She is tender to medial joint line. Patellofemoral pain compression. Ranges -5-1 10. Tender right buttock, nontender over the greater trochanter of the hip. Full range of motion with internal and external rotation of the right hip without pain. 2 view right hip x-rays ordered, obtained, reviewed today with no fracture, subluxation, dislocation, lytic or blastic lesions. There are some degenerative changes noted in the lumbar spine.  Skin:    General: Skin is warm and dry.  Neurological:     Mental Status: She is alert.     Assessment/Plan Impression: Right knee end-stage osteoarthritis.  Also with some current right hip pain which may be myofascial in nature versus an exacerbation of some lumbar spondylosis but does not seem to be  intrinsic to the hip on exam  Plan: Pt with end-stage right knee DJD, bone-on-bone, refractory to conservative tx, scheduled for right total knee replacement by Dr. Tonita Cong on July 20. We again discussed the procedure itself as well as risks, complications and alternatives, including but not limited to DVT, PE, infx, bleeding, failure of procedure, need for secondary procedure including manipulation, nerve injury, ongoing pain/symptoms, anesthesia risk, even stroke or death. Also discussed typical post-op protocols, activity restrictions, need for PT, flexion/extension exercises, time out of work. Discussed need for DVT ppx post-op per protocol. Discussed dental ppx and infx prevention. Also discussed limitations post-operatively such as kneeling and squatting. All questions were answered. Patient desires to proceed with surgery as scheduled.  Will hold supplements, ASA and NSAIDs accordingly. Will remain NPO after midnight the night before surgery. Will present to [facility] for pre-op testing. Anticipate hospital stay to include at least 2 midnights given medical history and  to ensure proper pain control. Plan aspirin for DVT ppx post-op. Plan oxycodone, Robaxin, Colace, Miralax. Plan home with HHPT post-op with family members at home for assistance then transition to outpatient PT when ready. Will follow up 10-14 days post-op for suture removal and xrays.  Plan Right total knee replacement  Cecilie Kicks, PA-C for Dr Tonita Cong 03/08/2021, 2:29 PM

## 2021-03-08 NOTE — H&P (View-Only) (Signed)
Samantha Larsen is an 65 y.o. female.   Chief Complaint: right knee pain HPI: The patient comes in for her H&P. She is scheduled for a right TKA by Dr. Tonita Cong on 03/14/21 at [facility].  Dr. Tonita Cong and the patient mutually agreed to proceed with a total knee replacement. Risks and benefits of the procedure were discussed including stiffness, suboptimal range of motion, persistent pain, infection requiring removal of prosthesis and reinsertion, need for prophylactic antibiotics in the future, for example, dental procedures, possible need for manipulation, revision in the future and also anesthetic complications including DVT, PE, etc. We discussed the perioperative course, time in the hospital, postoperative recovery and the need for elevation to control swelling. We also discussed the predicted range of motion and the probability that squatting and kneeling would be unobtainable in the future. In addition, postoperative anticoagulation was discussed. We have obtained preoperative medical clearance as necessary. Provided illustrated handout and discussed it in detail. They will enroll in the total joint replacement educational forum at the hospital. She has been medically cleared by her PCP.  She reports a new right-sided hip pain, groin pain and back pain over the last few weeks. She is worried that this will interfere with surgery. Her back feels somewhat better, but she is having ongoing hip and groin pain.  Past Medical History:  Diagnosis Date   Allergic rhinitis, unspecified    Arthritis    Asthma    Essential (primary) hypertension    Gastro-esophageal reflux disease with esophagitis    GERD (gastroesophageal reflux disease)    Hypercholesteremia    Hyperlipidemia    Hypertension    Obesity, unspecified    Pain in unspecified knee    Plantar fascial fibromatosis    Unspecified osteoarthritis, unspecified site     Past Surgical History:  Procedure Laterality Date   ABDOMINAL  HYSTERECTOMY     COLONOSCOPY N/A 05/18/2014   Procedure: COLONOSCOPY;  Surgeon: Rogene Houston, MD;  [facility];  Service: Endoscopy;  Laterality: N/A;  1200   COLONOSCOPY N/A 08/25/2019   Procedure: COLONOSCOPY;  Surgeon: Rogene Houston, MD;  [facility];  Service: Endoscopy;  Laterality: N/A;  12   DILATION AND CURETTAGE OF UTERUS     x 2   KNEE ARTHROSCOPY WITH MEDIAL MENISECTOMY Right 05/14/2018   Procedure: RIGHT KNEE ARTHROSCOPY WITH PARTIAL MEDIAL AND LATERAL MENISCECTOMY;  Surgeon: Carole Civil, MD;  Location: [facility];  Service: Orthopedics;  Laterality: Right;   lasik     POLYPECTOMY  08/25/2019   Procedure: POLYPECTOMY;  Surgeon: Rogene Houston, MD;  [facility];  Service: Endoscopy;;   TUBAL LIGATION      Family History  Problem Relation Age of Onset   Other Mother    Dementia Father    Heart murmur Father    High blood pressure Father    Arthritis Father    High blood pressure Sister    Arthritis Sister    Colon cancer Paternal Aunt    Social History:  reports that she has never smoked. She has never used smokeless tobacco. She reports that she does not drink alcohol and does not use drugs.  Allergies:  Allergies  Allergen Reactions   Ibuprofen Swelling    Welping - in large doses   Penicillins Hives and Swelling    Has patient had a PCN reaction causing immediate rash, facial/tongue/throat swelling, SOB or lightheadedness with hypotension: YES Has patient had  a PCN reaction causing severe rash involving mucus membranes or skin necrosis: NO Has patient had a PCN reaction that required hospitalization: No Has patient had a PCN reaction occurring within the last 10 years: No If all of the above answers are "NO", then may proceed with Cephalosporin use.    Wound Dressing Adhesive     Blisters - Bandaids    (Not in a hospital admission)   No results found for this or any previous visit (from the past 48 hour(s)). No  results found.  Review of Systems  Constitutional: Negative.   HENT: Negative.    Eyes: Negative.   Respiratory: Negative.    Cardiovascular: Negative.   Gastrointestinal: Negative.   Endocrine: Negative.   Genitourinary: Negative.   Musculoskeletal:  Positive for arthralgias, gait problem, joint swelling and myalgias.  Skin: Negative.   Allergic/Immunologic: Negative.    There were no vitals taken for this visit. Physical Exam Constitutional:      Appearance: Normal appearance.  HENT:     Head: Normocephalic.     Right Ear: External ear normal.     Left Ear: External ear normal.     Nose: Nose normal.     Mouth/Throat:     Pharynx: Oropharynx is clear.  Eyes:     Conjunctiva/sclera: Conjunctivae normal.  Cardiovascular:     Rate and Rhythm: Normal rate and regular rhythm.     Pulses: Normal pulses.  Pulmonary:     Effort: Pulmonary effort is normal.  Abdominal:     General: Bowel sounds are normal.  Musculoskeletal:     Cervical back: Normal range of motion.     Comments: Examination she walks with an antalgic gait with the assistance of a walker. There is increased adipose tissue in her thigh. Mild to moderate effusion of the knee. She is tender to medial joint line. Patellofemoral pain compression. Ranges -5-1 10. Tender right buttock, nontender over the greater trochanter of the hip. Full range of motion with internal and external rotation of the right hip without pain. 2 view right hip x-rays ordered, obtained, reviewed today with no fracture, subluxation, dislocation, lytic or blastic lesions. There are some degenerative changes noted in the lumbar spine.  Skin:    General: Skin is warm and dry.  Neurological:     Mental Status: She is alert.     Assessment/Plan Impression: Right knee end-stage osteoarthritis.  Also with some current right hip pain which may be myofascial in nature versus an exacerbation of some lumbar spondylosis but does not seem to be  intrinsic to the hip on exam  Plan: Pt with end-stage right knee DJD, bone-on-bone, refractory to conservative tx, scheduled for right total knee replacement by Dr. Tonita Cong on July 20. We again discussed the procedure itself as well as risks, complications and alternatives, including but not limited to DVT, PE, infx, bleeding, failure of procedure, need for secondary procedure including manipulation, nerve injury, ongoing pain/symptoms, anesthesia risk, even stroke or death. Also discussed typical post-op protocols, activity restrictions, need for PT, flexion/extension exercises, time out of work. Discussed need for DVT ppx post-op per protocol. Discussed dental ppx and infx prevention. Also discussed limitations post-operatively such as kneeling and squatting. All questions were answered. Patient desires to proceed with surgery as scheduled.  Will hold supplements, ASA and NSAIDs accordingly. Will remain NPO after midnight the night before surgery. Will present to [facility] for pre-op testing. Anticipate hospital stay to include at least 2 midnights given medical history and  to ensure proper pain control. Plan aspirin for DVT ppx post-op. Plan oxycodone, Robaxin, Colace, Miralax. Plan home with HHPT post-op with family members at home for assistance then transition to outpatient PT when ready. Will follow up 10-14 days post-op for suture removal and xrays.  Plan Right total knee replacement  Cecilie Kicks, PA-C for Dr Tonita Cong 03/08/2021, 2:29 PM

## 2021-03-12 ENCOUNTER — Other Ambulatory Visit (HOSPITAL_COMMUNITY)
Admission: RE | Admit: 2021-03-12 | Discharge: 2021-03-12 | Disposition: A | Discharge status: Home / Self Care | Payer: Medicare PPO | Source: Ambulatory Visit | Attending: Specialist | Admitting: Specialist

## 2021-03-12 DIAGNOSIS — Z01812 Encounter for preprocedural laboratory examination: Secondary | ICD-10-CM | POA: Insufficient documentation

## 2021-03-12 DIAGNOSIS — Z20822 Contact with and (suspected) exposure to covid-19: Secondary | ICD-10-CM | POA: Insufficient documentation

## 2021-03-12 LAB — SARS CORONAVIRUS 2 (TAT 6-24 HRS): SARS Coronavirus 2: NEGATIVE

## 2021-03-13 NOTE — Anesthesia Preprocedure Evaluation (Addendum)
Anesthesia Evaluation  Patient identified by MRN, date of birth, ID band Patient awake    Reviewed: Allergy & Precautions, NPO status , Patient's Chart, lab work & pertinent test results  Airway Mallampati: II  TM Distance: >3 FB     Dental   Pulmonary asthma ,    breath sounds clear to auscultation       Cardiovascular hypertension,  Rhythm:Regular Rate:Normal     Neuro/Psych    GI/Hepatic Neg liver ROS, GERD  ,  Endo/Other  negative endocrine ROS  Renal/GU negative Renal ROS     Musculoskeletal   Abdominal   Peds  Hematology   Anesthesia Other Findings   Reproductive/Obstetrics                            Anesthesia Physical Anesthesia Plan  ASA: 3  Anesthesia Plan: General   Post-op Pain Management:  Regional for Post-op pain   Induction: Intravenous  PONV Risk Score and Plan: 3 and Ondansetron, Dexamethasone and Midazolam  Airway Management Planned: Oral ETT  Additional Equipment:   Intra-op Plan:   Post-operative Plan: Extubation in OR  Informed Consent: I have reviewed the patients History and Physical, chart, labs and discussed the procedure including the risks, benefits and alternatives for the proposed anesthesia with the patient or authorized representative who has indicated his/her understanding and acceptance.       Plan Discussed with: Anesthesiologist and CRNA  Anesthesia Plan Comments:        Anesthesia Quick Evaluation

## 2021-03-14 ENCOUNTER — Inpatient Hospital Stay (HOSPITAL_COMMUNITY): Payer: Medicare PPO | Admitting: Physician Assistant

## 2021-03-14 ENCOUNTER — Other Ambulatory Visit: Payer: Self-pay

## 2021-03-14 ENCOUNTER — Encounter (HOSPITAL_COMMUNITY)
Admission: RE | Disposition: A | Discharge status: Home / Self Care | Payer: Self-pay | Source: Home / Self Care | Attending: Specialist

## 2021-03-14 ENCOUNTER — Inpatient Hospital Stay (HOSPITAL_COMMUNITY): Payer: Medicare PPO | Admitting: Anesthesiology

## 2021-03-14 ENCOUNTER — Other Ambulatory Visit (HOSPITAL_COMMUNITY): Payer: Self-pay

## 2021-03-14 ENCOUNTER — Encounter (HOSPITAL_COMMUNITY): Payer: Self-pay | Admitting: Specialist

## 2021-03-14 ENCOUNTER — Inpatient Hospital Stay (HOSPITAL_COMMUNITY)
Admission: RE | Admit: 2021-03-14 | Discharge: 2021-03-15 | DRG: 470 | Disposition: A | Discharge status: Home Health | Payer: Medicare PPO | Attending: Specialist | Admitting: Specialist

## 2021-03-14 ENCOUNTER — Inpatient Hospital Stay (HOSPITAL_COMMUNITY): Payer: Medicare PPO

## 2021-03-14 DIAGNOSIS — Z6838 Body mass index (BMI) 38.0-38.9, adult: Secondary | ICD-10-CM | POA: Diagnosis not present

## 2021-03-14 DIAGNOSIS — K219 Gastro-esophageal reflux disease without esophagitis: Secondary | ICD-10-CM | POA: Diagnosis present

## 2021-03-14 DIAGNOSIS — Z91048 Other nonmedicinal substance allergy status: Secondary | ICD-10-CM | POA: Diagnosis not present

## 2021-03-14 DIAGNOSIS — Z886 Allergy status to analgesic agent status: Secondary | ICD-10-CM | POA: Diagnosis not present

## 2021-03-14 DIAGNOSIS — Z20822 Contact with and (suspected) exposure to covid-19: Secondary | ICD-10-CM | POA: Diagnosis present

## 2021-03-14 DIAGNOSIS — M1711 Unilateral primary osteoarthritis, right knee: Secondary | ICD-10-CM | POA: Diagnosis present

## 2021-03-14 DIAGNOSIS — J45909 Unspecified asthma, uncomplicated: Secondary | ICD-10-CM | POA: Diagnosis present

## 2021-03-14 DIAGNOSIS — M25551 Pain in right hip: Secondary | ICD-10-CM | POA: Diagnosis present

## 2021-03-14 DIAGNOSIS — I10 Essential (primary) hypertension: Secondary | ICD-10-CM | POA: Diagnosis present

## 2021-03-14 DIAGNOSIS — E669 Obesity, unspecified: Secondary | ICD-10-CM | POA: Diagnosis present

## 2021-03-14 DIAGNOSIS — E7800 Pure hypercholesterolemia, unspecified: Secondary | ICD-10-CM | POA: Diagnosis present

## 2021-03-14 DIAGNOSIS — Z96659 Presence of unspecified artificial knee joint: Secondary | ICD-10-CM

## 2021-03-14 DIAGNOSIS — Z880 Allergy status to penicillin: Secondary | ICD-10-CM

## 2021-03-14 DIAGNOSIS — Z8261 Family history of arthritis: Secondary | ICD-10-CM

## 2021-03-14 HISTORY — PX: TOTAL KNEE ARTHROPLASTY: SHX125

## 2021-03-14 SURGERY — ARTHROPLASTY, KNEE, TOTAL
Anesthesia: Spinal | Site: Knee | Laterality: Right

## 2021-03-14 MED ORDER — NADOLOL 40 MG PO TABS
40.0000 mg | ORAL_TABLET | Freq: Every day | ORAL | Status: DC
  Administered 2021-03-15: 40 mg via ORAL
  Filled 2021-03-14: qty 1

## 2021-03-14 MED ORDER — OXYCODONE HCL 5 MG PO TABS
ORAL_TABLET | ORAL | Status: AC
  Filled 2021-03-14: qty 1

## 2021-03-14 MED ORDER — FOLIC ACID 1 MG PO TABS
1000.0000 ug | ORAL_TABLET | Freq: Every day | ORAL | Status: DC
  Administered 2021-03-14: 1 mg via ORAL
  Filled 2021-03-14: qty 1

## 2021-03-14 MED ORDER — ALBUTEROL SULFATE (2.5 MG/3ML) 0.083% IN NEBU: 3.0000 mL | INHALATION_SOLUTION | Freq: Four times a day (QID) | RESPIRATORY_TRACT | Status: DC | PRN

## 2021-03-14 MED ORDER — BUPIVACAINE-EPINEPHRINE 0.25% -1:200000 IJ SOLN
INTRAMUSCULAR | Status: DC | PRN
  Administered 2021-03-14: 30 mL

## 2021-03-14 MED ORDER — HYDROCODONE-ACETAMINOPHEN 5-325 MG PO TABS
1.0000 | ORAL_TABLET | ORAL | 0 refills | Status: DC | PRN
  Filled 2021-03-14: qty 40, 4d supply, fill #0

## 2021-03-14 MED ORDER — GUAIFENESIN ER 600 MG PO TB12: 600.0000 mg | ORAL_TABLET | Freq: Every day | ORAL | Status: DC | PRN

## 2021-03-14 MED ORDER — HYDROCODONE-ACETAMINOPHEN 7.5-325 MG PO TABS
1.0000 | ORAL_TABLET | ORAL | Status: DC | PRN
  Administered 2021-03-14: 2 via ORAL
  Filled 2021-03-14: qty 2

## 2021-03-14 MED ORDER — TRANEXAMIC ACID-NACL 1000-0.7 MG/100ML-% IV SOLN
1000.0000 mg | INTRAVENOUS | Status: AC
  Administered 2021-03-14: 1000 mg via INTRAVENOUS
  Filled 2021-03-14: qty 100

## 2021-03-14 MED ORDER — FENTANYL CITRATE (PF) 100 MCG/2ML IJ SOLN
INTRAMUSCULAR | Status: AC
  Filled 2021-03-14: qty 2

## 2021-03-14 MED ORDER — ALUM & MAG HYDROXIDE-SIMETH 200-200-20 MG/5ML PO SUSP: 30.0000 mL | ORAL | Status: DC | PRN

## 2021-03-14 MED ORDER — IRRISEPT - 450ML BOTTLE WITH 0.05% CHG IN STERILE WATER, USP 99.95% OPTIME
TOPICAL | Status: DC | PRN
  Administered 2021-03-14: 450 mL via TOPICAL

## 2021-03-14 MED ORDER — DOCUSATE SODIUM 100 MG PO CAPS
100.0000 mg | ORAL_CAPSULE | Freq: Two times a day (BID) | ORAL | 1 refills | Status: AC | PRN
  Filled 2021-03-14: qty 30, 15d supply, fill #0

## 2021-03-14 MED ORDER — DEXAMETHASONE SODIUM PHOSPHATE 10 MG/ML IJ SOLN
INTRAMUSCULAR | Status: AC
  Filled 2021-03-14: qty 1

## 2021-03-14 MED ORDER — ASPIRIN EC 81 MG PO TBEC
81.0000 mg | DELAYED_RELEASE_TABLET | Freq: Two times a day (BID) | ORAL | 1 refills | Status: DC
  Filled 2021-03-14: qty 60, 30d supply, fill #0

## 2021-03-14 MED ORDER — LACTATED RINGERS IV SOLN: INTRAVENOUS | Status: DC

## 2021-03-14 MED ORDER — DEXTROSE 5 % IV SOLN
5.0000 mg/kg | INTRAVENOUS | Status: DC
  Administered 2021-03-14: 300 mg via INTRAVENOUS

## 2021-03-14 MED ORDER — BUPIVACAINE IN DEXTROSE 0.75-8.25 % IT SOLN
INTRATHECAL | Status: DC | PRN
  Administered 2021-03-14: 1.8 mL via INTRATHECAL

## 2021-03-14 MED ORDER — METHOCARBAMOL 500 MG PO TABS
500.0000 mg | ORAL_TABLET | Freq: Three times a day (TID) | ORAL | 1 refills | Status: DC | PRN
Start: 2021-03-14 — End: 2022-09-02
  Filled 2021-03-14: qty 40, 14d supply, fill #0

## 2021-03-14 MED ORDER — VITAMIN B-12 1000 MCG PO TABS
5000.0000 ug | ORAL_TABLET | Freq: Every day | ORAL | Status: DC
  Administered 2021-03-14: 5000 ug via ORAL
  Filled 2021-03-14: qty 5

## 2021-03-14 MED ORDER — PHENYLEPHRINE HCL 0.5 % NA SOLN
1.0000 [drp] | Freq: Four times a day (QID) | NASAL | Status: DC | PRN
  Filled 2021-03-14: qty 15

## 2021-03-14 MED ORDER — RISAQUAD PO CAPS
1.0000 | ORAL_CAPSULE | Freq: Every day | ORAL | Status: DC
  Administered 2021-03-15: 1 via ORAL
  Filled 2021-03-14: qty 1

## 2021-03-14 MED ORDER — BUPIVACAINE LIPOSOME 1.3 % IJ SUSP
20.0000 mL | Freq: Once | INTRAMUSCULAR | Status: AC
  Filled 2021-03-14: qty 20

## 2021-03-14 MED ORDER — POLYETHYLENE GLYCOL 3350 17 G PO PACK
17.0000 g | PACK | Freq: Every day | ORAL | Status: DC | PRN
  Administered 2021-03-14: 17 g via ORAL
  Filled 2021-03-14: qty 1

## 2021-03-14 MED ORDER — BUPIVACAINE-EPINEPHRINE (PF) 0.25% -1:200000 IJ SOLN
INTRAMUSCULAR | Status: AC
  Filled 2021-03-14: qty 30

## 2021-03-14 MED ORDER — METHOCARBAMOL 500 MG PO TABS
500.0000 mg | ORAL_TABLET | Freq: Four times a day (QID) | ORAL | Status: DC | PRN
  Administered 2021-03-14 – 2021-03-15 (×2): 500 mg via ORAL
  Filled 2021-03-14 (×2): qty 1

## 2021-03-14 MED ORDER — MAGNESIUM CITRATE PO SOLN
1.0000 | Freq: Once | ORAL | Status: DC | PRN
Start: 2021-03-14 — End: 2021-03-15

## 2021-03-14 MED ORDER — ADULT MULTIVITAMIN W/MINERALS CH
1.0000 | ORAL_TABLET | Freq: Every day | ORAL | Status: DC
  Administered 2021-03-14: 1 via ORAL
  Filled 2021-03-14: qty 1

## 2021-03-14 MED ORDER — PROPOFOL 500 MG/50ML IV EMUL
INTRAVENOUS | Status: DC | PRN
  Administered 2021-03-14: 50 ug/kg/min via INTRAVENOUS

## 2021-03-14 MED ORDER — KCL IN DEXTROSE-NACL 20-5-0.45 MEQ/L-%-% IV SOLN
INTRAVENOUS | Status: AC
  Filled 2021-03-14 (×2): qty 1000

## 2021-03-14 MED ORDER — FENTANYL CITRATE (PF) 100 MCG/2ML IJ SOLN: 25.0000 ug | INTRAMUSCULAR | Status: DC | PRN

## 2021-03-14 MED ORDER — FLUTICASONE PROPIONATE 50 MCG/ACT NA SUSP
2.0000 | Freq: Every day | NASAL | Status: DC
  Administered 2021-03-15: 2 via NASAL
  Filled 2021-03-14: qty 16

## 2021-03-14 MED ORDER — BUPIVACAINE LIPOSOME 1.3 % IJ SUSP
20.0000 mL | Freq: Once | INTRAMUSCULAR | Status: AC
  Administered 2021-03-14: 20 mL
  Filled 2021-03-14: qty 20

## 2021-03-14 MED ORDER — PHENOL 1.4 % MT LIQD: 1.0000 | OROMUCOSAL | Status: DC | PRN

## 2021-03-14 MED ORDER — PHENYLEPHRINE HCL (PRESSORS) 10 MG/ML IV SOLN
INTRAVENOUS | Status: AC
  Filled 2021-03-14: qty 1

## 2021-03-14 MED ORDER — PROPOFOL 10 MG/ML IV BOLUS
INTRAVENOUS | Status: DC | PRN
  Administered 2021-03-14 (×2): 20 mg via INTRAVENOUS

## 2021-03-14 MED ORDER — PROPOFOL 10 MG/ML IV BOLUS
INTRAVENOUS | Status: AC
  Filled 2021-03-14: qty 20

## 2021-03-14 MED ORDER — GENTAMICIN SULFATE 40 MG/ML IJ SOLN
5.0000 mg/kg | INTRAVENOUS | Status: AC
  Filled 2021-03-14: qty 7.5

## 2021-03-14 MED ORDER — MIDAZOLAM HCL 5 MG/5ML IJ SOLN
INTRAMUSCULAR | Status: DC | PRN
  Administered 2021-03-14: .5 mg via INTRAVENOUS
  Administered 2021-03-14: 1.5 mg via INTRAVENOUS

## 2021-03-14 MED ORDER — DOCUSATE SODIUM 100 MG PO CAPS
100.0000 mg | ORAL_CAPSULE | Freq: Two times a day (BID) | ORAL | Status: DC
  Administered 2021-03-14 – 2021-03-15 (×3): 100 mg via ORAL
  Filled 2021-03-14 (×3): qty 1

## 2021-03-14 MED ORDER — ARTIFICIAL TEARS OPHTHALMIC OINT
1.0000 "application " | TOPICAL_OINTMENT | Freq: Every day | OPHTHALMIC | Status: DC
  Filled 2021-03-14: qty 3.5

## 2021-03-14 MED ORDER — METHOCARBAMOL 1000 MG/10ML IJ SOLN
500.0000 mg | Freq: Four times a day (QID) | INTRAVENOUS | Status: DC | PRN
  Filled 2021-03-14: qty 5

## 2021-03-14 MED ORDER — ACETAMINOPHEN 10 MG/ML IV SOLN
1000.0000 mg | INTRAVENOUS | Status: AC
  Administered 2021-03-14: 1000 mg via INTRAVENOUS
  Filled 2021-03-14: qty 100

## 2021-03-14 MED ORDER — VANCOMYCIN HCL 1250 MG/250ML IV SOLN
1250.0000 mg | Freq: Two times a day (BID) | INTRAVENOUS | Status: AC
Start: 2021-03-14 — End: 2021-03-14
  Administered 2021-03-14: 1250 mg via INTRAVENOUS
  Filled 2021-03-14: qty 250

## 2021-03-14 MED ORDER — ONDANSETRON HCL 4 MG/2ML IJ SOLN
INTRAMUSCULAR | Status: DC | PRN
  Administered 2021-03-14: 4 mg via INTRAVENOUS

## 2021-03-14 MED ORDER — SODIUM CHLORIDE 0.9% FLUSH
INTRAVENOUS | Status: DC | PRN
  Administered 2021-03-14: 50 mL
  Administered 2021-03-14: 40 mL

## 2021-03-14 MED ORDER — EPHEDRINE 5 MG/ML INJ
INTRAVENOUS | Status: AC
  Filled 2021-03-14: qty 5

## 2021-03-14 MED ORDER — ONDANSETRON HCL 4 MG/2ML IJ SOLN: 4.0000 mg | Freq: Four times a day (QID) | INTRAMUSCULAR | Status: DC | PRN

## 2021-03-14 MED ORDER — DEXAMETHASONE SODIUM PHOSPHATE 10 MG/ML IJ SOLN
INTRAMUSCULAR | Status: DC | PRN
Start: 2021-03-14 — End: 2021-03-14
  Administered 2021-03-14: 10 mg via INTRAVENOUS

## 2021-03-14 MED ORDER — POLYVINYL ALCOHOL 1.4 % OP SOLN
1.0000 [drp] | Freq: Three times a day (TID) | OPHTHALMIC | Status: DC | PRN
  Filled 2021-03-14: qty 15

## 2021-03-14 MED ORDER — SUFENTANIL CITRATE 50 MCG/ML IV SOLN
INTRAVENOUS | Status: AC
  Filled 2021-03-14: qty 1

## 2021-03-14 MED ORDER — FLUTICASONE PROPIONATE 50 MCG/ACT NA SUSP
1.0000 | Freq: Every day | NASAL | Status: DC
  Administered 2021-03-14: 1 via NASAL

## 2021-03-14 MED ORDER — EPHEDRINE SULFATE-NACL 50-0.9 MG/10ML-% IV SOSY
PREFILLED_SYRINGE | INTRAVENOUS | Status: DC | PRN
  Administered 2021-03-14: 5 mg via INTRAVENOUS

## 2021-03-14 MED ORDER — POLYETHYLENE GLYCOL 3350 17 G PO PACK
17.0000 g | PACK | Freq: Every day | ORAL | 0 refills | Status: AC
  Filled 2021-03-14: qty 14, 14d supply, fill #0

## 2021-03-14 MED ORDER — SODIUM CHLORIDE (PF) 0.9 % IJ SOLN
INTRAMUSCULAR | Status: AC
  Filled 2021-03-14: qty 40

## 2021-03-14 MED ORDER — HYDROCODONE-ACETAMINOPHEN 5-325 MG PO TABS
1.0000 | ORAL_TABLET | ORAL | Status: DC | PRN
  Administered 2021-03-14 (×2): 2 via ORAL
  Administered 2021-03-15 (×2): 1 via ORAL
  Filled 2021-03-14: qty 1
  Filled 2021-03-14 (×2): qty 2
  Filled 2021-03-14: qty 1

## 2021-03-14 MED ORDER — LIDOCAINE 2% (20 MG/ML) 5 ML SYRINGE
INTRAMUSCULAR | Status: AC
  Filled 2021-03-14: qty 5

## 2021-03-14 MED ORDER — ROCURONIUM BROMIDE 10 MG/ML (PF) SYRINGE
PREFILLED_SYRINGE | INTRAVENOUS | Status: AC
  Filled 2021-03-14: qty 10

## 2021-03-14 MED ORDER — SUFENTANIL CITRATE 50 MCG/ML IV SOLN
INTRAVENOUS | Status: DC | PRN
  Administered 2021-03-14 (×2): 5 ug via INTRAVENOUS

## 2021-03-14 MED ORDER — ONDANSETRON HCL 4 MG PO TABS: 4.0000 mg | ORAL_TABLET | Freq: Four times a day (QID) | ORAL | Status: DC | PRN

## 2021-03-14 MED ORDER — MENTHOL 3 MG MT LOZG: 1.0000 | LOZENGE | OROMUCOSAL | Status: DC | PRN

## 2021-03-14 MED ORDER — OXYCODONE HCL 5 MG PO TABS
5.0000 mg | ORAL_TABLET | Freq: Once | ORAL | Status: AC
  Administered 2021-03-14: 5 mg via ORAL

## 2021-03-14 MED ORDER — ACETAMINOPHEN 500 MG PO TABS
500.0000 mg | ORAL_TABLET | Freq: Four times a day (QID) | ORAL | Status: DC
  Administered 2021-03-15 (×2): 500 mg via ORAL
  Filled 2021-03-14 (×3): qty 1

## 2021-03-14 MED ORDER — VANCOMYCIN HCL IN DEXTROSE 1-5 GM/200ML-% IV SOLN
1000.0000 mg | INTRAVENOUS | Status: AC
  Administered 2021-03-14: 1000 mg via INTRAVENOUS
  Filled 2021-03-14: qty 200

## 2021-03-14 MED ORDER — FLUTICASONE PROPIONATE 50 MCG/ACT NA SUSP: 1.0000 | NASAL | Status: DC

## 2021-03-14 MED ORDER — LORATADINE 10 MG PO TABS
10.0000 mg | ORAL_TABLET | Freq: Every day | ORAL | Status: DC
  Administered 2021-03-14: 10 mg via ORAL
  Filled 2021-03-14: qty 1

## 2021-03-14 MED ORDER — METOCLOPRAMIDE HCL 5 MG PO TABS: 5.0000 mg | ORAL_TABLET | Freq: Three times a day (TID) | ORAL | Status: DC | PRN

## 2021-03-14 MED ORDER — FENTANYL CITRATE (PF) 100 MCG/2ML IJ SOLN
INTRAMUSCULAR | Status: DC | PRN
  Administered 2021-03-14 (×2): 50 ug via INTRAVENOUS

## 2021-03-14 MED ORDER — PROPOFOL 500 MG/50ML IV EMUL
INTRAVENOUS | Status: AC
  Filled 2021-03-14: qty 50

## 2021-03-14 MED ORDER — SODIUM CHLORIDE 0.9 % IR SOLN
Status: DC | PRN
  Administered 2021-03-14: 1000 mL

## 2021-03-14 MED ORDER — MIDAZOLAM HCL 2 MG/2ML IJ SOLN
INTRAMUSCULAR | Status: AC
  Filled 2021-03-14: qty 2

## 2021-03-14 MED ORDER — ASPIRIN 81 MG PO CHEW
81.0000 mg | CHEWABLE_TABLET | Freq: Two times a day (BID) | ORAL | Status: DC
  Administered 2021-03-15: 81 mg via ORAL
  Filled 2021-03-14: qty 1

## 2021-03-14 MED ORDER — DIPHENHYDRAMINE HCL 12.5 MG/5ML PO ELIX: 12.5000 mg | ORAL_SOLUTION | ORAL | Status: DC | PRN

## 2021-03-14 MED ORDER — SODIUM CHLORIDE (PF) 0.9 % IJ SOLN
INTRAMUSCULAR | Status: AC
  Filled 2021-03-14: qty 10

## 2021-03-14 MED ORDER — HYDROCHLOROTHIAZIDE 25 MG PO TABS
12.5000 mg | ORAL_TABLET | Freq: Every day | ORAL | Status: DC
  Administered 2021-03-14 – 2021-03-15 (×2): 12.5 mg via ORAL
  Filled 2021-03-14 (×2): qty 1

## 2021-03-14 MED ORDER — PANTOPRAZOLE SODIUM 20 MG PO TBEC
20.0000 mg | DELAYED_RELEASE_TABLET | Freq: Every day | ORAL | Status: DC
  Administered 2021-03-14 – 2021-03-15 (×2): 20 mg via ORAL
  Filled 2021-03-14 (×2): qty 1

## 2021-03-14 MED ORDER — ONDANSETRON HCL 4 MG/2ML IJ SOLN
INTRAMUSCULAR | Status: AC
  Filled 2021-03-14: qty 2

## 2021-03-14 MED ORDER — BISACODYL 5 MG PO TBEC: 5.0000 mg | DELAYED_RELEASE_TABLET | Freq: Every day | ORAL | Status: DC | PRN

## 2021-03-14 MED ORDER — METOCLOPRAMIDE HCL 5 MG/ML IJ SOLN: 5.0000 mg | Freq: Three times a day (TID) | INTRAMUSCULAR | Status: DC | PRN

## 2021-03-14 MED ORDER — 0.9 % SODIUM CHLORIDE (POUR BTL) OPTIME
TOPICAL | Status: DC | PRN
  Administered 2021-03-14: 1000 mL

## 2021-03-14 MED ORDER — ACETAMINOPHEN 325 MG PO TABS: 325.0000 mg | ORAL_TABLET | Freq: Four times a day (QID) | ORAL | Status: DC | PRN

## 2021-03-14 SURGICAL SUPPLY — 83 items
AGENT HMST SPONGE THK3/8 (HEMOSTASIS)
ATTUNE PS FEM RT SZ 5 CEM KNEE (Femur) ×1 IMPLANT
ATTUNE PSRP INSR SZ5 5 KNEE (Insert) ×1 IMPLANT
BAG COUNTER SPONGE SURGICOUNT (BAG) ×1 IMPLANT
BAG DECANTER FOR FLEXI CONT (MISCELLANEOUS) ×2 IMPLANT
BAG SPEC THK2 15X12 ZIP CLS (MISCELLANEOUS)
BAG SPNG CNTER NS LX DISP (BAG) ×1
BAG ZIPLOCK 12X15 (MISCELLANEOUS) IMPLANT
BASE TIBIAL ROT PLAT SZ 5 KNEE (Knees) IMPLANT
BLADE SAW SGTL 11.0X1.19X90.0M (BLADE) ×2 IMPLANT
BLADE SAW SGTL 13.0X1.19X90.0M (BLADE) ×2 IMPLANT
BLADE SURG SZ10 CARB STEEL (BLADE) ×4 IMPLANT
BNDG COHESIVE 4X5 TAN STRL (GAUZE/BANDAGES/DRESSINGS) ×2 IMPLANT
BNDG ELASTIC 4X5.8 VLCR STR LF (GAUZE/BANDAGES/DRESSINGS) ×2 IMPLANT
BNDG ELASTIC 6X5.8 VLCR STR LF (GAUZE/BANDAGES/DRESSINGS) ×2 IMPLANT
BSPLAT TIB 5 CMNT ROT PLAT STR (Knees) ×1 IMPLANT
CEMENT HV SMART SET (Cement) ×4 IMPLANT
COVER SURGICAL LIGHT HANDLE (MISCELLANEOUS) ×2 IMPLANT
CUFF TOURN SGL QUICK 34 (TOURNIQUET CUFF) ×2
CUFF TRNQT CYL 34X4.125X (TOURNIQUET CUFF) ×1 IMPLANT
DECANTER SPIKE VIAL GLASS SM (MISCELLANEOUS) ×2 IMPLANT
DRAPE INCISE IOBAN 66X45 STRL (DRAPES) ×1 IMPLANT
DRAPE ORTHO SPLIT 77X108 STRL (DRAPES) ×4
DRAPE SHEET LG 3/4 BI-LAMINATE (DRAPES) ×4 IMPLANT
DRAPE SURG ORHT 6 SPLT 77X108 (DRAPES) ×2 IMPLANT
DRAPE U-SHAPE 47X51 STRL (DRAPES) ×2 IMPLANT
DRSG AQUACEL AG ADV 3.5X10 (GAUZE/BANDAGES/DRESSINGS) ×2 IMPLANT
DRSG TEGADERM 4X4.75 (GAUZE/BANDAGES/DRESSINGS) IMPLANT
DURAPREP 26ML APPLICATOR (WOUND CARE) ×2 IMPLANT
ELECT BLADE TIP CTD 4 INCH (ELECTRODE) ×2 IMPLANT
ELECT REM PT RETURN 15FT ADLT (MISCELLANEOUS) ×2 IMPLANT
EVACUATOR 1/8 PVC DRAIN (DRAIN) IMPLANT
GAUZE SPONGE 2X2 8PLY STRL LF (GAUZE/BANDAGES/DRESSINGS) IMPLANT
GLOVE SRG 8 PF TXTR STRL LF DI (GLOVE) ×1 IMPLANT
GLOVE SURG POLYISO LF SZ7.5 (GLOVE) ×4 IMPLANT
GLOVE SURG POLYISO LF SZ8 (GLOVE) ×4 IMPLANT
GLOVE SURG UNDER POLY LF SZ7.5 (GLOVE) ×2 IMPLANT
GLOVE SURG UNDER POLY LF SZ8 (GLOVE) ×2
GOWN STRL REUS W/TWL XL LVL3 (GOWN DISPOSABLE) ×4 IMPLANT
HANDPIECE INTERPULSE COAX TIP (DISPOSABLE) ×2
HEMOSTAT SPONGE AVITENE ULTRA (HEMOSTASIS) IMPLANT
HOLDER FOLEY CATH W/STRAP (MISCELLANEOUS) ×1 IMPLANT
IMMOBILIZER KNEE 20 (SOFTGOODS) ×2
IMMOBILIZER KNEE 20 THIGH 36 (SOFTGOODS) ×1 IMPLANT
JET LAVAGE IRRISEPT WOUND (IRRIGATION / IRRIGATOR) ×2
KIT TURNOVER KIT A (KITS) ×2 IMPLANT
LAVAGE JET IRRISEPT WOUND (IRRIGATION / IRRIGATOR) ×1 IMPLANT
MANIFOLD NEPTUNE II (INSTRUMENTS) ×2 IMPLANT
NDL SAFETY ECLIPSE 18X1.5 (NEEDLE) IMPLANT
NEEDLE HYPO 18GX1.5 SHARP (NEEDLE)
NS IRRIG 1000ML POUR BTL (IV SOLUTION) ×1 IMPLANT
PACK TOTAL KNEE CUSTOM (KITS) ×2 IMPLANT
PATELLA MEDIAL ATTUN 35MM KNEE (Knees) ×1 IMPLANT
PIN DRILL FIX HALF THREAD (BIT) ×1 IMPLANT
PIN STEINMAN FIXATION KNEE (PIN) ×1 IMPLANT
PROTECTOR NERVE ULNAR (MISCELLANEOUS) ×2 IMPLANT
SAW OSC TIP CART 19.5X105X1.3 (SAW) ×2 IMPLANT
SEALER BIPOLAR AQUA 6.0 (INSTRUMENTS) ×2 IMPLANT
SET HNDPC FAN SPRY TIP SCT (DISPOSABLE) ×1 IMPLANT
SPONGE GAUZE 2X2 STER 10/PKG (GAUZE/BANDAGES/DRESSINGS)
SPONGE SURGIFOAM ABS GEL 100 (HEMOSTASIS) IMPLANT
SPONGE T-LAP 18X18 ~~LOC~~+RFID (SPONGE) ×3 IMPLANT
STAPLER VISISTAT (STAPLE) ×1 IMPLANT
STRIP CLOSURE SKIN 1/2X4 (GAUZE/BANDAGES/DRESSINGS) IMPLANT
SUT BONE WAX W31G (SUTURE) ×2 IMPLANT
SUT MNCRL AB 4-0 PS2 18 (SUTURE) IMPLANT
SUT STRATAFIX 0 PDS 27 VIOLET (SUTURE) ×2
SUT VIC AB 1 CT1 27 (SUTURE) ×4
SUT VIC AB 1 CT1 27XBRD ANTBC (SUTURE) ×2 IMPLANT
SUT VIC AB 1 CTX 36 (SUTURE)
SUT VIC AB 1 CTX36XBRD ANBCTR (SUTURE) IMPLANT
SUT VIC AB 2-0 CT1 27 (SUTURE) ×6
SUT VIC AB 2-0 CT1 TAPERPNT 27 (SUTURE) ×3 IMPLANT
SUTURE STRATFX 0 PDS 27 VIOLET (SUTURE) ×1 IMPLANT
SYR 3ML LL SCALE MARK (SYRINGE) IMPLANT
SYR 50ML LL SCALE MARK (SYRINGE) IMPLANT
TIBIAL BASE ROT PLAT SZ 5 KNEE (Knees) ×2 IMPLANT
TOWER CARTRIDGE SMART MIX (DISPOSABLE) ×2 IMPLANT
TRAY FOLEY MTR SLVR 14FR STAT (SET/KITS/TRAYS/PACK) ×1 IMPLANT
TRAY FOLEY MTR SLVR 16FR STAT (SET/KITS/TRAYS/PACK) ×1 IMPLANT
WATER STERILE IRR 1000ML POUR (IV SOLUTION) ×2 IMPLANT
WIPE CHG CHLORHEXIDINE 2% (PERSONAL CARE ITEMS) ×2 IMPLANT
WRAP KNEE MAXI GEL POST OP (GAUZE/BANDAGES/DRESSINGS) ×2 IMPLANT

## 2021-03-14 NOTE — Anesthesia Procedure Notes (Signed)
Date/Time: 03/14/2021 8:30 AM Performed by: Sharlette Dense, CRNA Oxygen Delivery Method: Simple face mask

## 2021-03-14 NOTE — Evaluation (Signed)
Physical Therapy Evaluation Patient Details Name: Samantha Larsen MRN: [id number] DOB: Jan 04, 1956 Today's Date: 03/14/2021   History of Present Illness  Pt is 65 yo female admitted on 03/14/21 for R TKA.  She has hx of arhritis, asthma, GERD, HCL, and HTN.  Clinical Impression  Pt is s/p TKA resulting in the deficits listed below (see PT Problem List). At baseline pt is independent.  She resides with family and has good support for discharge.  Pt very motivated and with excellent rehab potential.  At evaluation, pt was able to ambulate 69' with RW. She had good pain control and quad activation.  R knee ROM was 5 to 80 degrees. Pt will benefit from skilled PT to increase their independence and safety with mobility to allow discharge to the venue listed below.      Follow Up Recommendations Follow surgeon's recommendation for DC plan and follow-up therapies;Supervision for mobility/OOB    Equipment Recommendations  3in1 (PT) (Pt going to borrow family RW since she just got rollator with her ins <5 years ago)    Recommendations for Other Services       Precautions / Restrictions Precautions Precautions: Fall Restrictions Weight Bearing Restrictions: Yes RLE Weight Bearing: Weight bearing as tolerated      Mobility  Bed Mobility Overal bed mobility: Needs Assistance Bed Mobility: Supine to Sit     Supine to sit: Min assist     General bed mobility comments: Min A for R LE; educated on AAROM techniques    Transfers Overall transfer level: Needs assistance Equipment used: Rolling walker (2 wheeled) Transfers: Sit to/from Stand Sit to Stand: Min guard         General transfer comment: Cues for hand placement and R LE management  Ambulation/Gait Ambulation/Gait assistance: Min guard Gait Distance (Feet): 75 Feet Assistive device: Rolling walker (2 wheeled) Gait Pattern/deviations: Step-to pattern;Decreased stride length;Antalgic;Decreased weight shift to right;Decreased  stance time - right Gait velocity: decreased   General Gait Details: Cues for RW use and proximity; good stability with step to pattern  Stairs            Wheelchair Mobility    Modified Rankin (Stroke Patients Only)       Balance Overall balance assessment: Needs assistance Sitting-balance support: No upper extremity supported Sitting balance-Leahy Scale: Good     Standing balance support: Bilateral upper extremity supported;No upper extremity supported Standing balance-Leahy Scale: Fair Standing balance comment: RW to ambulate but can static stand without support                             Pertinent Vitals/Pain Pain Assessment: 0-10 Pain Score: 6  Pain Location: R knee Pain Descriptors / Indicators: Discomfort Pain Intervention(s): Limited activity within patient's tolerance;Monitored during session;Premedicated before session;Ice applied    Home Living Family/patient expects to be discharged to:: Private residence Living Arrangements: Spouse/significant other Available Help at Discharge: Family;Available 24 hours/day Type of Home: House Home Access: Stairs to enter Entrance Stairs-Rails: None Entrance Stairs-Number of Steps: 2 Home Layout: One level Home Equipment: Grab bars - tub/shower;Shower seat;Walker - 4 wheels Additional Comments: can borrow 2 wheel RW    Prior Function Level of Independence: Independent         Comments: Pt independent with ADLs, IADLs, community ambulation, and driving     Hand Dominance        Extremity/Trunk Assessment   Upper Extremity Assessment Upper Extremity Assessment: Overall [facility]  for tasks assessed    Lower Extremity Assessment Lower Extremity Assessment: LLE deficits/detail;RLE deficits/detail RLE Deficits / Details: Expected post op changes; ROM : knee 5 to 80 degrees; MMT: 5/5 ankle, hip and knee at least 3/5 but not further tested RLE Sensation: WNL LLE Deficits / Details: ROM WFL: MMT  5/5 LLE Sensation: WNL    Cervical / Trunk Assessment Cervical / Trunk Assessment: Normal  Communication   Communication: No difficulties  Cognition Arousal/Alertness: Awake/alert Behavior During Therapy: WFL for tasks assessed/performed Overall Cognitive Status: Within Functional Limits for tasks assessed                                        General Comments  Educated on safe ice use, no pivots,  resting with leg straight. Also, encouraged walking every 1-2 hours during day. Educated on HEP with focus on mobility the first weeks. Discussed doing exercises within pain control and if pain increasing could decreased ROM, reps, and stop exercises as needed. Encouraged to perform quad sets and ankle pumps frequently for blood flow and to promote full knee extension.     Exercises Total Joint Exercises Ankle Circles/Pumps: AROM;10 reps;Supine;Both Quad Sets: AROM;10 reps;Both;Supine   Assessment/Plan    PT Assessment Patient needs continued PT services  PT Problem List Decreased strength;Decreased mobility;Decreased safety awareness;Decreased range of motion;Decreased activity tolerance;Decreased balance;Decreased knowledge of use of DME;Pain       PT Treatment Interventions DME instruction;Therapeutic activities;Modalities;Gait training;Therapeutic exercise;Patient/family education;Stair training;Balance training;Functional mobility training    PT Goals (Current goals can be found in the Care Plan section)  Acute Rehab PT Goals Patient Stated Goal: return home PT Goal Formulation: With patient/family Time For Goal Achievement: 03/28/21 Potential to Achieve Goals: Good    Frequency 7X/week   Barriers to discharge        Co-evaluation               AM-PAC PT "6 Clicks" Mobility  Outcome Measure Help needed turning from your back to your side while in a flat bed without using bedrails?: None Help needed moving from lying on your back to sitting on  the side of a flat bed without using bedrails?: A Little Help needed moving to and from a bed to a chair (including a wheelchair)?: A Little Help needed standing up from a chair using your arms (e.g., wheelchair or bedside chair)?: A Little Help needed to walk in hospital room?: A Little Help needed climbing 3-5 steps with a railing? : A Little 6 Click Score: 19    End of Session Equipment Utilized During Treatment: Gait belt Activity Tolerance: Patient tolerated treatment well Patient left: with call bell/phone within reach;with chair alarm set;in chair;with family/visitor present Nurse Communication: Mobility status PT Visit Diagnosis: Other abnormalities of gait and mobility (R26.89);Muscle weakness (generalized) (M62.81)    Time: 2376-2831 PT Time Calculation (min) (ACUTE ONLY): 24 min   Charges:   PT Evaluation $PT Eval Low Complexity: 1 Low PT Treatments $Gait Training: 8-22 mins        Abran Richard, PT Acute Rehab Services Pager [phone] [facility] 03/14/2021, 3:57 PM

## 2021-03-14 NOTE — Interval H&P Note (Signed)
History and Physical Interval Note:  03/14/2021 7:59 AM  Samantha Larsen  has presented today for surgery, with the diagnosis of right knee osteoarthritis.  The various methods of treatment have been discussed with the patient and family. After consideration of risks, benefits and other options for treatment, the patient has consented to  Procedure(s): TOTAL KNEE ARTHROPLASTY (Right) as a surgical intervention.  The patient's history has been reviewed, patient examined, no change in status, stable for surgery.  I have reviewed the patient's chart and labs.  Questions were answered to the patient's satisfaction.     Samantha Larsen

## 2021-03-14 NOTE — Discharge Instructions (Signed)
Elevate leg above heart 6x a day for 8minutes each Use knee immobilizer while walking until can SLR x 10 Use knee immobilizer in bed to keep knee in extension Aquacel dressing may remain in place until follow up. May shower with aquacel dressing in place. If the dressing becomes saturated or peels off, you may remove aquacel dressing. Do not remove steri-strips if they are present. Place new dressing with gauze and tape or ACE bandage which should be kept clean and dry and changed daily.   INSTRUCTIONS AFTER JOINT REPLACEMENT   Remove items at home which could result in a fall. This includes throw rugs or furniture in walking pathways ICE to the affected joint every three hours while awake for 30 minutes at a time, for at least the first 3-5 days, and then as needed for pain and swelling.  Continue to use ice for pain and swelling. You may notice swelling that will progress down to the foot and ankle.  This is normal after surgery.  Elevate your leg when you are not up walking on it.   Continue to use the breathing machine you got in the hospital (incentive spirometer) which will help keep your temperature down.  It is common for your temperature to cycle up and down following surgery, especially at night when you are not up moving around and exerting yourself.  The breathing machine keeps your lungs expanded and your temperature down.   DIET:  As you were doing prior to hospitalization, we recommend a well-balanced diet.  DRESSING / WOUND CARE / SHOWERING  Keep the surgical dressing until follow up.  The dressing is water proof, so you can shower without any extra covering.  IF THE DRESSING FALLS OFF or the wound gets wet inside, change the dressing with sterile gauze.  Please use good hand washing techniques before changing the dressing.  Do not use any lotions or creams on the incision until instructed by your surgeon.    ACTIVITY  Increase activity slowly as tolerated, but follow the weight  bearing instructions below.   No driving for 6 weeks or until further direction given by your physician.  You cannot drive while taking narcotics.  No lifting or carrying greater than 10 lbs. until further directed by your surgeon. Avoid periods of inactivity such as sitting longer than an hour when not asleep. This helps prevent blood clots.  You may return to work once you are authorized by your doctor.     WEIGHT BEARING   Weight bearing as tolerated with assist device (walker, cane, etc) as directed, use it as long as suggested by your surgeon or therapist, typically at least 4-6 weeks.   EXERCISES  Results after joint replacement surgery are often greatly improved when you follow the exercise, range of motion and muscle strengthening exercises prescribed by your doctor. Safety measures are also important to protect the joint from further injury. Any time any of these exercises cause you to have increased pain or swelling, decrease what you are doing until you are comfortable again and then slowly increase them. If you have problems or questions, call your caregiver or physical therapist for advice.   Rehabilitation is important following a joint replacement. After just a few days of immobilization, the muscles of the leg can become weakened and shrink (atrophy).  These exercises are designed to build up the tone and strength of the thigh and leg muscles and to improve motion. Often times heat used for twenty to thirty  minutes before working out will loosen up your tissues and help with improving the range of motion but do not use heat for the first two weeks following surgery (sometimes heat can increase post-operative swelling).   These exercises can be done on a training (exercise) mat, on the floor, on a table or on a bed. Use whatever works the best and is most comfortable for you.    Use music or television while you are exercising so that the exercises are a pleasant break in your day.  This will make your life better with the exercises acting as a break in your routine that you can look forward to.   Perform all exercises about fifteen times, three times per day or as directed.  You should exercise both the operative leg and the other leg as well.  Exercises include:   Quad Sets - Tighten up the muscle on the front of the thigh (Quad) and hold for 5-10 seconds.   Straight Leg Raises - With your knee straight (if you were given a brace, keep it on), lift the leg to 60 degrees, hold for 3 seconds, and slowly lower the leg.  Perform this exercise against resistance later as your leg gets stronger.  Leg Slides: Lying on your back, slowly slide your foot toward your buttocks, bending your knee up off the floor (only go as far as is comfortable). Then slowly slide your foot back down until your leg is flat on the floor again.  Angel Wings: Lying on your back spread your legs to the side as far apart as you can without causing discomfort.  Hamstring Strength:  Lying on your back, push your heel against the floor with your leg straight by tightening up the muscles of your buttocks.  Repeat, but this time bend your knee to a comfortable angle, and push your heel against the floor.  You may put a pillow under the heel to make it more comfortable if necessary.   A rehabilitation program following joint replacement surgery can speed recovery and prevent re-injury in the future due to weakened muscles. Contact your doctor or a physical therapist for more information on knee rehabilitation.    CONSTIPATION  Constipation is defined medically as fewer than three stools per week and severe constipation as less than one stool per week.  Even if you have a regular bowel pattern at home, your normal regimen is likely to be disrupted due to multiple reasons following surgery.  Combination of anesthesia, postoperative narcotics, change in appetite and fluid intake all can affect your bowels.   YOU MUST  use at least one of the following options; they are listed in order of increasing strength to get the job done.  They are all available over the counter, and you may need to use some, POSSIBLY even all of these options:    Drink plenty of fluids (prune juice may be helpful) and high fiber foods Colace 100 mg by mouth twice a day  Senokot for constipation as directed and as needed Dulcolax (bisacodyl), take with full glass of water  Miralax (polyethylene glycol) once or twice a day as needed.  If you have tried all these things and are unable to have a bowel movement in the first 3-4 days after surgery call either your surgeon or your primary doctor.    If you experience loose stools or diarrhea, hold the medications until you stool forms back up.  If your symptoms do not get better within  1 week or if they get worse, check with your doctor.  If you experience "the worst abdominal pain ever" or develop nausea or vomiting, please contact the office immediately for further recommendations for treatment.   ITCHING:  If you experience itching with your medications, try taking only a single pain pill, or even half a pain pill at a time.  You can also use Benadryl over the counter for itching or also to help with sleep.   TED HOSE STOCKINGS:  Use stockings on both legs until for at least 2 weeks or as directed by physician office. They may be removed at night for sleeping.  MEDICATIONS:  See your medication summary on the "After Visit Summary" that nursing will review with you.  You may have some home medications which will be placed on hold until you complete the course of blood thinner medication.  It is important for you to complete the blood thinner medication as prescribed.  PRECAUTIONS:  If you experience chest pain or shortness of breath - call 911 immediately for transfer to the hospital emergency department.   If you develop a fever greater that 101 F, purulent drainage from wound, increased  redness or drainage from wound, foul odor from the wound/dressing, or calf pain - CONTACT YOUR SURGEON.                                                   FOLLOW-UP APPOINTMENTS:  If you do not already have a post-op appointment, please call the office for an appointment to be seen by your surgeon.  Guidelines for how soon to be seen are listed in your "After Visit Summary", but are typically between 1-4 weeks after surgery.  OTHER INSTRUCTIONS:   Knee Replacement:  Do not place pillow under knee, focus on keeping the knee straight while resting. CPM instructions: 0-90 degrees, 2 hours in the morning, 2 hours in the afternoon, and 2 hours in the evening. Place foam block, curve side up under heel at all times except when in CPM or when walking.  DO NOT modify, tear, cut, or change the foam block in any way.  POST-OPERATIVE OPIOID TAPER INSTRUCTIONS: It is important to wean off of your opioid medication as soon as possible. If you do not need pain medication after your surgery it is ok to stop day one. Opioids include: Codeine, Hydrocodone(Norco, Vicodin), Oxycodone(Percocet, oxycontin) and hydromorphone amongst others.  Long term and even short term use of opiods can cause: Increased pain response Dependence Constipation Depression Respiratory depression And more.  Withdrawal symptoms can include Flu like symptoms Nausea, vomiting And more Techniques to manage these symptoms Hydrate well Eat regular healthy meals Stay active Use relaxation techniques(deep breathing, meditating, yoga) Do Not substitute Alcohol to help with tapering If you have been on opioids for less than two weeks and do not have pain than it is ok to stop all together.  Plan to wean off of opioids This plan should start within one week post op of your joint replacement. Maintain the same interval or time between taking each dose and first decrease the dose.  Cut the total daily intake of opioids by one tablet each  day Next start to increase the time between doses. The last dose that should be eliminated is the evening dose.   MAKE SURE YOU:  Understand  these instructions.  Get help right away if you are not doing well or get worse.    Thank you for letting us be a part of your medical care team.  It is a privilege we respect greatly.  We hope these instructions will help you stay on track for a fast and full recovery!      

## 2021-03-14 NOTE — Anesthesia Procedure Notes (Signed)
Spinal  Patient location during procedure: OR Reason for block: surgical anesthesia Staffing Performed: resident/CRNA  Resident/CRNA: Sharlette Dense, CRNA Preanesthetic Checklist Completed: patient identified, IV checked, site marked, risks and benefits discussed, surgical consent, monitors and equipment checked, pre-op evaluation and timeout performed Spinal Block Patient position: sitting Prep: DuraPrep and site prepped and draped Patient monitoring: heart rate, continuous pulse ox and blood pressure Approach: midline Location: L3-4 Injection technique: single-shot Needle Needle type: Pencan  Needle gauge: 24 G Needle length: 9 cm Assessment Sensory level: T6 Additional Notes Kit expiration 01/23/2022 and lot #[id number] Clear free flow CSF, negative heme, negative paresthesia Tolerated well and returned to supine position

## 2021-03-14 NOTE — Anesthesia Procedure Notes (Signed)
Anesthesia Regional Block: Adductor canal block   Pre-Anesthetic Checklist: , timeout performed,  Correct Patient, Correct Site, Correct Laterality,  Correct Procedure, Correct Position, site marked,  Risks and benefits discussed,  Surgical consent,  Pre-op evaluation,  At surgeon's request  Laterality: Right  Prep: chloraprep       Needles:  Injection technique: Single-shot  Needle Type: Echogenic Stimulator Needle          Additional Needles:   Procedures: Doppler guided,,,, ultrasound used (permanent image in chart),,    Narrative:  Start time: 03/14/2021 7:55 AM End time: 03/14/2021 8:10 AM Injection made incrementally with aspirations every 5 mL.  Performed by: Personally  Anesthesiologist: Belinda Block, MD

## 2021-03-14 NOTE — Interval H&P Note (Signed)
History and Physical Interval Note:  03/14/2021 8:00 AM  Samantha Larsen  has presented today for surgery, with the diagnosis of right knee osteoarthritis.  The various methods of treatment have been discussed with the patient and family. After consideration of risks, benefits and other options for treatment, the patient has consented to  Procedure(s): TOTAL KNEE ARTHROPLASTY (Right) as a surgical intervention.  The patient's history has been reviewed, patient examined, no change in status, stable for surgery.  I have reviewed the patient's chart and labs.  Questions were answered to the patient's satisfaction.     Johnn Hai

## 2021-03-14 NOTE — Transfer of Care (Signed)
Immediate Anesthesia Transfer of Care Note  Patient: Samantha Larsen  Procedure(s) Performed: TOTAL KNEE ARTHROPLASTY (Right: Knee)  Patient Location: PACU  Anesthesia Type:Spinal  Level of Consciousness: awake, alert  and oriented  Airway & Oxygen Therapy: Patient Spontanous Breathing and Patient connected to face mask oxygen  Post-op Assessment: Report given to RN and Post -op Vital signs reviewed and stable  Post vital signs: Reviewed and stable  Last Vitals:  Vitals Value Taken Time  BP 151/69 03/14/21 1120  Temp    Pulse 56 03/14/21 1122  Resp 13 03/14/21 1122  SpO2 100 % 03/14/21 1122  Vitals shown include unvalidated device data.  Last Pain:  Vitals:   03/14/21 0641  TempSrc: Oral         Complications: No notable events documented.

## 2021-03-14 NOTE — Interval H&P Note (Signed)
History and Physical Interval Note:  03/14/2021 7:59 AM  Samantha Larsen  has presented today for surgery, with the diagnosis of right knee osteoarthritis.  The various methods of treatment have been discussed with the patient and family. After consideration of risks, benefits and other options for treatment, the patient has consented to  Procedure(s): TOTAL KNEE ARTHROPLASTY (Right) as a surgical intervention.  The patient's history has been reviewed, patient examined, no change in status, stable for surgery.  I have reviewed the patient's chart and labs.  Questions were answered to the patient's satisfaction.     Johnn Hai

## 2021-03-14 NOTE — Anesthesia Postprocedure Evaluation (Signed)
Anesthesia Post Note  Patient: Samantha Larsen  Procedure(s) Performed: TOTAL KNEE ARTHROPLASTY (Right: Knee)     Patient location during evaluation: PACU Anesthesia Type: Spinal Level of consciousness: awake Pain management: pain level controlled Vital Signs Assessment: post-procedure vital signs reviewed and stable Respiratory status: spontaneous breathing Cardiovascular status: stable Postop Assessment: no apparent nausea or vomiting Anesthetic complications: no   No notable events documented.  Last Vitals:  Vitals:   03/14/21 1200 03/14/21 1249  BP: (!) 157/69 (!) 155/77  Pulse: (!) 58 (!) 58  Resp: 11 16  Temp:  36.6 C  SpO2: 100% 98%    Last Pain:  Vitals:   03/14/21 1305  TempSrc:   PainSc: 6                  Zenaida Tesar

## 2021-03-14 NOTE — Op Note (Signed)
NAME: Samantha Larsen, BATTE MEDICAL RECORD NO: [id number] ACCOUNT NO: [id number] DATE OF BIRTH: 12/19/1955 FACILITY: [facility] LOCATION: [id number] PHYSICIAN: Johnn Hai, MD  Operative Report   DATE OF PROCEDURE: 03/14/2021  PREOPERATIVE DIAGNOSIS:  End-stage osteoarthrosis of the right knee.  POSTOPERATIVE DIAGNOSIS:  End-stage osteoarthrosis of the right knee.  PROCEDURE PERFORMED:  Right total knee arthroplasty utilizing a DePuy Attune rotating platform 5 femur, 5 tibia, 5 insert, 34 patella.  ANESTHESIA:  Spinal.  ASSISTANT:  Lacie Draft, PA.  HISTORY:  A 65 year old with end-stage osteoarthrosis medial compartment bone-on-bone, refractory to conservative treatment indicated for replacement of the degenerated joints.  Risks and benefits discussed including bleeding, infection, damage to  neurovascular structures.  No change in symptoms, worsening symptoms, DVT, PE, anesthetic complications, etc.  DESCRIPTION OF PROCEDURE:  With the patient in supine position.  After induction of adequate spinal anesthesia the right lower extremity was prepped and draped and exsanguinated in the usual sterile fashion with thigh tourniquet inflated to 250 mmHg.   The patient had a fairly adipose thigh.  We performed our incision over the anterior patella, midline to the tibial tubercle full thickness flaps developed.  Electrocautery was utilized to achieve any hemostasis.  I performed a medial parapatellar  arthrotomy.  Patella was everted, knee was flexed.  Tricompartmental osteoarthrosis was noted.  We elevated soft tissues medially, preserving the MCL, remove osteophytes from the medial tibial plateau.  There was eburnated bone in the medial compartment  noted.  Remnants of the medial and lateral menisci and the ACL were removed.  A step drill was utilized entering the femoral canal above the notch at location of the insertion of the anterior portion of the PCL.  This was in line with the femur.  The   patient had a short femur and tibia, so this was taken into account.  I then irrigated the intramedullary canal and placed a T-handle in it carefully.  This was entered into the canal.  This was in line with the femoral shaft.  I then introduced an  intramedullary guide, 9 off of the femur was utilized and then pinned.  We performed a distal femoral cut.  We then sized off the anterior cortex of the femur and it was a 5.  This was then pinned in 3 degrees of external rotation there.  I then  performed posterior and chamfer cuts without difficulty, protecting the soft tissue posteriorly at all times.  I did not notch the femur.  Next, we subluxed the tibia, further remnants of medial and lateral menisci were removed.  External alignment  guide, defect posteromedially 1 off the defect was utilized.  This was about 7 off the lateral side.  Bisecting the tibiotalar joint parallel with the shaft.  We then performed this cut protecting soft tissues at all times posteriorly.  The extension  block was tried at a 5 and it fit without difficulty.  It was satisfactory.  I then turned our attention back to the tibia, which was flexed.  Baseplate with maximal coverage was a 5 just to the medial aspect of the tibial tubercle.  This was then  pinned, harvested bone centrally and placed in the distal femoral canal.  We drilled essentially.  With the eburnated bone medially, we used an oscillating saw prior to the thin placement.  We performed our fin punch.  I then turned our attention back of  the femur and performed our box cut bisecting the canal.  I then used a  rasp to rasp the box cut.  This removed the PCL.  I placed a trial femur and then attempted to place a 5 mm insert.  However, this would not insert.  It was tight in flexion and  extension.  Therefore, decided to take two additional millimeters off of the tibia.  I removed the trials, subluxed the tibia, placed our pins back in the pin hole, lowered it 2 mm and  then with an oscillating saw performed a 2 mm cut.  Protecting soft  tissues at all times including the popliteus.  Following this, the trials were then placed back.  I reduced it, placed a 5 mm insert, which was reduced without difficulty, had full extension, full flexion, good stability to varus and valgus stressing at  0 and 30 degrees.  Negative anterior drawer.  Attention turned towards the patella.  It was everted, measured at 21, planned to a 14 utilizing the external patellar jig.  This was performed without difficulty and then I sized to a 34 medializing the peg  holes with a trial paddle parallel to the joint.  I drilled these peg holes.  We placed the trial patella and reduced it, had excellent patellofemoral tracking.  I then removed all instrumentation.  I checked posteriorly.  The popliteus was intact as was  the capsule.  Cauterized geniculates with the Aquamantys.  Copiously irrigated with pulsatile lavage.  I then flexed the knee, everted the patella, thoroughly dried all surfaces.  Mixed cement on the back table, appropriate fashion under vacuum injected  into the tibia, digitally pressurizing it.  I placed it on the permanent tibial tray and then impacted the true tibial tray with redundant cement removed.  I cemented and impacted the femur with redundant cement removed.  We used a 5 mm insert with some  difficulty inserting the trials and removed the peg.  This was then inserted.  I then placed the leg in extension, held in axial load throughout the curing of the cement.  With redundant cement removed.  I placed 0.25% Marcaine with epinephrine in the  wound during the curing of the cement and covered the wound.  After curing the cement, the tourniquet was deflated at 79 minutes.  Any minimal bleeding was cauterized.  I had good flexion and extension and good stability.  I removed the 5 mm insert and  meticulously removed all redundant cement.  Copiously irrigated with pulsatile lavage,  antibiotic irrigation, subluxed the tibia and then placed a 5 permanent insert.  This was then reduced.  I had full extension, full flexion and good stability with  varus and valgus stressing at 0 and 30 degrees and negative anterior drawer.  I next injected the quadriceps and then medially in the subcutaneous tissues and left some of the joint of Exparel.  I then repaired the patellar with arthrotomy in slight  flexion with #1 Vicryl in interrupted figure-of-eight sutures and oversewed it with a running Stratafix.  Following this, I had excellent patellofemoral tracking, full flexion and full extension and good stability.  We then copiously irrigated the wound,  subcutaneous with 2-0 and skin with staples due to the patient's elevated BMI and subcutaneous adipose tissue.  Sterile dressing was applied.  She was placed in an immobilizer and transported to the recovery room in satisfactory condition.  The patient tolerated the procedure well.  No complications.  Assistance of [place], [place] was used throughout the case for positioning, holding the leg and closure.  PUS D: 03/14/2021 11:14:44 am T: 03/14/2021 1:16:00 pm  JOB: [id number]/ [id number]

## 2021-03-14 NOTE — Brief Op Note (Signed)
03/14/2021  11:05 AM  PATIENT:  Samantha Larsen  65 y.o. female  PRE-OPERATIVE DIAGNOSIS:  right knee osteoarthritis  POST-OPERATIVE DIAGNOSIS:  right knee osteoarthritis  PROCEDURE:  Procedure(s): TOTAL KNEE ARTHROPLASTY (Right)  SURGEON:  Surgeon(s) and Role:    Susa Day, MD - Primary  PHYSICIAN ASSISTANT:   ASSISTANTS: [place]iss[place]   ANESTHESIA:   spinal  EBL:  50 mL   BLOOD ADMINISTERED:none  DRAINS: none   LOCAL MEDICATIONS USED:  MARCAINE     SPECIMEN:  No Specimen  DISPOSITION OF SPECIMEN:  N/A  COUNTS:  YES  TOURNIQUET:   Total Tourniquet Time Documented: Thigh (Right) - 79 minutes Total: Thigh (Right) - 79 minutes   DICTATION: .Other Dictation: Dictation Number   [id number]   PLAN OF CARE: Admit to inpatient   PATIENT DISPOSITION:  PACU - hemodynamically stable.   Delay start of Pharmacological VTE agent (>24hrs) due to surgical blood loss or risk of bleeding: no

## 2021-03-15 ENCOUNTER — Other Ambulatory Visit (HOSPITAL_COMMUNITY): Payer: Self-pay

## 2021-03-15 ENCOUNTER — Encounter (HOSPITAL_COMMUNITY): Payer: Self-pay | Admitting: Specialist

## 2021-03-15 LAB — CBC
HCT: 34.2 % — ABNORMAL LOW (ref 36.0–46.0)
Hemoglobin: 11.6 g/dL — ABNORMAL LOW (ref 12.0–15.0)
MCH: 33 pg (ref 26.0–34.0)
MCHC: 33.9 g/dL (ref 30.0–36.0)
MCV: 97.2 fL (ref 80.0–100.0)
Platelets: 331 10*3/uL (ref 150–400)
RBC: 3.52 MIL/uL — ABNORMAL LOW (ref 3.87–5.11)
RDW: 13.6 % (ref 11.5–15.5)
WBC: 15.7 10*3/uL — ABNORMAL HIGH (ref 4.0–10.5)
nRBC: 0 % (ref 0.0–0.2)

## 2021-03-15 LAB — BASIC METABOLIC PANEL
Anion gap: 8 (ref 5–15)
BUN: 11 mg/dL (ref 8–23)
CO2: 27 mmol/L (ref 22–32)
Calcium: 9.5 mg/dL (ref 8.9–10.3)
Chloride: 102 mmol/L (ref 98–111)
Creatinine, Ser: 0.77 mg/dL (ref 0.44–1.00)
GFR, Estimated: >60 mL/min (ref >60)
Glucose, Bld: 132 mg/dL — ABNORMAL HIGH (ref 70–99)
Potassium: 4.6 mmol/L (ref 3.5–5.1)
Sodium: 137 mmol/L (ref 135–145)

## 2021-03-15 LAB — GLUCOSE, CAPILLARY
Glucose-Capillary: 124 mg/dL — ABNORMAL HIGH (ref 70–99)
Glucose-Capillary: 111 mg/dL — ABNORMAL HIGH (ref 70–99)

## 2021-03-15 NOTE — Progress Notes (Signed)
Patient ID: Samantha Larsen, female   DOB: 07-Dec-1955, 65 y.o.   MRN: [id number] Subjective: 1 Day Post-Op Procedure(s) (LRB): TOTAL KNEE ARTHROPLASTY (Right) Patient reports pain as mild and moderate.    Patient has complaints of knee pain  We will start therapy today. Plan is to go home with HHPT after hospital stay.  Objective: Vital signs in last 24 hours: Temp:  [97.5 F (36.4 C)-99 F (37.2 C)] 98.1 F (36.7 C) (07/21 0548) Pulse Rate:  [56-65] 65 (07/21 0828) Resp:  [11-16] 14 (07/21 0548) BP: (133-164)/(59-85) 152/72 (07/21 0548) SpO2:  [98 %-100 %] 100 % (07/21 0548) Weight:  [87 kg] 87 kg (07/20 1337)  Intake/Output from previous day:  Intake/Output Summary (Last 24 hours) at 03/15/2021 1002 Last data filed at 03/15/2021 0804 Gross per 24 hour  Intake 3592.5 ml  Output 2700 ml  Net 892.5 ml    Intake/Output this shift: Total I/O In: -  Out: 200 [Urine:200]  Labs: Results for orders placed or performed during the hospital encounter of 03/14/21  CBC  Result Value Ref Range   WBC 15.7 (H) 4.0 - 10.5 K/uL   RBC 3.52 (L) 3.87 - 5.11 MIL/uL   Hemoglobin 11.6 (L) 12.0 - 15.0 g/dL   HCT 34.2 (L) 36.0 - 46.0 %   MCV 97.2 80.0 - 100.0 fL   MCH 33.0 26.0 - 34.0 pg   MCHC 33.9 30.0 - 36.0 g/dL   RDW 13.6 11.5 - 15.5 %   Platelets 331 150 - 400 K/uL   nRBC 0.0 0.0 - 0.2 %  Basic metabolic panel  Result Value Ref Range   Sodium 137 135 - 145 mmol/L   Potassium 4.6 3.5 - 5.1 mmol/L   Chloride 102 98 - 111 mmol/L   CO2 27 22 - 32 mmol/L   Glucose, Bld 132 (H) 70 - 99 mg/dL   BUN 11 8 - 23 mg/dL   Creatinine, Ser 0.77 0.44 - 1.00 mg/dL   Calcium 9.5 8.9 - 10.3 mg/dL   GFR, Estimated >60 >60 mL/min   Anion gap 8 5 - 15  Glucose, capillary  Result Value Ref Range   Glucose-Capillary 124 (H) 70 - 99 mg/dL    Exam - Neurologically intact ABD soft Neurovascular intact Sensation intact distally Intact pulses distally Dorsiflexion/Plantar flexion intact Incision:  dressing C/D/I and no drainage No cellulitis present Compartment soft No calf pain or sign of DVT Dressing - clean, dry, no drainage Motor function intact - moving foot and toes well on exam.  Assessment/Plan: 1 Day Post-Op Procedure(s) (LRB): TOTAL KNEE ARTHROPLASTY (Right)  Advance diet Up with therapy D/C IV fluids Past Medical History:  Diagnosis Date   Allergic rhinitis, unspecified    Arthritis    Asthma    Essential (primary) hypertension    Gastro-esophageal reflux disease with esophagitis    GERD (gastroesophageal reflux disease)    Hypercholesteremia    Hyperlipidemia    Hypertension    Obesity, unspecified    Pain in unspecified knee    Plantar fascial fibromatosis    Unspecified osteoarthritis, unspecified site    Anticipated LOS equal to or greater than 2 midnights due to - Age 42 and older with one or more of the following:  - Obesity  - Expected need for hospital services (PT, OT, Nursing) required for safe  discharge  - Anticipated need for postoperative skilled nursing care or inpatient rehab  - Active co-morbidities: None OR   - Unanticipated findings during/Post  Surgery: None  - Patient is a high risk of re-admission due to: None    DVT Prophylaxis - ASA Protocol Weight-Bearing as tolerated to Right leg No vaccines. Awaiting arrangement of HHPT Possible D/C later today depending on progress with PT otherwise tomorrow Seen by myself and Dr Valentino Saxon Deatra Robinson 03/15/2021, 10:02 AM

## 2021-03-15 NOTE — Discharge Summary (Addendum)
Physician Discharge Summary   Patient ID: Samantha Larsen MRN: [id number] DOB/AGE: 66-06-1956 66 y.o.  Admit date: 03/14/2021 Discharge date: 03/15/2021  Primary Diagnosis: right knee osteoarthritis  Admission Diagnoses:  Past Medical History:  Diagnosis Date   Allergic rhinitis, unspecified    Arthritis    Asthma    Essential (primary) hypertension    Gastro-esophageal reflux disease with esophagitis    GERD (gastroesophageal reflux disease)    Hypercholesteremia    Hyperlipidemia    Hypertension    Obesity, unspecified    Pain in unspecified knee    Plantar fascial fibromatosis    Unspecified osteoarthritis, unspecified site    Discharge Diagnoses:   Active Problems:   Right knee DJD  Estimated body mass index is 38.74 kg/m as calculated from the following:   Height as of this encounter: 4\' 11"  (1.499 m).   Weight as of this encounter: 87 kg.  Procedure:  Procedure(s) (LRB): TOTAL KNEE ARTHROPLASTY (Right)   Consults: None  HPI: see H&P Laboratory Data: Admission on 03/14/2021  Component Date Value Ref Range Status   WBC 03/15/2021 15.7 (A) 4.0 - 10.5 K/uL Final   RBC 03/15/2021 3.52 (A) 3.87 - 5.11 MIL/uL Final   Hemoglobin 03/15/2021 11.6 (A) 12.0 - 15.0 g/dL Final   HCT 03/15/2021 34.2 (A) 36.0 - 46.0 % Final   MCV 03/15/2021 97.2  80.0 - 100.0 fL Final   MCH 03/15/2021 33.0  26.0 - 34.0 pg Final   MCHC 03/15/2021 33.9  30.0 - 36.0 g/dL Final   RDW 03/15/2021 13.6  11.5 - 15.5 % Final   Platelets 03/15/2021 331  150 - 400 K/uL Final   nRBC 03/15/2021 0.0  0.0 - 0.2 % Final   Performed at [facility], [place] [address], [place], [place] [address]   Sodium 03/15/2021 137  135 - 145 mmol/L Final   Potassium 03/15/2021 4.6  3.5 - 5.1 mmol/L Final   Chloride 03/15/2021 102  98 - 111 mmol/L Final   CO2 03/15/2021 27  22 - 32 mmol/L Final   Glucose, Bld 03/15/2021 132 (A) 70 - 99 mg/dL Final   Glucose reference range applies only to samples  taken after fasting for at least 8 hours.   BUN 03/15/2021 11  8 - 23 mg/dL Final   Creatinine, Ser 03/15/2021 0.77  0.44 - 1.00 mg/dL Final   Calcium 03/15/2021 9.5  8.9 - 10.3 mg/dL Final   GFR, Estimated 03/15/2021 >60  >60 mL/min Final   Comment: (NOTE) Calculated using the CKD-EPI Creatinine Equation (2021)    Anion gap 03/15/2021 8  5 - 15 Final   Performed at [facility], [place] [address] [place], [place] [address]   Glucose-Capillary 03/15/2021 124 (A) 70 - 99 mg/dL Final   Glucose reference range applies only to samples taken after fasting for at least 8 hours.   Glucose-Capillary 03/15/2021 111 (A) 70 - 99 mg/dL Final   Glucose reference range applies only to samples taken after fasting for at least 8 hours.  Hospital Outpatient Visit on 03/12/2021  Component Date Value Ref Range Status   SARS Coronavirus 2 03/12/2021 NEGATIVE  NEGATIVE Final   Comment: (NOTE) SARS-CoV-2 target nucleic acids are NOT DETECTED.  The SARS-CoV-2 RNA is generally detectable in upper and lower respiratory specimens during the acute phase of infection. Negative results do not preclude SARS-CoV-2 infection, do not rule out co-infections with other pathogens, and should not be used as the sole basis  for treatment or other patient management decisions. Negative results must be combined with clinical observations, patient history, and epidemiological information. The expected result is Negative.  Fact Sheet for Patients: SugarRoll.be  Fact Sheet for Healthcare Providers: https://www.woods-mathews.com/  This test is not yet approved or cleared by the [place] FDA and  has been authorized for detection and/or diagnosis of SARS-CoV-2 by FDA under an Emergency Use Authorization (EUA). This EUA will remain  in effect (meaning this test can be used) for the duration of the COVID-19 declaration under Se                          ction  564(b)(1) of the Act, 21 U.S.C. section 360bbb-3(b)(1), unless the authorization is terminated or revoked sooner.  Performed at [facility], [place] [address], [place], [place] [address]   Hospital Outpatient Visit on 03/06/2021  Component Date Value Ref Range Status   MRSA, PCR 03/06/2021 NEGATIVE  NEGATIVE Final   Staphylococcus aureus 03/06/2021 NEGATIVE  NEGATIVE Final   Comment: (NOTE) The Xpert SA Assay (FDA approved for NASAL specimens in patients 49 years of age and older), is one component of a comprehensive surveillance program. It is not intended to diagnose infection nor to guide or monitor treatment. Performed at [facility], [place] [address] [place], [place] [address]    Color, Urine 03/06/2021 YELLOW  YELLOW Final   APPearance 03/06/2021 CLEAR  CLEAR Final   Specific Gravity, Urine 03/06/2021 1.026  1.005 - 1.030 Final   pH 03/06/2021 5.0  5.0 - 8.0 Final   Glucose, UA 03/06/2021 NEGATIVE  NEGATIVE mg/dL Final   Hgb urine dipstick 03/06/2021 NEGATIVE  NEGATIVE Final   Bilirubin Urine 03/06/2021 NEGATIVE  NEGATIVE Final   Ketones, ur 03/06/2021 NEGATIVE  NEGATIVE mg/dL Final   Protein, ur 03/06/2021 NEGATIVE  NEGATIVE mg/dL Final   Nitrite 03/06/2021 NEGATIVE  NEGATIVE Final   Leukocytes,Ua 03/06/2021 MODERATE (A) NEGATIVE Final   RBC / HPF 03/06/2021 0-5  0 - 5 RBC/hpf Final   WBC, UA 03/06/2021 11-20  0 - 5 WBC/hpf Final   Bacteria, UA 03/06/2021 MANY (A) NONE SEEN Final   Squamous Epithelial / LPF 03/06/2021 6-10  0 - 5 Final   Mucus 03/06/2021 PRESENT   Final   Hyaline Casts, UA 03/06/2021 PRESENT   Final   Performed at [facility], [place] [address] [place], [place] [address]     X-Rays:DG Knee 1-2 Views Right  Result Date: 03/14/2021 CLINICAL DATA:  Total knee replacement EXAM: RIGHT KNEE - 1-2 VIEW COMPARISON:  12/09/2017 FINDINGS: Total knee arthroplasty which is well seated. No acute fracture. Soft tissue  swelling and gas which is expected. IMPRESSION: No unexpected finding after total knee arthroplasty. Electronically Signed   By: Monte Fantasia M.D.   On: 03/14/2021 12:00    EKG: Orders placed or performed in visit on 05/08/18   EKG 12-Lead   EKG 12-Lead     Hospital Course: Samantha Larsen is a 65 y.o. who was admitted to [facility]. They were brought to the operating room on 03/14/2021 and underwent Procedure(s): TOTAL KNEE ARTHROPLASTY.  Patient tolerated the procedure well and was later transferred to the recovery room and then to the orthopaedic floor for postoperative care.  They were given PO and IV analgesics for pain control following their surgery.  They were given 24 hours of postoperative antibiotics of  Anti-infectives (From admission, onward)  Start     Dose/Rate Route Frequency Ordered Stop   03/14/21 2130  vancomycin (VANCOREADY) IVPB 1250 mg/250 mL       Note to Pharmacy: Dose per pharmacy   1,250 mg 166.7 mL/hr over 90 Minutes Intravenous Every 12 hours 03/14/21 1252 03/14/21 2315   03/14/21 0900  gentamicin (GARAMYCIN) 300 mg in dextrose 5 % 100 mL IVPB  Status:  Discontinued        5 mg/kg  60.8 kg (Order-Specific) 107.5 mL/hr over 60 Minutes Intravenous Every 24 hours 03/14/21 0854 03/14/21 0854   03/14/21 0900  gentamicin (GARAMYCIN) 300 mg in dextrose 5 % 100 mL IVPB        5 mg/kg  60.8 kg (Order-Specific) 215 mL/hr over 30 Minutes Intravenous On call to O.R. 03/14/21 0854 03/14/21 1000   03/14/21 0630  vancomycin (VANCOCIN) IVPB 1000 mg/200 mL premix        1,000 mg 200 mL/hr over 60 Minutes Intravenous On call to O.R. 03/14/21 1610 03/14/21 0830      and started on DVT prophylaxis in the form of Aspirin, TED hose, and SCDs .   PT and OT were ordered for total joint protocol.  Discharge planning consulted to help with postop disposition and equipment needs.  Patient had a good night on the evening of surgery.  They started to get up OOB with therapy  on day one. By day one, the patient had progressed with therapy and meeting their goals.  Incision was healing well.  Patient was seen in rounds and was ready to go home.   Diet: Regular diet Activity:WBAT Follow-up:in 2 weeks Disposition - Home with HHPT Discharged Condition: good   Discharge Instructions     Call MD / Call 911   Complete by: As directed    If you experience chest pain or shortness of breath, CALL 911 and be transported to the hospital emergency room.  If you develope a fever above 101 F, pus (white drainage) or increased drainage or redness at the wound, or calf pain, call your surgeon's office.   Constipation Prevention   Complete by: As directed    Drink plenty of fluids.  Prune juice may be helpful.  You may use a stool softener, such as Colace (over the counter) 100 mg twice a day.  Use MiraLax (over the counter) for constipation as needed.   Diet - low sodium heart healthy   Complete by: As directed    Increase activity slowly as tolerated   Complete by: As directed    Post-operative opioid taper instructions:   Complete by: As directed    POST-OPERATIVE OPIOID TAPER INSTRUCTIONS: It is important to wean off of your opioid medication as soon as possible. If you do not need pain medication after your surgery it is ok to stop day one. Opioids include: Codeine, Hydrocodone(Norco, Vicodin), Oxycodone(Percocet, oxycontin) and hydromorphone amongst others.  Long term and even short term use of opiods can cause: Increased pain response Dependence Constipation Depression Respiratory depression And more.  Withdrawal symptoms can include Flu like symptoms Nausea, vomiting And more Techniques to manage these symptoms Hydrate well Eat regular healthy meals Stay active Use relaxation techniques(deep breathing, meditating, yoga) Do Not substitute Alcohol to help with tapering If you have been on opioids for less than two weeks and do not have pain than it is ok  to stop all together.  Plan to wean off of opioids This plan should start within one week post op of  your joint replacement. Maintain the same interval or time between taking each dose and first decrease the dose.  Cut the total daily intake of opioids by one tablet each day Next start to increase the time between doses. The last dose that should be eliminated is the evening dose.         Allergies as of 03/15/2021       Reactions   Ibuprofen Swelling   Welping - in large doses   Penicillins Hives, Swelling   Has patient had a PCN reaction causing immediate rash, facial/tongue/throat swelling, SOB or lightheadedness with hypotension: YES Has patient had a PCN reaction causing severe rash involving mucus membranes or skin necrosis: NO Has patient had a PCN reaction that required hospitalization: No Has patient had a PCN reaction occurring within the last 10 years: No If all of the above answers are "NO", then may proceed with Cephalosporin use.   Wound Dressing Adhesive    Blisters - Bandaids        Medication List     STOP taking these medications    naproxen sodium 220 MG tablet Commonly known as: ALEVE   traMADol-acetaminophen 37.5-325 MG tablet Commonly known as: ULTRACET       TAKE these medications    acetaminophen 500 MG tablet Commonly known as: TYLENOL Take 500 mg by mouth every 6 (six) hours as needed for moderate pain.   albuterol 108 (90 Base) MCG/ACT inhaler Commonly known as: VENTOLIN HFA Inhale 1-2 puffs into the lungs every 6 (six) hours as needed for wheezing or shortness of breath.   aspirin EC 81 MG tablet Take 1 tablet (81 mg total) by mouth 2 (two) times daily after a meal. Day after surgery   docusate sodium 100 MG capsule Commonly known as: Colace Take 1 capsule (100 mg total) by mouth 2 (two) times daily as needed for mild constipation.   FISH OIL PO Take 1 capsule by mouth at bedtime.   fluticasone 50 MCG/ACT nasal spray Commonly  known as: FLONASE Place 1-2 sprays into both nostrils See admin instructions. Instill 2 sprays in each nare in the morning and 1 spray into each nare at night   folic acid 599 MCG tablet Commonly known as: FOLVITE Take 800 mcg by mouth at bedtime.   guaiFENesin 600 MG 12 hr tablet Commonly known as: MUCINEX Take 600 mg by mouth daily as needed for cough or to loosen phlegm.   hydrochlorothiazide 12.5 MG tablet Commonly known as: HYDRODIURIL Take 12.5 mg by mouth in the morning.   HYDROcodone-acetaminophen 5-325 MG tablet Commonly known as: NORCO/VICODIN Take 1-2 tablets by mouth every 4 (four) hours as needed for severe pain.   lansoprazole 30 MG capsule Commonly known as: PREVACID Take 30 mg by mouth in the morning.   levocetirizine 5 MG tablet Commonly known as: XYZAL Take 5 mg by mouth at bedtime.   methocarbamol 500 MG tablet Commonly known as: Robaxin Take 1 tablet (500 mg total) by mouth every 8 (eight) hours as needed for muscle spasms.   multivitamins ther. w/minerals Tabs tablet Take 1 tablet by mouth at bedtime. Centrum Silver 50 +   nadolol 40 MG tablet Commonly known as: CORGARD Take 40 mg by mouth in the morning.   phenylephrine 0.5 % nasal solution Commonly known as: NEO-SYNEPHRINE Place 1 drop into both nostrils every 6 (six) hours as needed for congestion.   polyethylene glycol 17 g packet Commonly known as: MIRALAX / GLYCOLAX Mix 1 packet  with 4-8 of liquid and take by mouth daily as directed   Propylene Glycol 0.6 % Soln Place 1 drop into both eyes 3 (three) times daily as needed (dry eyes).   simvastatin 40 MG tablet Commonly known as: ZOCOR Take 40 mg by mouth at bedtime.   Systane 0.4-0.3 % Gel ophthalmic gel Generic drug: Polyethyl Glycol-Propyl Glycol Place 1 application into both eyes at bedtime.   traMADol 50 MG tablet Commonly known as: ULTRAM Take 50 mg by mouth 2 (two) times daily.   Vitamin B-12 5000 MCG Tbdp Take 5,000 mcg by  mouth at bedtime.        Follow-up Information     Susa Day, MD Follow up in 2 week(s).   Specialty: Orthopedic Surgery Contact information: [address] [place] [address] [phone]         [facility], [facility] Follow up.   Specialty: [place] Why: to provide home health physical therapy Contact information: [place] [address] [place] [address] [phone]                 Signed: Lacie Draft, PA-C Orthopaedic Surgery 03/15/2021, 12:53 PM

## 2021-03-15 NOTE — TOC Transition Note (Signed)
Transition of Care [facility]) - CM/SW Discharge Note   Patient Details  Name: Samantha Larsen MRN: [id number] Date of Birth: Nov 11, 1955  [facility] CM/SW Contact:  Lennart Pall, LCSW Phone Number: 03/15/2021, 12:51 PM   Clinical Narrative:     Met with pt and spouse and confirming pt has all needed DME at home.  Plan for HHPT - no agency preference - referral placed with [facility]. No further TOC needs.  Final next level of care: [place] Barriers to Discharge: No Barriers Identified   Patient Goals and CMS Choice Patient states their goals for this hospitalization and ongoing recovery are:: return home      Discharge Placement                       Discharge Plan and Services                DME Arranged: N/A DME Agency: NA       HH Arranged: PT HH Agency: [facility] Date [place]: 03/15/21 Time HH Agency Contacted: 1000 Representative spoke with at [place]: [place] ([place]) Interventions     Readmission Risk Interventions No flowsheet data found.

## 2021-03-15 NOTE — Progress Notes (Signed)
The patient is alert and oriented and has been seen by her physician. The orders for discharge were written. IV has been removed. Went over discharge instructions with patient and family. She is being discharged via wheelchair with all of her belongings.  

## 2021-03-15 NOTE — Progress Notes (Signed)
Physical Therapy Treatment Patient Details Name: Samantha Larsen MRN: [id number] DOB: [birth year] Today's Date: 03/15/2021    History of Present Illness Pt is 65 yo female admitted on 03/14/21 for R TKA.  She has hx of arhritis, asthma, GERD, HCL, and HTN.    PT Comments     Pt is progressing toward acute PT goals with progression to stair training today. Pt required MIN guard-supervision for transfers and gait with RW. Patient was able to ambulate 40 feet with RW and supervision demonstrating proper technique of step to gait pattern with no LOB observed. Pt and husband educated on safe sequencing for stair mobility with MIN A for RW stability. Pt's husband demonstrated safe guarding position following cues from therapist. Patient instructed in HEP to facilitate ROM, strength, and circulation. Pt is currently at safe mobility level for d/c home. Pt and husband express no concerns about d/c with current mobility level. Patient will benefit from continued skilled PT interventions to address impairments and progress towards PLOF.     Follow Up Recommendations  Follow surgeon's recommendation for DC plan and follow-up therapies;Supervision for mobility/OOB     Equipment Recommendations  3in1 (PT) (Per eval note, pt will borrow RW from family member since she received rollator through ins <5years ago)    Recommendations for Other Services       Precautions / Restrictions Precautions Precautions: Fall Restrictions Weight Bearing Restrictions: No RLE Weight Bearing: Weight bearing as tolerated    Mobility  Bed Mobility Overal bed mobility: Needs Assistance Bed Mobility: Supine to Sit     Supine to sit: Supervision;HOB elevated     General bed mobility comments: Supervision for safety with HOB elevated. Pt with use of gait belt to progress Rt LE to EOB withcues from PT.    Transfers Overall transfer level: Needs assistance Equipment used: Rolling walker (2 wheeled) (youth) Transfers:  Sit to/from Stand Sit to Stand: Min guard         General transfer comment: MIN guard for safety, pt demonstrated good carryover of hand placemment with transfer  Ambulation/Gait Ambulation/Gait assistance: Supervision Gait Distance (Feet): 40 Feet Assistive device: Rolling walker (2 wheeled) Gait Pattern/deviations: Step-to pattern;Decreased stride length;Decreased weight shift to right Gait velocity: decreased   General Gait Details: supervision for safety with cues for increased WB thorugh UEs for pain control. Pt demonstrated safe techniuqe with step to gait pattern with use of RW.   Stairs Stairs: Yes Stairs assistance: Min assist Stair Management: No rails;Forwards;With walker Number of Stairs: 5 General stair comments: Pt performed stair 5x2 with MIN assist for RW stability and cues from therapist for sequencing "up with the good, down with the bad." Pt's husband demonstrated safe guarding position following cues from therapist. Pt and husband verbalized comfort with performing safe stair negotiation and all questions on education answered at this time. Pt also has ramp at home that can be placed down if needed.   Wheelchair Mobility    Modified Rankin (Stroke Patients Only)       Balance Overall balance assessment: Needs assistance Sitting-balance support: No upper extremity supported Sitting balance-Leahy Scale: Good     Standing balance support: Bilateral upper extremity supported Standing balance-Leahy Scale: Poor Standing balance comment: use of RW for standing balance support (increased pain levels today)                            Cognition Arousal/Alertness: Awake/alert Behavior During Therapy: WFL for  tasks assessed/performed Overall Cognitive Status: Within Functional Limits for tasks assessed                                        Exercises Total Joint Exercises Quad Sets: AROM;Right;10 reps;Seated Short Arc Quad:  AROM;Right;5 reps;Seated Heel Slides: AAROM;Right;5 reps;Seated Hip ABduction/ADduction: Right;5 reps;Seated;AAROM Straight Leg Raises: AAROM;Right;5 reps;Seated Long Arc Quad: AROM;Right;5 reps;Seated    General Comments        Pertinent Vitals/Pain Pain Assessment: 0-10 Pain Score: 9  Pain Location: R knee Pain Descriptors / Indicators: Discomfort Pain Intervention(s): Limited activity within patient's tolerance;Monitored during session    Home Living                      Prior Function            PT Goals (current goals can now be found in the care plan section) Acute Rehab PT Goals Patient Stated Goal: return home PT Goal Formulation: With patient/family Time For Goal Achievement: 03/28/21 Potential to Achieve Goals: Good Progress towards PT goals: Progressing toward goals    Frequency    7X/week      PT Plan Current plan remains appropriate    Co-evaluation              AM-PAC PT "6 Clicks" Mobility   Outcome Measure  Help needed turning from your back to your side while in a flat bed without using bedrails?: None Help needed moving from lying on your back to sitting on the side of a flat bed without using bedrails?: A Little Help needed moving to and from a bed to a chair (including a wheelchair)?: A Little Help needed standing up from a chair using your arms (e.g., wheelchair or bedside chair)?: A Little Help needed to walk in hospital room?: A Little Help needed climbing 3-5 steps with a railing? : A Little 6 Click Score: 19    End of Session Equipment Utilized During Treatment: Gait belt Activity Tolerance: Patient tolerated treatment well;Patient limited by pain (limited with further ambulation due to pain) Patient left: in chair;with call bell/phone within reach;with family/visitor present Nurse Communication: Mobility status PT Visit Diagnosis: Muscle weakness (generalized) (M62.81);Pain;Other abnormalities of gait and mobility  (R26.89);Unsteadiness on feet (R26.81) Pain - Right/Left: Right Pain - part of body: Knee     Time: 3295-1884 PT Time Calculation (min) (ACUTE ONLY): 36 min  Charges:  $Gait Training: 8-22 mins $Therapeutic Exercise: 8-22 mins                     Posey Rea, DPT  Acute Rehabilitation Services  Office [phone]    03/15/2021, 11:39 AM

## 2021-03-19 ENCOUNTER — Encounter: Payer: PRIVATE HEALTH INSURANCE | Attending: PA-C | Primary: MD

## 2021-03-20 ENCOUNTER — Institutional Professional Consult (permissible substitution): Admit: 2021-03-20 | Payer: PRIVATE HEALTH INSURANCE | Primary: MD

## 2021-03-20 ENCOUNTER — Institutional Professional Consult (permissible substitution): Payer: PRIVATE HEALTH INSURANCE | Primary: MD

## 2021-03-20 DIAGNOSIS — S62617A Displaced fracture of proximal phalanx of left little finger, initial encounter for closed fracture: Secondary | ICD-10-CM

## 2021-03-20 NOTE — Plan of Care (Signed)
[facility]  [address]  [place] [address]  Dept: [phone]  Fax: [phone]  Loc: [phone]    Occupational Therapy Evaluation & Initial Plan of Care  [facility]    Patient name: Nancy Richard DGLOV'F Date: 03/20/2021   Date of Birth: [birth year] Referring Provider: [name], PA-C   Visits from [facility]:    PCP: Leeanne Mannan III, MD   Date of Service: [facility] Date: 03/20/2021 - 03/20/2021 MRN: [id number]   Primary/Referral Dx: (O84.166A) - Closed displaced fracture of proximal phalanx of left little finger, initial encounter  Treatment Dx: Treatment Dx: Edema-Hand; Muscle Weakness; Pain in-Hand; Pain in joint-Hand; Stiffness in joint-Hand Gender: female    Onset Date: 02/17/2021  Referral Date: 03/08/2021 Payor: [employer] / Plan: [employer] / Product Type: *No Product type* /    Certified to (Recert or Discharge due): 06/18/2021 Treatment Time:  -         Chief Complaint: Pt reports reduction in functionality and effective use of her left hand following a forceful hyperextension injury of left digits resulting in fractures of the left small finger proximal phalanx.   Pain Level: Pt reports pain in left palm, MP pad and proximal phalanges of index, middle, ring and small fingers.  Pt describes pain as ranging from aching pain to a sharp stabbing pain.  ?  Current Precautions: Narrative: Healing left fifth proximal phalanx fracture (4 weeks at time of evaluation)  ?  History/Subjective:   Mechanism of Injury: Patient tripped in her yard over a small stone wall landing on her left digits forcibly hyperextending them at the MP joints.  Patient states that one of the fingers had to be realigned manually which she did herself, but was unsure which finger it was at the time of her ED visit.    Date of Injury/Onset of problem: 02/17/2021. Surgery date: N/A.  In the ED.  Patient was placed in a gutter splint which patient  describes as being in the safe position with MPs in flexion and IP's in extension.  She wore this for a while, but then independently removed it and begin doing her range of motion exercises out of fear that she would lose range of motion in her fingers.  ?  ?  Surgical History: No past surgical history on file.  ?  Related Medical History: No past medical history on file.  ?  Prior Treatment/Education and Results: Patient was seen in the ED on 02/17/2021 for assessment following her injury.  She was seen by Ortho on 03/08/2021 for reassessment and referred to occupational therapy at that time.  Pt has received no Occupational Therapy services for this dx.  ?  Functional ADL Limitations: Currently, Pt has difficulty using her left hand for gripping, pinching, small object manipulation and retention.  Patient has difficulty with weightbearing through the palm and pressing to her fingertips.  This is restricting patient's ability to complete all self-care, home care and driving activities.  Prior Level of Function: Prior to onset of left hand hyperextension injury resulting in fracture of left small proximal phalanx base, Pt was able to use her left hand for self care and home care activity with no difficulty or limitations.   Employment status:  Pt is a retired [employer] fundraiser.  ?  Driving Status: Modified independent.  Limited left hand use.  ?  Objective:  Pt is a left dominant female that presents today with no splint  support or soft support to the left digits or hand.  Patient presents unaccompanied today to her evaluation.  Posture: Pt demonstrates very minimal protective posture when examining/treating her left hand.  Skin/Soft tissue: Pt's skin is warm and dry to touch.  No open areas or bruising noted in the left palm or digits.  Patient has IP joint capsule tightness of left digits.  Patient has mild MP capsular tightness of ring and small fingers coming into flexion.  Location of pain: Pt reports pain in through the left  palm, MP pads of index, middle, ring and small fingers and proximal phalanges to the PIP joints of left digits.  Localized tenderness: Left palm (volar and dorsal) and MP pads.  Edema: Very minimal in left hand.  Patient states that her hand feels fat.  Sensation: Patient denies altered sensation in left hand/digits.     ?  Measurements and Observations:   Date 03/20/2021 ? ?   Strength Right/Left ? ? ?   Grip Strength   NT ? ?   Lateral Pinch Strength   NT ? ?   Range of Motion Right/Left ? ? ?   Thumb ? ? ?   Opposition to small finger MP pad 0 cm ? ?   Finger  ? ? ?   Index MP  0/70 ? ?   Index PIP  0/106 ? ?   Index DIP  0/56 ? ?   ? ? ? ?   Middle MP  0/65 ? ?   Middle PIP  0/111 ? ?   Middle DIP  0/50 ? ?   ? ? ? ?   Ring MP  0/51 ? ?   Ring PIP  0/109 ? ?   Ring DIP  0/45 ? ?   ? ? ? ?   Small MP  0/57 ? ?   Small PIP  0/81 ? ?   Small DIP  0/40 ? ?   Tip to [facility] (cm gap) ? ? ?   D2 1.0 cm ? ?   D3 1.5 cm ? ?   D4 1.5 cm ? ?   D5 2.0 cm ? ?   ?  ?  PSFS--Patient Specific Functional Scale 0 - 10  (0 = Unable to perform activity, 10 = Able to perform activity at the same level as before injury or problem)  Activity Initial 03/20/2021 Current  Discharge    1.  Open a jar with left hand assisting  0 ? ?   2.  Lifting grocery bag with left hand  0 ? ?   3.  Complete yard work with left hand assisting  0 ? ?   Total  0 ? ?   Total score = sum of the activity scores/number of activities  Minimum detectable change (90%CI) for average score = 2 points  Minimum detectable change (90%CI) for single activity score = 3 points  PSFS developed by: Jake Seats., & Binkley, J. (1995).   ?  Treatment this day:   Evaluation completed.   Dynamic warm up completed to left hand as Pt completed therapist guided AROM of hand, wrist and forearm while in Fluidotherapy x10 min.  Therapeutic exercise completed today for digit passive range of motion followed by digit active range of motion with written home program  provided to patient for comprehensive digit range of motion exercises.  Instruction also provided to the patient for soft tissue mobilization to left palm/hand.  ?  ASSESSMENT:   Reason for Referral to Occupational Therapy: Evaluation and treatment for stated dx.  Patient has severely limited function of her left dominant hand following hyperextension injury index, middle, ring and small digits resulting in fracture of the proximal phalanx of the small finger.  Patient has suffered a significant sprain to her interossei which makes decrease pressure at the tips of her digits or isolated MP flexion very uncomfortable.  Pain with activity has produced disuse deconditioning of the left hand.  OT feels that once patient restores digit range of motion and flexibility, her pain is going to subside significantly, but she will still have discomfort during deconditioning due to strain to her interossei.  Patient likely has thickening of the volar MP joint capsules from hyperextension injury which is giving the patient a feeling of thickness with MP flexion.  This resistance will also strain the interossei as they are the primary flexors of the MP joints.  Patient's current symptoms, following onset of MP hyperextension injury of left digits leading to proximal phalanx fracture of the small finger, are having a negative effect on completion of patient's ADLs.  Patient is an excellent candidate for skilled OT services to assist with symptom management and restoration of optimal achievable ADL function.  ?  Impressions/Justification for skilled intervention: Nancy Richard  requires skilled OT services to address functional limitations resulting from MP hyperextension injury of left digits with proximal phalanx fracture of the small finger.  Without skilled OT services, Nancy Richard  is at risk of failure of healing of the proximal phalanx fracture, persistent pain from strain to the interossei and continued limited  range of motion which will limit Nancy Richard's ability to complete ADLs at her optimal level.  ?  Was a custom fabricated splint required on patient's initial visit?:  No  ?  Rehab Potential: Good  ?  Patient demonstrates limitations which require skilled OT intervention to achieve the following goals:  ?  OT Treatment Goals  Short Term Goals (# weeks (STG): 4) STG Outcomes   STG 1: Pt will be independent in a HEP for ROM exercises as prescribed by OT. Outcome 1: New?   STG 2: Pt will be independent with soft tissue mobilization/desensitization of left hand. Outcome 2: New?   STG 3: Pt will be able to demonstrate light tips to palm flexion of left digits to increase ease with small object retention. Outcome 3: New?   STG 4: Pt will be able to use left hand to wash her hair or bathing. Outcome 4: New   STG 5: Pt will be able to complete light food prep activities with left hand assisting with minimal difficulty Outcome 5: New   ? ?   Long Term Goals (# weeks (LTG): 12) LTG Outcomes   LTG 1: Pt will be independent with conditioning HEP. Outcome 1: New?   LTG 2: Pt will be able to open a jar with left hand assisting with minimal difficulty Outcome 2: New?   LTG 3: Pt will be able to complete yard work tasks with left hand assisting with minimal difficulty.? Outcome 3: New?   LTG 4: Pt will be able to lift a heavy grocery bag with left hand assisting with minimal difficulty Outcome 4: New?   LTG 5: Pt will demonstrate left grip 75% of right to increase ease with heavy lifting for home care activities. Outcome 5: New   ?  ?  PLAN:  Treatment will focus on  protection of healing structures/tissues, restoration of tissue length and tissue extensibility, improving functional ROM, improving functional conditioning of left hand and activity tolerance for optimal ADL function.   Treatment Interventions CPT codes: Therapeutic exercises/Home Program [phone]; Manual therapy/Mobilization 97140; Therapeutic activities 97530;  Neuromuscular re-education/Balance 44010; ADL training 27253; Cold Laser [id number]; E-stim / HVPC [id number]; Fluidotherapy [id number]; Paraffin 66440; Ultrasound [id number]   ?  ?  Number of visits: 12   Duration: 12 weeks, or sooner if discharge criteria are met.   Certification Period:  From: 03/20/2021  Certified to (Recert or Discharge due): 06/18/2021   ?  Thank you for this referral.  ?  Therapist/License #: Charlott Rakes, OTR/L, [place] #[id number]  _____________________________________________________________________  ?  Please sign below if you agree with the provided plan and return to the fax number listed above. Modifications/precautions are appreciated.   ?  I certify the need for these services furnished under this plan of treatment and while under my care.  ?  Provider Signature: ________________________________________________________  Referring Provider: [name], PA-C  ?  Date: ___________________________________________________________________  ?  *This note was dictated using voice recognition software. If you have any questions concerning the content, please call our office at [phone] .*   ?  Untimed minutes: 15  Timed minutes: 30  TOTAL minutes: 45  ?  $ OT Eval - High complexity (CPT T3907887): Untimed single unit  $ Ther exercises (CPT 97110): 2 units

## 2021-03-20 NOTE — Plan of Care (Signed)
[place] [facility]  [facility] REHABILITATION SERVICES  [address]  [place] [address]  Dept: [phone]  Fax: [phone]  Loc: [phone]    Occupational Therapy Evaluation & Initial Plan of Care  [facility] REHABILITATION SERVICES    Patient name: Nancy Richard NGEXB'M Date: 03/20/2021   Date of Birth: 1956-02-09 Referring Provider: [name], PA-C   Visits from [facility]:    PCP: Leeanne Mannan III, MD   Date of Service: [facility] Date: 03/20/2021 - 03/20/2021 MRN: [id number]   No data recorded  Treatment Dx: Treatment Dx: Edema-Hand; Muscle Weakness; Pain in-Hand; Pain in joint-Hand; Stiffness in joint-Hand Gender: female    Onset Date: 02/17/2021  Referral Date: 03/08/2021 Payor: [employer] / Plan: [employer] / Product Type: *No Product type* /    Certified to (Recert or Discharge due): 06/18/2021 Treatment Time: 1027-2536        Chief Complaint: Pt reports reduction in functionality and effective use of her left hand following a forceful hyperextension injury of left digits resulting in fractures of the left small finger proximal phalanx.   Pain Level: Pt reports pain in left palm, MP pad and proximal phalanges of index, middle, ring and small fingers.  Pt describes pain as ranging from aching pain to a sharp stabbing pain.    Current Precautions: Narrative: Healing left fifth proximal phalanx fracture (4 weeks at time of evaluation)    History/Subjective:   Mechanism of Injury: Patient tripped in her yard over a small stone wall landing on her left digits forcibly hyperextending them at the MP joints.  Patient states that one of the fingers had to be realigned manually which she did herself, but was unsure which finger it was at the time of her ED visit.    Date of Injury/Onset of problem: 02/17/2021. Surgery date: N/A.  In the ED.  Patient was placed in a gutter splint which patient describes as being in the safe position with MPs in flexion and IP's in extension.  She wore this for a  while, but then independently removed it and begin doing her range of motion exercises out of fear that she would lose range of motion in her fingers.      Surgical History: No past surgical history on file.    Related Medical History: No past medical history on file.    Prior Treatment/Education and Results: Patient was seen in the ED on 02/17/2021 for assessment following her injury.  She was seen by Ortho on 03/08/2021 for reassessment and referred to occupational therapy at that time.  Pt has received no Occupational Therapy services for this dx.    Functional ADL Limitations: Currently, Pt has difficulty using her left hand for gripping, pinching, small object manipulation and retention.  Patient has difficulty with weightbearing through the palm and pressing to her fingertips.  This is restricting patient's ability to complete all self-care, home care and driving activities.  Prior Level of Function: Prior to onset of left hand hyperextension injury resulting in fracture of left small proximal phalanx base, Pt was able to use her left hand for self care and home care activity with no difficulty or limitations.   Employment status:  Pt is a retired [employer] fund[employer].    Driving Status: Modified independent.  Limited left hand use.    Objective:  Pt is a left dominant female that presents today with no splint support or soft support to the left digits or hand.  Patient presents unaccompanied today  to her evaluation.  Posture: Pt demonstrates very minimal protective posture when examining/treating her left hand.  Skin/Soft tissue: Pt's skin is warm and dry to touch.  No open areas or bruising noted in the left palm or digits.  Patient has IP joint capsule tightness of left digits.  Patient has mild MP capsular tightness of ring and small fingers coming into flexion.  Location of pain: Pt reports pain in through the left palm, MP pads of index, middle, ring and small fingers and proximal phalanges to the PIP joints of left  digits.  Localized tenderness: Left palm (volar and dorsal) and MP pads.  Edema: Very minimal in left hand.  Patient states that her hand feels fat.  Sensation: Patient denies altered sensation in left hand/digits.       Measurements and Observations:   Date 03/20/2021     Strength Right/Left      Grip Strength   NT     Lateral Pinch Strength   NT     Range of Motion Right/Left      Thumb      Opposition to small finger MP pad 0 cm     Finger       Index MP  0/70     Index PIP  0/106     Index DIP  0/56           Middle MP  0/65     Middle PIP  0/111     Middle DIP  0/50           Ring MP  0/51     Ring PIP  0/109     Ring DIP  0/45           Small MP  0/57     Small PIP  0/81     Small DIP  0/40     Tip to DPC (cm gap)      D2 1.0 cm     D3 1.5 cm     D4 1.5 cm     D5 2.0 cm         PSFS--Patient Specific Functional Scale 0 - 10  (0 = Unable to perform activity, 10 = Able to perform activity at the same level as before injury or problem)  Activity Initial 03/20/2021 Current  Discharge    1.  Open a jar with left hand assisting  0     2.  Lifting grocery bag with left hand  0     3.  Complete yard work with left hand assisting  0     Total  0     Total score = sum of the activity scores/number of activities  Minimum detectable change (90%CI) for average score = 2 points  Minimum detectable change (90%CI) for single activity score = 3 points  PSFS developed by: Jake Seats., & Binkley, J. (1995).     Treatment this day:   Evaluation completed.   Dynamic warm up completed to left hand as Pt completed therapist guided AROM of hand, wrist and forearm while in Fluidotherapy x10 min.  Therapeutic exercise completed today for digit passive range of motion followed by digit active range of motion with written home program provided to patient for comprehensive digit range of motion exercises.  Instruction also provided to the patient for soft tissue mobilization to left palm/hand.    ASSESSMENT:    Reason for Referral to Occupational Therapy: Evaluation and treatment  for stated dx.  Patient has severely limited function of her left dominant hand following hyperextension injury index, middle, ring and small digits resulting in fracture of the proximal phalanx of the small finger.  Patient has suffered a significant sprain to her interossei which makes decrease pressure at the tips of her digits or isolated MP flexion very uncomfortable.  Pain with activity has produced disuse deconditioning of the left hand.  OT feels that once patient restores digit range of motion and flexibility, her pain is going to subside significantly, but she will still have discomfort during deconditioning due to strain to her interossei.  Patient likely has thickening of the volar MP joint capsules from hyperextension injury which is giving the patient a feeling of thickness with MP flexion.  This resistance will also strain the interossei as they are the primary flexors of the MP joints.  Patient's current symptoms, following onset of MP hyperextension injury of left digits leading to proximal phalanx fracture of the small finger, are having a negative effect on completion of patient's ADLs.  Patient is an excellent candidate for skilled OT services to assist with symptom management and restoration of optimal achievable ADL function.    Impressions/Justification for skilled intervention: Drema Eddington  requires skilled OT services to address functional limitations resulting from MP hyperextension injury of left digits with proximal phalanx fracture of the small finger.  Without skilled OT services, Kielyn Kardell  is at risk of failure of healing of the proximal phalanx fracture, persistent pain from strain to the interossei and continued limited range of motion which will limit Shonika Kolasinski Selner's ability to complete ADLs at her optimal level.    Was a custom fabricated splint required on patient's initial visit?:   No    Rehab Potential: Good    Patient demonstrates limitations which require skilled OT intervention to achieve the following goals:    OT Treatment Goals  Short Term Goals (# weeks (STG): 4) STG Outcomes   STG 1: Pt will be independent in a HEP for ROM exercises as prescribed by OT. Outcome 1: New    STG 2: Pt will be independent with soft tissue mobilization/desensitization of left hand. Outcome 2: New    STG 3: Pt will be able to demonstrate light tips to palm flexion of left digits to increase ease with small object retention. Outcome 3: New    STG 4: Pt will be able to use left hand to wash her hair or bathing. Outcome 4: New   STG 5: Pt will be able to complete light food prep activities with left hand assisting with minimal difficulty Outcome 5: New         Long Term Goals (# weeks (LTG): 12) LTG Outcomes   LTG 1: Pt will be independent with conditioning HEP. Outcome 1: New    LTG 2: Pt will be able to open a jar with left hand assisting with minimal difficulty Outcome 2: New    LTG 3: Pt will be able to complete yard work tasks with left hand assisting with minimal difficulty.  Outcome 3: New    LTG 4: Pt will be able to lift a heavy grocery bag with left hand assisting with minimal difficulty Outcome 4: New    LTG 5: Pt will demonstrate left grip 75% of right to increase ease with heavy lifting for home care activities. Outcome 5: New       PLAN:  Treatment will focus on protection of  healing structures/tissues, restoration of tissue length and tissue extensibility, improving functional ROM, improving functional conditioning of left hand and activity tolerance for optimal ADL function.   Treatment Interventions CPT codes: Therapeutic exercises/Home Program [phone]; Manual therapy/Mobilization 97140; Therapeutic activities 97530; Neuromuscular re-education/Balance 60454; ADL training 09811; Cold Laser [id number]; E-stim / HVPC [id number]; Fluidotherapy [id number]; Paraffin 91478; Ultrasound Q330749       Number of visits: 12    Duration: 12 weeks, or sooner if discharge criteria are met.   Certification Period:  From: 03/20/2021  Certified to (Recert or Discharge due): 06/18/2021     Thank you for this referral.    Therapist/License #: Charlott Rakes, OTR/L, CHT [phone]  _____________________________________________________________________    Please sign below if you agree with the provided plan and return to the fax number listed above. Modifications/precautions are appreciated.     I certify the need for these services furnished under this plan of treatment and while under my care.    Provider Signature: ________________________________________________________  Referring Provider: [name], PA-C    Date: ___________________________________________________________________    *This note was dictated using voice recognition software. If you have any questions concerning the content, please call our office at [phone] .*     Untimed minutes: 15  Timed minutes: 30  TOTAL minutes: 45    $ OT Eval - High complexity (CPT T3907887): Untimed single unit  $ Ther exercises (CPT 97110): 2 units

## 2021-04-04 ENCOUNTER — Institutional Professional Consult (permissible substitution): Admit: 2021-04-04 | Payer: PRIVATE HEALTH INSURANCE | Primary: MD

## 2021-04-04 DIAGNOSIS — S62617A Displaced fracture of proximal phalanx of left little finger, initial encounter for closed fracture: Secondary | ICD-10-CM

## 2021-04-04 NOTE — Treatment Summary (Signed)
Occupational Therapy Daily Note    Patient name: Nancy Richard Date: 04/04/2021   Date of Birth: March 16, 1956 Referring Provider: [name], PA-C   Gender: female  PCP: Leeanne Mannan III, MD   Primary/Referral Dx: [phone]) - Closed displaced fracture of proximal phalanx of left little finger, initial encounter MRN: [id number]   Onset Date: 02/17/2021  Referral Date: 03/08/2021 Treatment Time: 9147-8295    Certified to (Recert or Discharge due): 06/18/2021   Payor: [employer] / Plan: [employer] / Product Type: *No Product type* /       Precautions: Narrative: Healing left fifth proximal phalanx fracture (4 weeks at time of evaluation)    SUBJECTIVE:  Patient states that her exercises are going well.  She feels like she is making steady progress.  Pain Level:  Pt reports pain with passive extension of the ring and middle fingers at the MP joints.  Response to HEP: Pt states that HEP is going very well.   Status of functional activities since our last session: Patient using her left hand to hold her cell phone and car keys.    OBJECTIVE:  Treatment this day:   Dynamic warm up of the left hand in the fluidotherapy machine x10 minutes.  Manual therapy for soft tissue mobilization to left palm and digits.  Grade 2 AP glide of middle, ring and small finger IP joints to loosen joint capsules prior to therapeutic exercise.  Therapeutic exercise completed for digit passive range of motion emphasizing intrinsic minus stretch followed by composite flexion stretch.  Passive extension stretch completed to left digits.  Active range of motion completed to left digits.  Paper towel crumpling task initiated today to encourage isolated IP joint motion as well as isolated digit motion.  Cueing required to correctly complete exercises or progression of exercises today with instruction: Paper towel crumpling exercise.  Was a new HEP provided for new or changed exercise program: Yes-verbal for paper towel  exercise.  New measurements today: At the end of treatment today, patient able to lightly touch her small finger tip to palm and demonstrated full active extension of left digits.    ASSESSMENT:   Excellent improvement with digit active range of motion and flexibility.  Patient responded very well to dynamic warm up in the fluidotherapy machine, soft tissue mobilization for manual therapy, joint mobilization and stretching exercises.  Tolerance to today's session: Excellent.    Function of patient's left hand is improving, but still limited.  Pt's function and progress continues to be restricted by the ability to strain the intrinsics of the left hand producing pain at the MP joints with gripping..  Tx today necessitated skilled Occupational therapist for constant assessment of tolerance of exercise prescription, cueing to correct form, adjust intensity, duration, frequency and repetitions appropriately based on patient's abilities and functional goals, and to educate on avoidance of common compensations to maximize effectiveness of tx.   Competency of HEP: Good    PLAN:   Pt to continue with skilled OT services to improve function of left hand through use of modalities, manual therapy and therapeutic exercise for the improvement of AROM, reduce edema, stiffness and reduce pain in left hand.    Charlott Rakes, OTR/L,CHT [phone]       Timed minutes: 45  TOTAL minutes: 45    $ Ther exercises (CPT 97110): 2 units  $ Manual therapy (CPT 97140): 1 unit

## 2021-04-07 IMAGING — MG DIGITAL DIAGNOSTIC UNILATERAL LEFT MAMMOGRAM WITH TOMO AND CAD
4 series · 4 of 12 positions shown · non-contrast
Comparison: Previous exam(s).

CLINICAL DATA: Six-month follow-up of probable fat necrosis in the
medial left breast.

EXAM:
DIGITAL DIAGNOSTIC LEFT MAMMOGRAM WITH CAD AND TOMO
ULTRASOUND LEFT BREAST

[L MLO synth-2D]
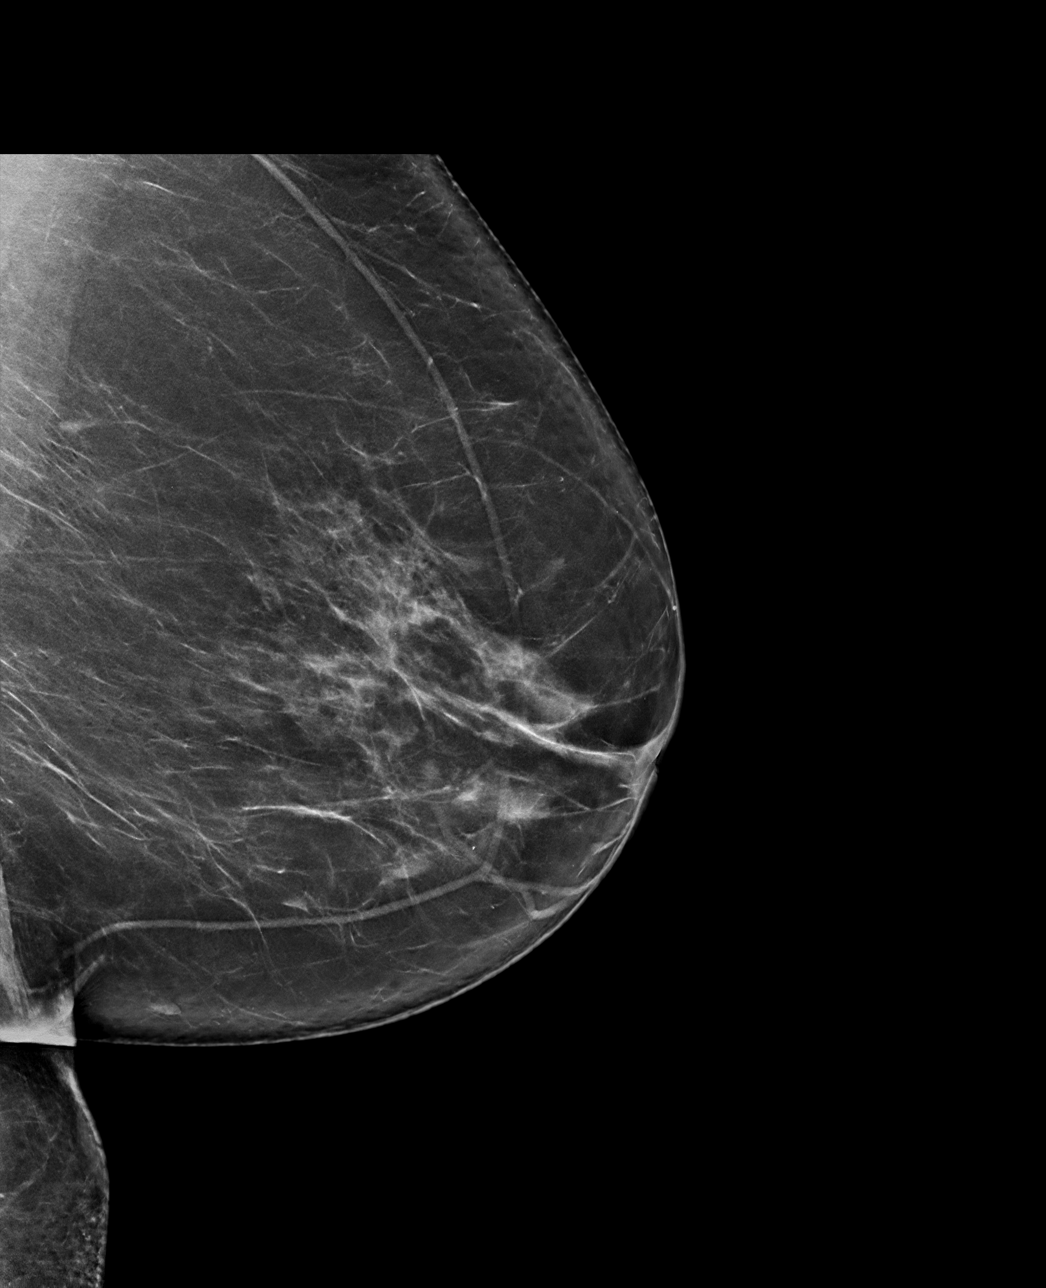

[L CC synth-2D]
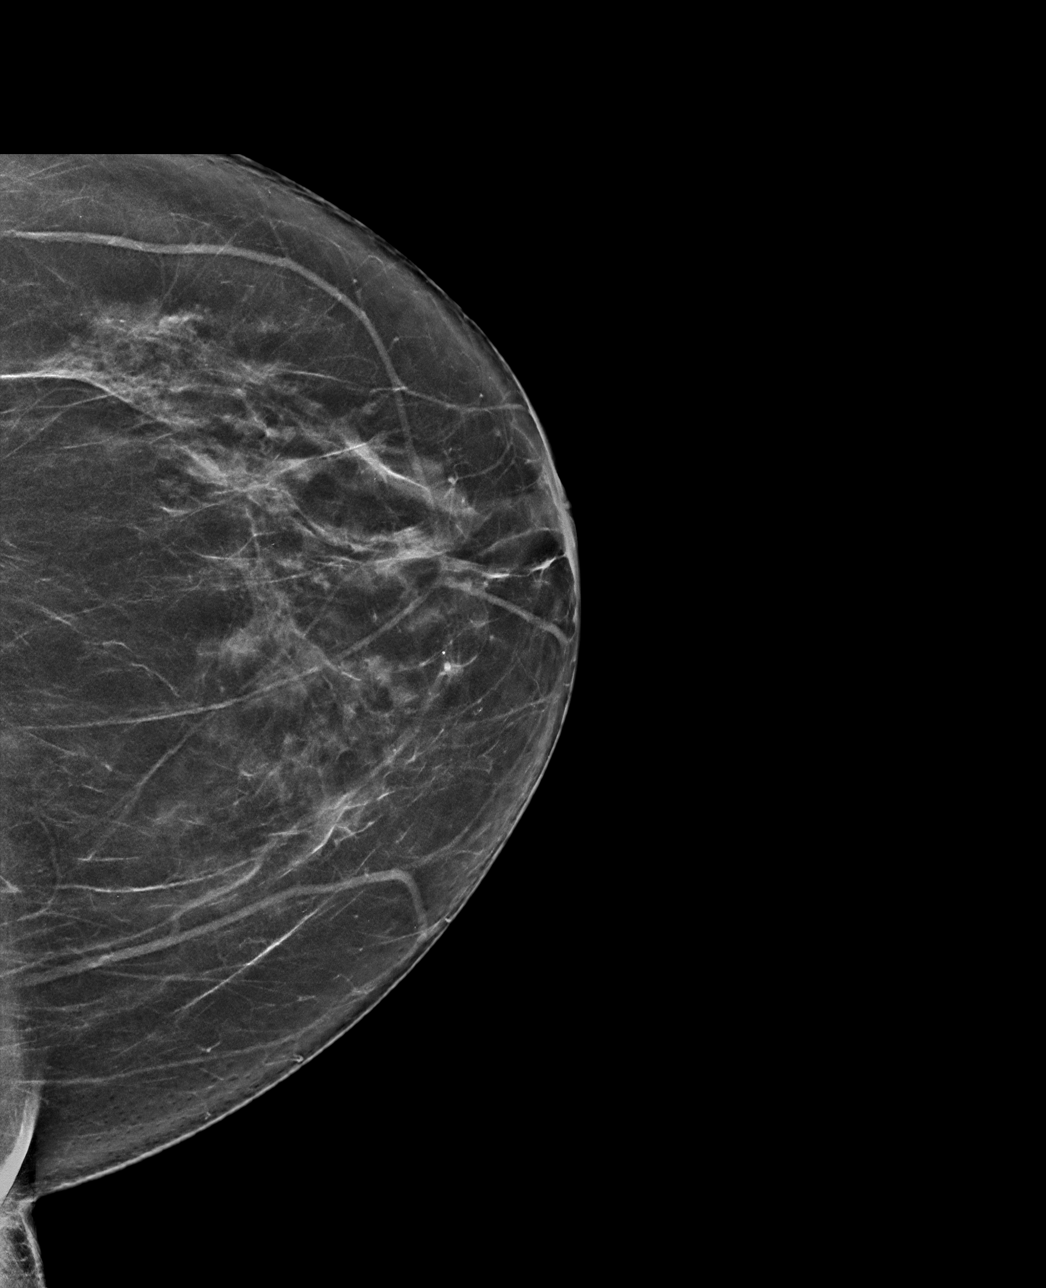

[L MLO tomo · tomo slice 43/84.0]
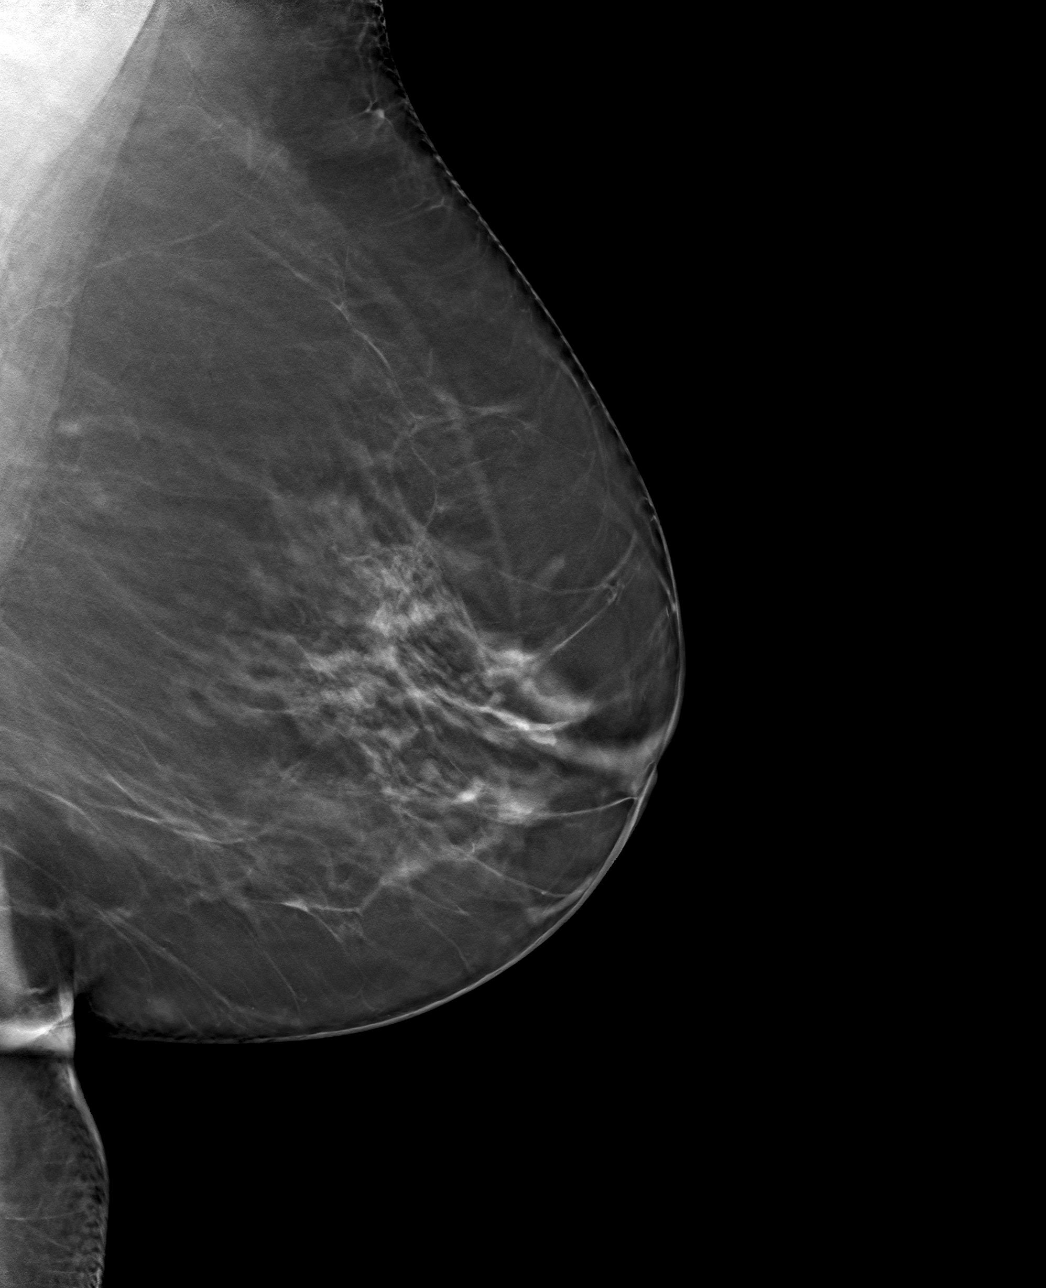

[L CC tomo · tomo slice 37/73.0]
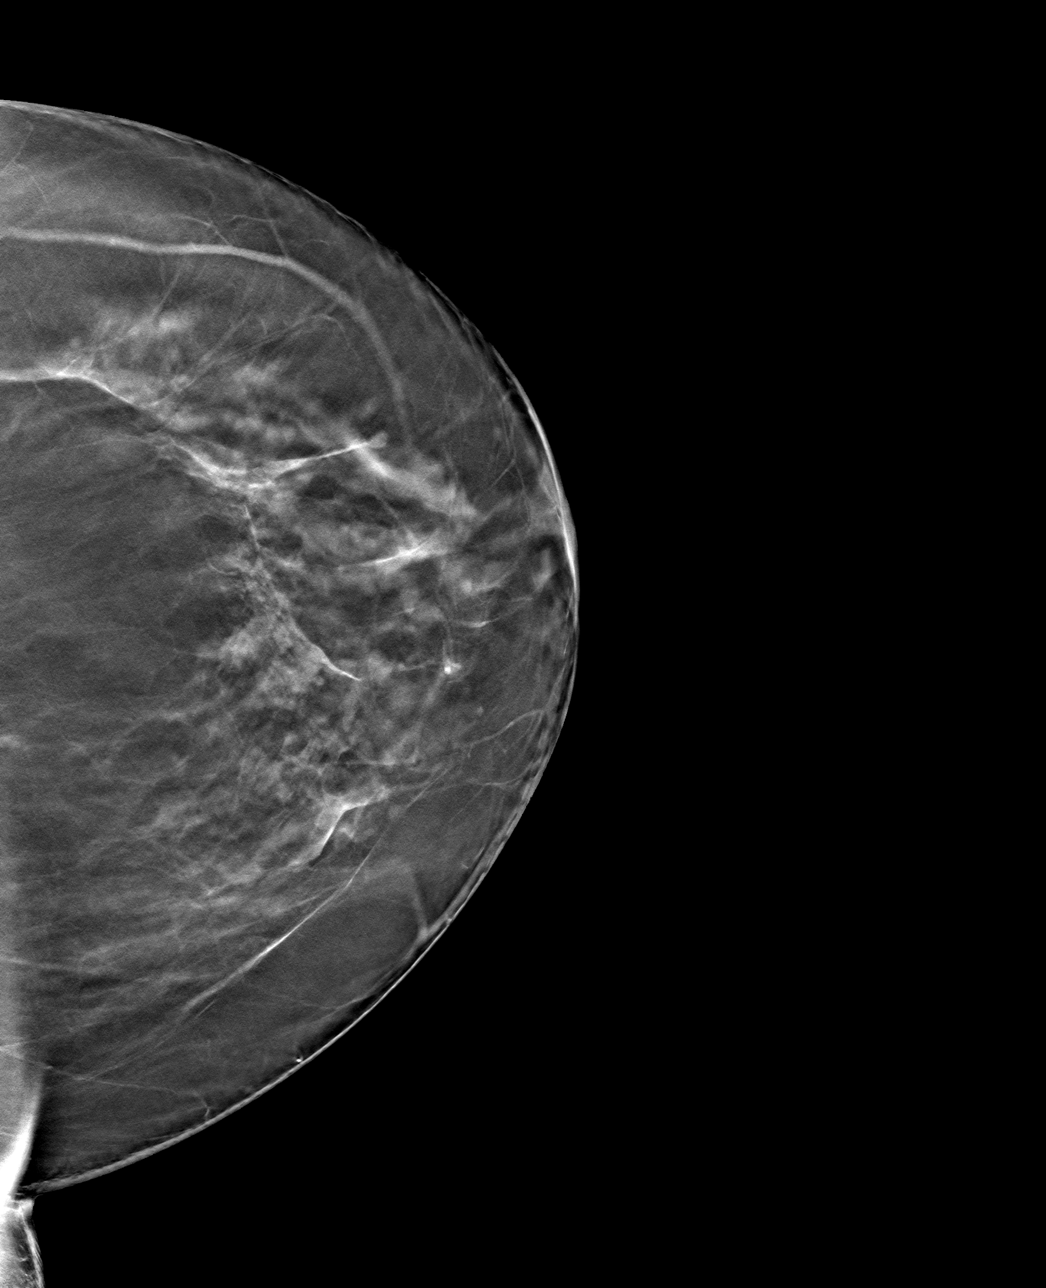

[4 of 12 positions shown; findings below may reference images not displayed]

ACR Breast Density Category b: There are scattered areas of
fibroglandular density.
FINDINGS: The mass in the medial left breast has resolved. The patient is
noted to have several skin lesions/moles along her inferolateral
left breast on physical exam which correlate with some nodularity in
this region on mammogram. No other suspicious mammographic findings.

Mammographic images were processed with CAD.

On physical exam, no suspicious lumps are identified. Several moles
are identified along the undersurface of the left breast.

Targeted ultrasound is performed, showing resolution of the
previously identified 9 o'clock left breast mass.
IMPRESSION: Resolution of the previously identified 9 o'clock left breast mass,
likely representing fat necrosis on the previous study. No other
suspicious findings or changes.

RECOMMENDATION:
Annual screening mammography in July 2019.

I have discussed the findings and recommendations with the patient.
Results were also provided in writing at the conclusion of the
visit. If applicable, a reminder letter will be sent to the patient
regarding the next appointment.

BI-RADS CATEGORY  2: Benign.

## 2021-04-09 ENCOUNTER — Institutional Professional Consult (permissible substitution): Admit: 2021-04-09 | Payer: PRIVATE HEALTH INSURANCE | Primary: MD

## 2021-04-09 NOTE — Treatment Summary (Signed)
Occupational Therapy Daily Note    Patient name: Nancy Richard Date: 04/09/2021   Date of Birth: [birth year] Referring Provider: [name], PA-C   Gender: female  PCP: Leeanne Mannan III, MD   Primary/Referral Dx: [phone]) - Closed displaced fracture of proximal phalanx of left little finger, initial encounter MRN: [id number]   Onset Date: 02/17/2021  Referral Date: 03/08/2021 Treatment Time: 9147-8295    Certified to (Recert or Discharge due): 06/18/2021   Payor: [employer] / Plan: [employer] / Product Type: *No Product type* /       Precautions: Narrative: Healing left fifth proximal phalanx fracture (4 weeks at time of evaluation)    SUBJECTIVE:  Patient pleased with the progress that she has made between her last therapy session and today with regards to range of motion of her digits and pain reduction with active use.  Pain Level:  Pt reports pain with passive extension of the ring and middle fingers at the MP joints.  Pain level is dependent on aggressiveness of stretch.  Response to HEP: Pt states that HEP is going very well.  Much improved flexibility and range of motion between last therapy session and today.  Status of functional activities since our last session: Patient reports that she was able to use her left hand to assist with moving a picnic table.    OBJECTIVE:  Treatment this day:   Dynamic warm up of the left hand in the fluidotherapy machine x10 minutes.  Manual therapy for soft tissue mobilization to left palm and digits.  Grade 2 AP glide of middle, ring and small finger IP joints to loosen joint capsules prior to therapeutic exercise.  Therapeutic exercise completed for digit passive range of motion emphasizing intrinsic minus stretch followed by composite flexion stretch.  Passive extension stretch completed to left digits.  Active range of motion completed to left digits.  In hand manipulation task completed today with patient picking up and laying down 5 nickels from  left hand.  Cueing required to correctly complete exercises or progression of exercises today with instruction: In hand manipulation with coins.  Was a new HEP provided for new or changed exercise program: Yes-verbal for in hand manipulation task.    New measurements today:   D2) MP 0/78, PIP 0/112, DIP 0/60  D3) MP 0/78, PIP 0/112, DIP 0/64  D4) MP 0/64, PIP 0/112, DIP 0/54  D5) MP 0/66, PIP 0/100, DIP 0/67    ASSESSMENT:   Patient continues to demonstrate excellent improvement with digit active range of motion and flexibility.  She is improving between each therapy session.  Is much less tender for soft tissue mobilization of the left hand.  Patient continues to respond very well to dynamic warm up in the fluidotherapy machine, soft tissue mobilization for manual therapy, joint mobilization and stretching exercises.  Tolerance to today's session: Excellent.    Function of patient's left hand is improving, but still limited.  Patient's left hand function is improving as indicated by her ability and willingness to assist in the lifting of a picnic table.  Her in hand manipulation function is substantially improved now that her range of motion is good and flexibility is improving within her available range of motion.  Tx today necessitated skilled Occupational therapist for constant assessment of tolerance of exercise prescription, cueing to correct form, adjust intensity, duration, frequency and repetitions appropriately based on patient's abilities and functional goals, and to educate on avoidance of common compensations to maximize effectiveness of  tx.   Competency of HEP: Good    PLAN:   Pt to continue with skilled OT services to improve function of left hand through use of modalities, manual therapy and therapeutic exercise for the improvement of AROM, reduce edema, stiffness and reduce pain in left hand.    Charlott Rakes, OTR/L,CHT [phone]       Timed minutes: 45  TOTAL minutes: 45    $ Ther exercises (CPT  97110): 2 units  $ Manual therapy (CPT 97140): 1 unit

## 2021-04-12 ENCOUNTER — Institutional Professional Consult (permissible substitution): Admit: 2021-04-12 | Payer: PRIVATE HEALTH INSURANCE | Primary: MD

## 2021-04-12 NOTE — Treatment Summary (Signed)
Occupational Therapy Daily Note    Patient name: Nancy Richard Date: 04/12/2021   Date of Birth: Oct 21, 1955 Referring Provider: [name], PA-C   Gender: female  PCP: Leeanne Mannan III, MD   Primary/Referral Dx: [phone]) - Closed displaced fracture of proximal phalanx of left little finger, initial encounter MRN: [id number]   Onset Date: 02/17/2021  Referral Date: 03/08/2021 Treatment Time: 0750-0830    Certified to (Recert or Discharge due): 06/18/2021   Payor: [employer] / Plan: [employer] / Product Type: *No Product type* /       Precautions: Narrative: Healing left fifth proximal phalanx fracture (4 weeks at time of evaluation)    SUBJECTIVE:  Patient pleased with the progress that she is seeing with her left hand for functionality.  Pain Level: Minimal with range of motion exercises for left digits.  This includes passive range of motion.  Response to HEP: Pt states that HEP is going very well.  Flexibility consistently improves on a daily basis.  Status of functional activities since our last session: Patient using her left hand more spontaneously for gripping and lifting ADLs.    OBJECTIVE:  Treatment this day:   Dynamic warm up of the left hand in the fluidotherapy machine x10 minutes.  Manual therapy for soft tissue mobilization to left palm and digits.  Grade 2 AP glide of middle, ring and small finger IP joints to loosen joint capsules prior to therapeutic exercise.  Therapeutic exercise completed for digit passive range of motion emphasizing intrinsic minus stretch followed by composite flexion stretch.  Passive extension stretch completed to left digits.  Active range of motion completed to left digits.  Conditioning exercises initiated to left hand with pink Theraputty for grip and resisted manipulation.  Patient able to remove 5 nickels from pink Theraputty x3 with good fatigue and no complaints of pain.  Cueing required to correctly complete exercises or progression of  exercises today with instruction: Theraputty conditioning exercises.  Was a new HEP provided for new or changed exercise program: Yes-Theraputty exercises.  New measurements today: No new measurements taken today.  At the end of treatment today, patient was able to demonstrate terminal flexion of the digits 2-5.    ASSESSMENT:   Patient demonstrating consistent improvement with digit active range of motion and flexibility.  She continues to improve with each therapy session.  Excellent tolerance to new conditioning exercises for the left hand.  Patient understands new home exercise program for Theraputty exercises.  Tolerance to today's session: Excellent.    Function of patient's left hand is improving, but still limited.  Patient's left hand function is improving for spontaneous use of the left hand for gripping and lifting around the home.  Tx today necessitated skilled Occupational therapist for constant assessment of tolerance of exercise prescription, cueing to correct form, adjust intensity, duration, frequency and repetitions appropriately based on patient's abilities and functional goals, and to educate on avoidance of common compensations to maximize effectiveness of tx.   Competency of HEP: Good    PLAN:   Pt to continue with skilled OT services to improve function of left hand through use of modalities, manual therapy and therapeutic exercise for the improvement of AROM, reduce edema, stiffness and reduce pain in left hand.    Charlott Rakes, OTR/L,CHT [phone]       Timed minutes: 40  TOTAL minutes: 40    $ Ther exercises (CPT 97110): 2 units  $ Manual therapy (CPT 97140): 1 unit

## 2021-04-16 ENCOUNTER — Institutional Professional Consult (permissible substitution): Admit: 2021-04-16 | Payer: PRIVATE HEALTH INSURANCE | Primary: MD

## 2021-04-16 NOTE — Treatment Summary (Signed)
Occupational Therapy Daily Note    Patient name: Nancy Richard Date: 04/16/2021   Date of Birth: 10-Nov-1955 Referring Provider: [name], PA-C   Gender: female  PCP: Leeanne Mannan III, MD   Primary/Referral Dx: [phone]) - Closed displaced fracture of proximal phalanx of left little finger, initial encounter MRN: [id number]   Onset Date: 02/17/2021  Referral Date: 03/08/2021 Treatment Time: 9147-8295    Certified to (Recert or Discharge due): 06/18/2021   Payor: [employer] / Plan: [employer] / Product Type: *No Product type* /       Precautions: Narrative: Healing left fifth proximal phalanx fracture (4 weeks at time of evaluation)    SUBJECTIVE:  Pain Level: Minimal with range of motion exercises for left digits.  This includes passive range of motion.  Response to HEP: Pt states that HEP is going very well.  Theraputty exercises are going well for grip and resisted manipulation.  Status of functional activities since our last session: Patient using her left hand more spontaneously for gripping and lifting ADLs.  Patient states that she went camping over the weekend and did a significant amount of cooking with the assistance of the left hand without flareup of discomfort.    OBJECTIVE:  Treatment this day:   Dynamic warm up of the left hand in the fluidotherapy machine x10 minutes.  Manual therapy for soft tissue mobilization to left palm and digits.  Grade 2 AP glide of middle, ring and small finger IP joints to loosen joint capsules prior to therapeutic exercise.  Therapeutic exercise completed for digit passive range of motion emphasizing intrinsic minus stretch followed by composite flexion stretch.  Passive extension stretch completed to left digits.  Active range of motion completed to left digits.  Conditioning exercises initiated to left wrist and forearm.  Wrist conditioning exercises initiated today with orange Thera-Band and forearm rotation PRE's initiated today against a 1 pound  weight at the end of a 12 inch threaded bar for resisted pronation and supination.  Patient completed pleated 3 sets of 10 repetitions for each exercise.  Cueing required to correctly complete exercises or progression of exercises today with instruction: Wrist and forearm conditioning exercises.  Was a new HEP provided for new or changed exercise program: Yes-wrist and forearm conditioning exercises.  New measurements today: No new measurements taken today.  At the end of treatment today, patient was able to demonstrate terminal flexion of the digits 2-5.    ASSESSMENT:   Patient demonstrating consistent improvement with digit active range of motion and flexibility.  Patient is doing well maintaining her range of motion between her exercise sessions and at night.  She continues to improve with digit flexibility with each therapy session.  Excellent tolerance to new conditioning exercises for the left wrist and forearm.  Tolerance to today's session: Excellent.    Function of patient's left hand is improving, but still limited.  Patient doing much better with home care activities including cooking/meal prep.  Patient's left hand function is improving for spontaneous use of the left hand for gripping and lifting around the home.  Tx today necessitated skilled Occupational therapist for constant assessment of tolerance of exercise prescription, cueing to correct form, adjust intensity, duration, frequency and repetitions appropriately based on patient's abilities and functional goals, and to educate on avoidance of common compensations to maximize effectiveness of tx.   Competency of HEP: Good    PLAN:   Pt to continue with skilled OT services to improve function of  left hand through use of modalities, manual therapy and therapeutic exercise for the improvement of AROM, reduce edema, stiffness and reduce pain in left hand.    Charlott Rakes, OTR/L,CHT [phone]       Timed minutes: 45  TOTAL minutes: 45    $ Ther exercises  (CPT 97110): 2 units  $ Manual therapy (CPT 97140): 1 unit

## 2021-04-19 ENCOUNTER — Institutional Professional Consult (permissible substitution): Admit: 2021-04-19 | Payer: PRIVATE HEALTH INSURANCE | Primary: MD

## 2021-04-19 ENCOUNTER — Ambulatory Visit: Admit: 2021-04-19 | Payer: PRIVATE HEALTH INSURANCE | Attending: PA-C | Primary: MD

## 2021-04-19 DIAGNOSIS — S62667D Nondisplaced fracture of distal phalanx of left little finger, subsequent encounter for fracture with routine healing: Secondary | ICD-10-CM

## 2021-04-19 NOTE — Discharge Summary (Signed)
[facility]  [facility]  [address]  [place] [address]  Dept: [phone]  Fax: [phone]  Loc: [phone]    Occupational Therapy Discharge Summary  [facility] RE[facility]IL[facility]    Patient name: Nancy Richard Date: 04/19/2021   Date of Birth: 10-Jul-1956 Referring Provider: [name], PA-C   Gender: female  PCP: Leeanne Mannan III, MD   Primary/Referral Dx: [phone]) - Closed displaced fracture of proximal phalanx of left little finger, initial encounter MRN: [id number]   Onset Date: 02/17/2021  Referral Date: 03/08/2021 Treatment Time: 0272-5366    Certified to (Recert or Discharge due): 06/18/2021   Payor: [employer] / Plan: [employer] / Product Type: *No Product type* /       Subjective:  Pt reports that she is using her left hand for most activities.  She is using pain as a guide.  Patient is able to lift a gallon of milk and is able to lift a pan off the stove.  She is able to produce a full fist.  She has stiffness in her left digits, but is able to work that out with her exercises.  Patient feels that she can continue with self-directed home exercise program at this time and would like to make today her last therapy session.  Patient states that her left hand is functioning at approximately 60% compared to her right hand.  Current top concerns for the patient: None       Visits from [facility]:  6    Cancellations: 0    No Shows: 0      Current Precautions: Narrative: Healing left fifth proximal phalanx fracture (4 weeks at time of evaluation)    Pain Level: Minimal with heavier activities and aggressive stretching in the left hand.    Objective:  Measurements and Observations:   Date 03/20/2021 ?04/19/2021 ?   Strength Right/Left ? ? ?   Grip Strength   NT  52/35 ?   Lateral Pinch Strength   NT ?16/16 ?   Range of Motion Right/Left ? ? ?   Thumb ? ? ?   Opposition to small finger MP pad 0 cm ? ?   Finger  ? ? ?   Index MP  0/70 ?0/79 ?   Index  PIP  0/106 ?0/112 ?   Index DIP  0/56 ?0/61 ?   ? ? ? ?   Middle MP  0/65 ?0/80 ?   Middle PIP  0/111 ?0/115 ?   Middle DIP  0/50 ?0/66 ?   ? ? ? ?   Ring MP  0/51 ?0/62 ?   Ring PIP  0/109 ?0/115 ?   Ring DIP  0/45 ?0/55 ?   ? ? ? ?   Small MP  0/57 ?0/65 ?   Small PIP  0/81 ?0/102 ?   Small DIP  0/40 ?0/66 ?   Tip to [facility] (cm gap) ? ? ?   D2 1.0 cm ?0 cm ?   D3 1.5 cm ?0 cm ?   D4 1.5 cm ?0 cm ?   D5 2.0 cm ?0 cm ?         PSFS--Patient Specific Functional Scale 0 - 10  (0 = Unable to perform activity, 10 = Able to perform activity at the same level as before injury or problem)  Activity Initial 03/20/2021 Discharge    1.  Open a jar with left hand assisting  0 ?6  2.  Lifting grocery bag with left hand  0 ?8   3.  Complete yard work with left hand assisting  0 ?7   Total  0 ?21     Total score = sum of the activity scores/number of activities  Minimum detectable change (90%CI) for average score = 2 points  Minimum detectable change (90%CI) for single activity score = 3 points  PSFS developed by: Jake Seats., & Binkley, J. (1995).     Treatment this day:   Dynamic warm up of the left hand in the fluidotherapy machine x10 minutes.  Manual therapy for soft tissue mobilization to left palm and digits.  Grade 2 AP glide of middle, ring and small finger IP joints to loosen joint capsules prior to therapeutic exercise.  Therapeutic exercise completed for digit passive range of motion emphasizing intrinsic minus stretch followed by composite flexion stretch.  Passive extension stretch completed to left digits.  Active range of motion completed to left digits for both flexion and extension.  Conditioning exercises completed by patient at home today.  Home exercise program reviewed with patient.  New measurements taken for discharge summary.    Assessment:  Pt has made progress with restoration of left digit active range of motion and has significantly improved her left grip strength.  Patient has  very little discomfort in the left hand for stretching and ADLs.  She can produce discomfort with heavier gripping, sustained gripping and aggressive stretching, but is able to moderate her activities to make these activities tolerable.  Patient is independent with her home exercise program and feels that she can continue with self-directed home program on her own.  Functionally, Pt is doing much better with self-care and home care ADLs, but still has difficulty with sustained, heavy gripping in the left hand.  Barriers to patient returning to full function include: None    Is the patient compliant with precautions and HEP: Yes    Goal reporting period: From: 03/20/2021  To: 04/19/2021    OT Treatment Goals   Short Term Goals (# weeks (STG): 4) STG Outcomes   STG 1: Pt will be independent in a HEP for ROM exercises as prescribed by OT. Outcome 1: Met   STG 2: Pt will be independent with soft tissue mobilization/desensitization of left hand. Outcome 2: Met   STG 3: Pt will be able to demonstrate light tips to palm flexion of left digits to increase ease with small object retention. Outcome 3: Met   STG 4: Pt will be able to use left hand to wash her hair or bathing. Outcome 4: Met   STG 5: Pt will be able to complete light food prep activities with left hand assisting with minimal difficulty Outcome 5: Met       Long Term Goals (# weeks (LTG): 12) LTG Outcomes   LTG 1: Pt will be independent with conditioning HEP. Outcome 1: Met   LTG 2: Pt will be able to open a jar with left hand assisting with minimal difficulty Outcome 2: Mostly met   LTG 3: Pt will be able to complete yard work tasks with left hand assisting with minimal difficulty.  Outcome 3: Mostly met   LTG 4: Pt will be able to lift a heavy grocery bag with left hand assisting with minimal difficulty Outcome 4: Met   LTG 5: Pt will demonstrate left grip 75% of right to increase ease with heavy lifting for home care activities. Outcome  5: Nearly met-67%          Events that have altered estimated progress towards goals: None    Rehab Potential: Excellent    Plan: Discontinue OT services.  Pt to continue with self directed HEP as instructed and follow up with referring provider if scheduled. OT services no longer indicated. Pt is welcome to telephone therapist if any clarification questions need to be answered.      Treatment Interventions:   Treatment Interventions CPT codes: N/A: Discharge Report     Thank you for this referral.    Charlott Rakes, OTR/L,CHT #[id number]    This discharge note has been provided as an update report only, and does not require a signature. Provider input for treatment modifications/precautions is appreciated.    *This note was dictated using voice recognition software. If you have any questions concerning the content, please call our office at [phone] .*        Timed minutes: 45  TOTAL minutes: 45    $ Ther exercises (CPT 97110): 2 units  $ Manual therapy (CPT 97140): 1 unit

## 2021-04-19 NOTE — Progress Notes (Signed)
Nancy Richard is a 65 y.o. female.    Chief Complaint   Patient presents with   . Follow-up     Left little finger fracture     HISTORY OF PRESENT ILLNESS     Nancy Richard is a 65 y.o. female, presents today for follow up evaluation of her left little finger fracture.  She has been participating with formal physical therapy.  She states that she is back to her usual activity.  She has been released from physical therapy.  Her pain has been well controlled.  Her range of motion has almost returned to normal.  She appears pleased with her outcome and results thus far.    REVIEW OF SYSTEMS     Review of Systems  PHYSICAL EXAM     There were no vitals taken for this visit.  There is no height or weight on file to calculate BMI.    Physical Exam  Vitals reviewed.   Musculoskeletal:      Left hand: No swelling or bony tenderness. Normal range of motion. Normal pulse.      Comments:   -Her distal neurovascular exam is grossly intact.     Red  ASSESSMENT        SNOMED CT(R)    1. Closed nondisplaced fracture of distal phalanx of left little finger with routine healing, subsequent encounter  CLOSED FRACTURE OF DISTAL PHALANX OF LITTLE FINGER         PLAN        The patient is doing very well.    My plan is to release her to an as-needed basis.  If she were to have an increase in pain or discomfort I would like to see her back in the clinic.  Otherwise, I am happy to see her with any further orthopedic needs that she may have.      The patient agrees with this plan.      All questions were solicited and answered.  I would be happy to see Nancy Richard back at any time with further questions or concerns.       [name], PA-C    This note was dictated using voice recognition software. If you have any questions concerning the content, please call our office at [phone].

## 2021-04-23 ENCOUNTER — Encounter: Payer: PRIVATE HEALTH INSURANCE | Primary: MD

## 2021-04-24 MED FILL — FINASTERIDE 1 MG TABLET: 1 mg | ORAL | 90 days supply | Qty: 30 | Fill #2

## 2021-04-26 ENCOUNTER — Encounter: Payer: PRIVATE HEALTH INSURANCE | Primary: MD

## 2021-05-28 ENCOUNTER — Other Ambulatory Visit: Admit: 2021-05-28 | Discharge: 2021-05-28 | Payer: PRIVATE HEALTH INSURANCE | Primary: MD

## 2021-05-28 DIAGNOSIS — E7889 Other lipoprotein metabolism disorders: Secondary | ICD-10-CM

## 2021-05-28 LAB — COMPREHENSIVE METABOLIC PANEL
ALT - Alanine Aminotransferase: 17 IU/L (ref 7–52)
AST - Aspartate Aminotransferase: 19 IU/L (ref 8–39)
Albumin/Globulin Ratio: 1.8 (ref >0.9)
Albumin: 4.2 g/dL (ref 3.5–5.0)
Alkaline Phosphatase: 41 IU/L (ref 34–104)
Anion Gap: 6 mmol/L (ref 4–13)
BUN: 15 mg/dL (ref 6–23)
Bilirubin Total: 0.6 mg/dL (ref 0.3–1.2)
CO2 - Carbon Dioxide: 27 mmol/L (ref 21–31)
Calcium: 9.4 mg/dL (ref 8.6–10.3)
Chloride: 105 mmol/L (ref 98–111)
Creatinine: 0.78 mg/dL (ref 0.55–1.10)
GFR Estimated Female: >60 mL/min/{1.73_m2} (ref >=60)
Globulin: 2.4 g/dL (ref 2.2–3.7)
Glucose: 87 mg/dL (ref 80–99)
Potassium: 4.2 mmol/L (ref 3.5–5.1)
Protein Total: 6.6 g/dL (ref 6.0–8.0)
Sodium: 138 mmol/L (ref 135–143)

## 2021-05-28 LAB — CORONARY RISK LIPID PANEL REFLEX DIRECT LDL
Cholesterol, HDL: 77 mg/dL (ref >=40)
Cholesterol: 217 mg/dL — ABNORMAL HIGH (ref <200)
LDL Calculated: 126 mg/dL — ABNORMAL HIGH (ref <100)
Triglyceride: 70 mg/dL (ref 30–149)

## 2021-05-28 LAB — 25-OH HYDROXY VITAMIN D, CALCIFEROL, TOTAL OF D2 & D3: Vitamin D 25-OH Total: 52.4 ng/mL (ref 30.0–100.0)

## 2021-06-18 MED ORDER — amoxicillin (AMOXIL) 500 MG capsule
500 | ORAL_CAPSULE | Freq: Three times a day (TID) | ORAL | 0 refills | Status: AC
Start: 2021-06-18 — End: ?
  Filled 2021-06-18: qty 21, 7d supply, fill #0

## 2021-07-02 MED ORDER — finasteride (PROPECIA) 1 mg tablet
1 | ORAL_TABLET | ORAL | 2 refills | Status: AC
Start: 2021-07-02 — End: ?

## 2021-07-03 MED ORDER — finasteride (PROPECIA) 1 mg tablet
1 | ORAL_TABLET | ORAL | 1 refills | Status: AC
Start: 2021-07-03 — End: ?
  Filled 2021-07-02: qty 30, 60d supply, fill #0
  Filled 2021-09-12: qty 30, 60d supply, fill #1

## 2021-08-15 ENCOUNTER — Ambulatory Visit (INDEPENDENT_AMBULATORY_CARE_PROVIDER_SITE_OTHER): Payer: Medicare PPO

## 2021-08-15 ENCOUNTER — Encounter: Payer: Self-pay | Admitting: Podiatry

## 2021-08-15 ENCOUNTER — Other Ambulatory Visit: Payer: Self-pay

## 2021-08-15 ENCOUNTER — Ambulatory Visit: Payer: Medicare PPO | Admitting: Podiatry

## 2021-08-15 DIAGNOSIS — M79674 Pain in right toe(s): Secondary | ICD-10-CM

## 2021-08-15 DIAGNOSIS — M21611 Bunion of right foot: Secondary | ICD-10-CM

## 2021-08-15 DIAGNOSIS — M21619 Bunion of unspecified foot: Secondary | ICD-10-CM

## 2021-08-15 DIAGNOSIS — L84 Corns and callosities: Secondary | ICD-10-CM

## 2021-08-15 NOTE — Progress Notes (Signed)
°  Subjective:  Patient ID: Samantha Larsen, female    DOB: 11-09-1955,   MRN: [id number]  Chief Complaint  Patient presents with   Callouses    Right 2nd toe ulcer or callus    65 y.o. female presents for right second toe corn that has been present for many months. She states she has started taking doxycycline she had from her husband which improved the redness and swelling. Denies redness and swelling today. Husband currently being treated for esophageal cancer . Denies any other pedal complaints. Denies n/v/f/c.   Past Medical History:  Diagnosis Date   Allergic rhinitis, unspecified    Arthritis    Asthma    Essential (primary) hypertension    Gastro-esophageal reflux disease with esophagitis    GERD (gastroesophageal reflux disease)    Hypercholesteremia    Hyperlipidemia    Hypertension    Obesity, unspecified    Pain in unspecified knee    Plantar fascial fibromatosis    Unspecified osteoarthritis, unspecified site     Objective:  Physical Exam: Vascular: DP/PT pulses 2/4 bilateral. CFT <3 seconds. Normal hair growth on digits. No edema.  Skin. No lacerations or abrasions bilateral feet. Cored hyperkeratotic lesion noted to medial second digit on right.  Musculoskeletal: MMT 5/5 bilateral lower extremities in DF, PF, Inversion and Eversion. Deceased ROM in DF of ankle joint. 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   Assessment:   1. Corns and callosity   2. Bunion, right foot      Plan:  Patient was evaluated and treated and all questions answered. -Discussed corns and calluses with patient and treatment options.  -Hyperkeratotic tissue was debrided with chisel without incident.  -Encouraged daily moisturizing -Discussed use of pumice stone -Toe cap provided  -Advised good supportive shoes and inserts -Xrays reviewed -Discussed HAV and treatment options;conservative and surgical management; risks, benefits, alternatives discussed.  All patient's questions answered. -Patient to return to office as needed or sooner if condition worsens.     Lorenda Peck, DPM

## 2021-08-16 ENCOUNTER — Other Ambulatory Visit: Payer: Self-pay | Admitting: Podiatry

## 2021-08-16 DIAGNOSIS — M21619 Bunion of unspecified foot: Secondary | ICD-10-CM

## 2021-11-16 MED FILL — FINASTERIDE 1 MG TABLET: 1 mg | ORAL | 90 days supply | Qty: 45 | Fill #0

## 2022-02-19 MED FILL — FINASTERIDE 1 MG TABLET: 1 mg | ORAL | 90 days supply | Qty: 45 | Fill #1

## 2022-04-08 ENCOUNTER — Other Ambulatory Visit: Admit: 2022-04-08 | Discharge: 2022-04-08 | Payer: PRIVATE HEALTH INSURANCE | Primary: MD

## 2022-04-08 DIAGNOSIS — N951 Menopausal and female climacteric states: Secondary | ICD-10-CM

## 2022-04-08 LAB — TESTOSTERONE, TOTAL AND FREE, SERUM (MAYO)
Testosterone, Free: 0.61 ng/dL (ref 0.13–0.84)
Testosterone, Total: 35 ng/dL (ref 8–60)

## 2022-04-08 LAB — ESTRADIOL: Estradiol: 44.9 pg/mL

## 2022-04-16 MED ORDER — finasteride (PROPECIA) 1 mg tablet
1 | ORAL_TABLET | ORAL | 2 refills | Status: AC
Start: 2022-04-16 — End: ?

## 2022-04-16 MED FILL — FINASTERIDE 1 MG TABLET: 1 mg | ORAL | 60 days supply | Qty: 30 | Fill #0

## 2022-04-22 ENCOUNTER — Other Ambulatory Visit: Admit: 2022-04-22 | Discharge: 2022-04-22 | Payer: PRIVATE HEALTH INSURANCE | Primary: MD

## 2022-04-22 DIAGNOSIS — N951 Menopausal and female climacteric states: Secondary | ICD-10-CM

## 2022-04-22 LAB — T4, FREE: T4, Free: 0.9 ng/dL (ref 0.6–1.2)

## 2022-04-22 LAB — PROGESTERONE: Progesterone: 3.9 ng/mL (ref <50.8)

## 2022-04-22 LAB — TSH: TSH - Thyroid Stimulating Hormone: 0.85 ÂµIU/mL (ref 0.45–5.33)

## 2022-04-22 LAB — T3, FREE: T3, Free: 3 pg/mL (ref 2.5–3.9)

## 2022-04-26 MED ORDER — finasteride (PROPECIA) 1 mg tablet
1 | ORAL_TABLET | ORAL | 2 refills | Status: AC
Start: 2022-04-26 — End: ?
  Filled 2022-05-01: qty 30, 60d supply, fill #0

## 2022-05-01 MED ORDER — finasteride (PROPECIA) 1 mg tablet
1 | ORAL_TABLET | Freq: Every day | ORAL | 0 refills | Status: AC
Start: 2022-05-01 — End: ?

## 2022-05-02 MED FILL — FINASTERIDE 1 MG TABLET: 1 mg | ORAL | 90 days supply | Qty: 90 | Fill #0

## 2022-05-24 MED ORDER — finasteride (PROPECIA) 1 mg tablet
1 | ORAL_TABLET | Freq: Every day | ORAL | 1 refills | Status: AC
Start: 2022-05-24 — End: ?
  Filled 2022-05-31: qty 90, 90d supply, fill #0

## 2022-05-31 ENCOUNTER — Other Ambulatory Visit: Admit: 2022-05-31 | Discharge: 2022-05-31 | Payer: PRIVATE HEALTH INSURANCE | Primary: MD

## 2022-05-31 DIAGNOSIS — N951 Menopausal and female climacteric states: Secondary | ICD-10-CM

## 2022-05-31 LAB — ESTRADIOL: Estradiol: 88.5 pg/mL

## 2022-07-08 ENCOUNTER — Ambulatory Visit
Admission: EM | Admit: 2022-07-08 | Discharge: 2022-07-08 | Disposition: A | Discharge status: Home / Self Care | Payer: Medicare PPO

## 2022-07-08 DIAGNOSIS — J3089 Other allergic rhinitis: Secondary | ICD-10-CM | POA: Diagnosis not present

## 2022-07-08 DIAGNOSIS — J0100 Acute maxillary sinusitis, unspecified: Secondary | ICD-10-CM

## 2022-07-08 DIAGNOSIS — J4521 Mild intermittent asthma with (acute) exacerbation: Secondary | ICD-10-CM

## 2022-07-08 DIAGNOSIS — J22 Unspecified acute lower respiratory infection: Secondary | ICD-10-CM

## 2022-07-08 DIAGNOSIS — B370 Candidal stomatitis: Secondary | ICD-10-CM

## 2022-07-08 MED ORDER — AZITHROMYCIN 250 MG PO TABS: ORAL_TABLET | ORAL | 0 refills | Status: DC

## 2022-07-08 MED ORDER — NYSTATIN 100000 UNIT/ML MT SUSP: 500000.0000 [IU] | Freq: Four times a day (QID) | OROMUCOSAL | 0 refills | Status: AC

## 2022-07-08 MED ORDER — METHYLPREDNISOLONE SODIUM SUCC 125 MG IJ SOLR
80.0000 mg | Freq: Once | INTRAMUSCULAR | Status: AC
  Administered 2022-07-08: 80 mg via INTRAMUSCULAR

## 2022-07-08 MED ORDER — PROMETHAZINE-DM 6.25-15 MG/5ML PO SYRP: 5.0000 mL | ORAL_SOLUTION | Freq: Four times a day (QID) | ORAL | 0 refills | Status: AC | PRN

## 2022-07-08 NOTE — ED Triage Notes (Signed)
Pt reports nasal congestion, cough and chest congestion x 2 1/2 weeks. Muicnex DM gives some relief.

## 2022-07-08 NOTE — ED Provider Notes (Signed)
[facility]    CSN: [id number] Arrival date & time: 07/08/22  7824      History   Chief Complaint Chief Complaint  Patient presents with   Cough    HPI Samantha Larsen is a 66 y.o. female.   Patient presenting today with 3-week history of progressively worsening nasal congestion, facial pressure, cough, chest congestion, wheezing, chest tightness.  Denies fever, chills, body aches, chest pain, abdominal pain, nausea vomiting or diarrhea.  History of seasonal allergies and asthma not currently on any inhalers but does take antihistamine and Flonase daily.  Also taking over-the-counter cold and congestion medications with minimal relief.  No known sick contacts recently.    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   Essential (primary) hypertension    Gastro-esophageal reflux disease with esophagitis    Hyperlipidemia    Pain in unspecified knee    Plantar fascial fibromatosis    Allergic rhinitis, unspecified    Obesity, unspecified    Unspecified osteoarthritis, unspecified site    Hx of colonic polyps 06/18/2019   Family hx of colon cancer 06/18/2019   S/P right knee arthroscopy 05/14/18    Derangement of posterior horn of medial meniscus of right knee     Past Surgical History:  Procedure Laterality Date   ABDOMINAL HYSTERECTOMY     COLONOSCOPY N/A 05/18/2014   Procedure: COLONOSCOPY;  Surgeon: Rogene Houston, MD;  Location: [facility];  Service: Endoscopy;  Laterality: N/A;  1200    COLONOSCOPY N/A 08/25/2019   Procedure: COLONOSCOPY;  Surgeon: Rogene Houston, MD;  Location: [facility];  Service: Endoscopy;  Laterality: N/A;  12   DILATION AND CURETTAGE OF UTERUS     x 2   KNEE ARTHROSCOPY WITH MEDIAL MENISECTOMY Right 05/14/2018   Procedure: RIGHT KNEE ARTHROSCOPY WITH PARTIAL MEDIAL AND LATERAL MENISCECTOMY;  Surgeon: Carole Civil, MD;  Location: [facility];  Service: Orthopedics;  Laterality: Right;   lasik     POLYPECTOMY  08/25/2019   Procedure: POLYPECTOMY;  Surgeon: Rogene Houston, MD;  Location: [facility];  Service: Endoscopy;;   TOTAL KNEE ARTHROPLASTY Right 03/14/2021   Procedure: TOTAL KNEE ARTHROPLASTY;  Surgeon: Susa Day, MD;  Location: [facility];  Service: Orthopedics;  Laterality: Right;   TUBAL LIGATION      OB History   No obstetric history on file.      Home Medications    Prior to Admission medications   Medication Sig Start Date End Date Taking? Authorizing Provider  azithromycin (ZITHROMAX) 250 MG tablet Take first 2 tablets together, then 1 every day until finished. 07/08/22  Yes Volney American, PA-C  naproxen sodium (ALEVE) 220 MG tablet Take 220 mg by mouth.   Yes [provider]  nystatin (MYCOSTATIN) 100000 UNIT/ML suspension Take 5 mLs (500,000 Units total) by mouth 4 (four) times daily. 07/08/22  Yes Volney American, PA-C  promethazine-dextromethorphan (PROMETHAZINE-DM) 6.25-15 MG/5ML syrup Take 5 mLs by  mouth 4 (four) times daily as needed. 07/08/22  Yes Volney American, PA-C  acetaminophen (TYLENOL) 500 MG tablet Take 500 mg by mouth every 6 (six) hours as needed for moderate pain.    [provider]  albuterol (PROVENTIL HFA;VENTOLIN HFA) 108 (90 BASE) MCG/ACT inhaler Inhale 1-2 puffs into the lungs every 6 (six) hours as needed for wheezing or shortness of breath.    [provider]  aspirin EC 81 MG tablet Take 1 tablet (81 mg total) by mouth 2 (two) times daily after a  meal. Day after surgery 03/14/21   Susa Day, MD  Cyanocobalamin (VITAMIN B-12) 5000 MCG TBDP Take 5,000 mcg by mouth at bedtime.     [provider]  docusate sodium (COLACE) 100 MG capsule Take 1 capsule (100 mg total) by mouth 2 (two) times daily as needed for mild constipation. 03/14/21   Susa Day, MD  fluticasone (FLONASE) 50 MCG/ACT nasal spray Place 1-2 sprays into both nostrils See admin instructions. Instill 2 sprays in each nare in the morning and 1 spray into each nare at night    [provider]  folic acid (FOLVITE) 956 MCG tablet Take 800 mcg by mouth at bedtime.     [provider]  guaiFENesin (MUCINEX) 600 MG 12 hr tablet Take 600 mg by mouth daily as needed for cough or to loosen phlegm.    [provider]  hydrochlorothiazide (HYDRODIURIL) 12.5 MG tablet Take 12.5 mg by mouth in the morning. 04/03/18   [provider]  HYDROcodone-acetaminophen (NORCO/VICODIN) 5-325 MG tablet Take 1-2 tablets by mouth every 4 (four) hours as needed for severe pain. 03/14/21   Susa Day, MD  lansoprazole (PREVACID) 30 MG capsule Take 30 mg by mouth in the morning.    [provider]  levocetirizine (XYZAL) 5 MG tablet Take 5 mg by mouth at bedtime.    [provider]  methocarbamol (ROBAXIN) 500 MG tablet Take 1 tablet (500 mg total) by mouth every 8 (eight) hours as needed for muscle spasms. 03/14/21   Susa Day, MD  Multiple Vitamins-Minerals (MULTIVITAMINS THER. W/MINERALS) TABS tablet Take 1 tablet by mouth at bedtime. Centrum Silver 50 +    [provider]  nadolol (CORGARD) 40 MG tablet Take 40 mg by mouth in the morning. 04/03/18   [provider]  Omega-3 Fatty Acids (FISH OIL PO) Take 1 capsule by mouth at bedtime.    [provider]  phenylephrine (NEO-SYNEPHRINE) 0.5 % nasal solution Place 1 drop into both nostrils every 6 (six) hours as needed for congestion.    [provider]   Polyethyl Glycol-Propyl Glycol (SYSTANE) 0.4-0.3 % GEL ophthalmic gel Place 1 application into both eyes at bedtime.    [provider]  polyethylene glycol (MIRALAX / GLYCOLAX) 17 g packet Mix 1 packet with 4-8 of liquid and take by mouth daily as directed 03/14/21   Susa Day, MD  Propylene Glycol 0.6 % SOLN Place 1 drop into both eyes 3 (three) times daily as needed (dry eyes).    [provider]  simvastatin (ZOCOR) 40 MG tablet Take 40 mg by mouth at bedtime.    [provider]  traMADol (ULTRAM) 50 MG tablet Take 50 mg by mouth 2 (two) times daily. 02/27/21   [provider]    Family History Family History  Problem Relation Age of Onset   Other Mother    Dementia Father    Heart murmur Father  High blood pressure Father    Arthritis Father    High blood pressure Sister    Arthritis Sister    Colon cancer Paternal Aunt     Social History Social History   Tobacco Use   Smoking status: Never   Smokeless tobacco: Never  Vaping Use   Vaping Use: Never used  Substance Use Topics   Alcohol use: No   Drug use: No     Allergies   Ibuprofen, Penicillins, and Wound dressing adhesive   Review of Systems Review of Systems Per HPI  Physical Exam Triage Vital Signs ED Triage Vitals  Enc Vitals Group     BP 07/08/22 0951 (!) 175/80     Pulse Rate 07/08/22 0951 60     Resp 07/08/22 0951 18     Temp 07/08/22 0951 98.3 F (36.8 C)     Temp Source 07/08/22 0951 Oral     SpO2 07/08/22 0951 97 %     Weight --      Height --      Head Circumference --      Peak Flow --      Pain Score 07/08/22 0950 0     Pain Loc --      Pain Edu? --      Excl. in [place]? --    No data found.  Updated Vital Signs BP (!) 175/80 (BP Location: Right Arm)   Pulse 60   Temp 98.3 F (36.8 C) (Oral)   Resp 18   SpO2 97%   Visual Acuity Right Eye Distance:   Left Eye Distance:   Bilateral Distance:    Right Eye Near:   Left Eye Near:     Bilateral Near:     Physical Exam Vitals and nursing note reviewed.  Constitutional:      Appearance: Normal appearance.  HENT:     Head: Atraumatic.     Right Ear: Tympanic membrane and external ear normal.     Left Ear: Tympanic membrane and external ear normal.     Nose: Congestion present.     Mouth/Throat:     Mouth: Mucous membranes are moist.     Pharynx: Posterior oropharyngeal erythema present.     Comments: White coating and irritation of tongue Eyes:     Extraocular Movements: Extraocular movements intact.     Conjunctiva/sclera: Conjunctivae normal.  Cardiovascular:     Rate and Rhythm: Normal rate and regular rhythm.     Heart sounds: Normal heart sounds.  Pulmonary:     Effort: Pulmonary effort is normal.     Breath sounds: Wheezing present.  Musculoskeletal:        General: Normal range of motion.     Cervical back: Normal range of motion and neck supple.  Skin:    General: Skin is warm and dry.  Neurological:     Mental Status: She is alert and oriented to person, place, and time.  Psychiatric:        Mood and Affect: Mood normal.        Thought Content: Thought content normal.      UC Treatments / Results  Labs (all labs ordered are listed, but only abnormal results are displayed) Labs Reviewed - No data to display  EKG   Radiology No results found.  Procedures Procedures (including critical care time)  Medications Ordered in UC Medications  methylPREDNISolone sodium succinate (SOLU-MEDROL) 125 mg/2 mL injection 80 mg (80 mg Intramuscular Given 07/08/22 1018)  Initial Impression / Assessment and Plan / UC Course  I have reviewed the triage vital signs and the nursing notes.  Pertinent labs & imaging results that were available during my care of the patient were reviewed by me and considered in my medical decision making (see chart for details).     Treat with IM Solu-Medrol, azithromycin, Phenergan DM for the upper respiratory  infection causing asthma exacerbation.  Nystatin rinse for oral thrush as well as probiotics, good oral hygiene.  Return for any worsening symptoms. Final Clinical Impressions(s) / UC Diagnoses   Final diagnoses:  Acute maxillary sinusitis, recurrence not specified  Lower respiratory infection  Seasonal allergic rhinitis due to other allergic trigger  Mild intermittent asthma with acute exacerbation  Thrush, oral   Discharge Instructions   None    ED Prescriptions     Medication Sig Dispense Auth. Provider   azithromycin (ZITHROMAX) 250 MG tablet Take first 2 tablets together, then 1 every day until finished. 6 tablet Volney American, [place]   nystatin (MYCOSTATIN) 100000 UNIT/ML suspension Take 5 mLs (500,000 Units total) by mouth 4 (four) times daily. 120 mL Volney American, [place]   promethazine-dextromethorphan (PROMETHAZINE-DM) 6.25-15 MG/5ML syrup Take 5 mLs by mouth 4 (four) times daily as needed. 100 mL Volney American, [place]      PDMP not reviewed this encounter.   Volney American, [place] 07/08/22 1304

## 2022-08-14 ENCOUNTER — Encounter (INDEPENDENT_AMBULATORY_CARE_PROVIDER_SITE_OTHER): Payer: Self-pay | Admitting: *Deleted

## 2022-08-31 MED FILL — FINASTERIDE 1 MG TABLET: 1 mg | ORAL | 90 days supply | Qty: 90 | Fill #1

## 2022-09-02 ENCOUNTER — Telehealth (INDEPENDENT_AMBULATORY_CARE_PROVIDER_SITE_OTHER): Payer: Self-pay | Admitting: *Deleted

## 2022-09-02 DIAGNOSIS — Z860100 Personal history of colon polyps, unspecified: Secondary | ICD-10-CM

## 2022-09-02 DIAGNOSIS — Z800 Family history of malignant neoplasm of digestive organs: Secondary | ICD-10-CM

## 2022-09-02 NOTE — Telephone Encounter (Signed)
Referring MD/PCP: Nicholes Rough  Procedure: Colonoscopy  Reason/Indication:  3 year recall  Has patient had this procedure before?  08/25/2019  If so, when, by whom and where?    Is there a family history of colon cancer?  Aunt, grandfather  Who?  What age when diagnosed?    Is patient diabetic? If yes, Type 1 or Type 2   no      Does patient have prosthetic heart valve or mechanical valve?  no  Do you have a pacemaker/defibrillator?  no  Has patient ever had endocarditis/atrial fibrillation? no  Does patient use oxygen? no  Has patient had joint replacement within last 12 months?  Yes 02/2021 knee replacement  Is patient constipated or do they take laxatives? yes  Does patient have a history of alcohol/drug use?  no  Have you had a stroke/heart attack last 6 mths? no  Do you take medicine for weight loss?  no  For female patients,: have you had a hysterectomy yes                      are you post menopausal yes                      do you still have your menstrual cycle no  Is patient on blood thinner such as Coumadin, Plavix and/or Aspirin? no  Medications:  Current Outpatient Medications on File Prior to Visit  Medication Sig Dispense Refill   acetaminophen (TYLENOL) 500 MG tablet Take 500 mg by mouth every 6 (six) hours as needed for moderate pain.     albuterol (PROVENTIL HFA;VENTOLIN HFA) 108 (90 BASE) MCG/ACT inhaler Inhale 1-2 puffs into the lungs every 6 (six) hours as needed for wheezing or shortness of breath.     Cyanocobalamin (VITAMIN B-12) 5000 MCG TBDP Take 5,000 mcg by mouth at bedtime.      docusate sodium (COLACE) 100 MG capsule Take 1 capsule (100 mg total) by mouth 2 (two) times daily as needed for mild constipation. 30 capsule 1   fluticasone (FLONASE) 50 MCG/ACT nasal spray Place 1-2 sprays into both nostrils See admin instructions. Instill 2 sprays in each nare in the morning and 1 spray into each nare at night     folic acid (FOLVITE) 810 MCG tablet  Take 800 mcg by mouth at bedtime.      guaiFENesin (MUCINEX) 600 MG 12 hr tablet Take 600 mg by mouth daily as needed for cough or to loosen phlegm.     hydrochlorothiazide (HYDRODIURIL) 12.5 MG tablet Take 12.5 mg by mouth in the morning.     lansoprazole (PREVACID) 30 MG capsule Take 30 mg by mouth in the morning.     levocetirizine (XYZAL) 5 MG tablet Take 5 mg by mouth at bedtime.     Multiple Vitamins-Minerals (MULTIVITAMINS THER. W/MINERALS) TABS tablet Take 1 tablet by mouth at bedtime. Centrum Silver 50 +     nadolol (CORGARD) 40 MG tablet Take 40 mg by mouth in the morning.     naproxen sodium (ALEVE) 220 MG tablet Take 220 mg by mouth.     nystatin (MYCOSTATIN) 100000 UNIT/ML suspension Take 5 mLs (500,000 Units total) by mouth 4 (four) times daily. 120 mL 0   Omega-3 Fatty Acids (FISH OIL PO) Take 1 capsule by mouth at bedtime.     phenylephrine (NEO-SYNEPHRINE) 0.5 % nasal solution Place 1 drop into both nostrils every 6 (six) hours as needed for  congestion.     Polyethyl Glycol-Propyl Glycol (SYSTANE) 0.4-0.3 % GEL ophthalmic gel Place 1 application into both eyes at bedtime.     polyethylene glycol (MIRALAX / GLYCOLAX) 17 g packet Mix 1 packet with 4-8 of liquid and take by mouth daily as directed 14 each 0   promethazine-dextromethorphan (PROMETHAZINE-DM) 6.25-15 MG/5ML syrup Take 5 mLs by mouth 4 (four) times daily as needed. 100 mL 0   Propylene Glycol 0.6 % SOLN Place 1 drop into both eyes 3 (three) times daily as needed (dry eyes).     simvastatin (ZOCOR) 40 MG tablet Take 40 mg by mouth at bedtime.     No current facility-administered medications on file prior to visit.     Allergies:  Allergies  Allergen Reactions   Ibuprofen Swelling    Welping - in large doses   Penicillins Hives and Swelling    Has patient had a PCN reaction causing immediate rash, facial/tongue/throat swelling, SOB or lightheadedness with hypotension: YES Has patient had a PCN reaction causing  severe rash involving mucus membranes or skin necrosis: NO Has patient had a PCN reaction that required hospitalization: No Has patient had a PCN reaction occurring within the last 10 years: No If all of the above answers are "NO", then may proceed with Cephalosporin use.    Wound Dressing Adhesive     Blisters - [place]

## 2022-09-03 MED ORDER — PEG-KCL-NACL-NASULF-NA ASC-C 100 G PO SOLR: 1.0000 | Freq: Once | ORAL | 0 refills | Status: AC

## 2022-09-03 NOTE — Telephone Encounter (Addendum)
Spoke with pt. Scheduled for 1/31 at 1pm. Aware needs lab work prior. Rx for prep sent to pharmacy. She requested same prep as prior which was moviprep. Instructions sent to mychart.   PA approved via cohere. Auth# [id number] , DOS: 09/25/2022 - 12/24/2022

## 2022-09-03 NOTE — Telephone Encounter (Signed)
Any Room  Thanks

## 2022-09-03 NOTE — Addendum Note (Signed)
Addended by: Cheron Every on: 09/03/2022 04:25 PM   Modules accepted: Orders

## 2022-09-04 ENCOUNTER — Telehealth: Payer: Self-pay | Admitting: *Deleted

## 2022-09-04 NOTE — Telephone Encounter (Signed)
Pt called. Had lab work done 1/9 at [facility]. I have printed off to have Jonni scan in. Will not need bmet as this is within 4 weeks procedure

## 2022-09-05 ENCOUNTER — Encounter: Payer: Self-pay | Admitting: *Deleted

## 2022-09-05 MED ORDER — NA SULFATE-K SULFATE-MG SULF 17.5-3.13-1.6 GM/177ML PO SOLN: ORAL | 0 refills | Status: DC

## 2022-09-06 NOTE — Telephone Encounter (Signed)
Questionnaire from recall,no referral needed 

## 2022-09-12 ENCOUNTER — Ambulatory Visit (INDEPENDENT_AMBULATORY_CARE_PROVIDER_SITE_OTHER): Payer: Medicare PPO | Admitting: Gastroenterology

## 2022-09-25 ENCOUNTER — Ambulatory Visit (HOSPITAL_COMMUNITY): Payer: Medicare PPO | Admitting: Anesthesiology

## 2022-09-25 ENCOUNTER — Ambulatory Visit (HOSPITAL_BASED_OUTPATIENT_CLINIC_OR_DEPARTMENT_OTHER): Payer: Medicare PPO | Admitting: Anesthesiology

## 2022-09-25 ENCOUNTER — Encounter (HOSPITAL_COMMUNITY): Payer: Self-pay | Admitting: Gastroenterology

## 2022-09-25 ENCOUNTER — Other Ambulatory Visit: Payer: Self-pay

## 2022-09-25 ENCOUNTER — Encounter (HOSPITAL_COMMUNITY)
Admission: RE | Disposition: A | Discharge status: Home / Self Care | Payer: Self-pay | Source: Ambulatory Visit | Attending: Gastroenterology

## 2022-09-25 ENCOUNTER — Ambulatory Visit (HOSPITAL_COMMUNITY)
Admission: RE | Admit: 2022-09-25 | Discharge: 2022-09-25 | Disposition: A | Discharge status: Home / Self Care | Payer: Medicare PPO | Source: Ambulatory Visit | Attending: Gastroenterology | Admitting: Gastroenterology

## 2022-09-25 DIAGNOSIS — K648 Other hemorrhoids: Secondary | ICD-10-CM

## 2022-09-25 DIAGNOSIS — M1990 Unspecified osteoarthritis, unspecified site: Secondary | ICD-10-CM | POA: Insufficient documentation

## 2022-09-25 DIAGNOSIS — I10 Essential (primary) hypertension: Secondary | ICD-10-CM | POA: Diagnosis not present

## 2022-09-25 DIAGNOSIS — K5730 Diverticulosis of large intestine without perforation or abscess without bleeding: Secondary | ICD-10-CM

## 2022-09-25 DIAGNOSIS — Z86010 Personal history of colonic polyps: Secondary | ICD-10-CM | POA: Insufficient documentation

## 2022-09-25 DIAGNOSIS — K635 Polyp of colon: Secondary | ICD-10-CM | POA: Diagnosis not present

## 2022-09-25 DIAGNOSIS — Z79899 Other long term (current) drug therapy: Secondary | ICD-10-CM | POA: Diagnosis not present

## 2022-09-25 DIAGNOSIS — K621 Rectal polyp: Secondary | ICD-10-CM

## 2022-09-25 DIAGNOSIS — D122 Benign neoplasm of ascending colon: Secondary | ICD-10-CM | POA: Diagnosis not present

## 2022-09-25 DIAGNOSIS — J45909 Unspecified asthma, uncomplicated: Secondary | ICD-10-CM | POA: Diagnosis not present

## 2022-09-25 DIAGNOSIS — D120 Benign neoplasm of cecum: Secondary | ICD-10-CM | POA: Insufficient documentation

## 2022-09-25 DIAGNOSIS — K219 Gastro-esophageal reflux disease without esophagitis: Secondary | ICD-10-CM | POA: Insufficient documentation

## 2022-09-25 DIAGNOSIS — D123 Benign neoplasm of transverse colon: Secondary | ICD-10-CM | POA: Diagnosis not present

## 2022-09-25 DIAGNOSIS — Z1211 Encounter for screening for malignant neoplasm of colon: Secondary | ICD-10-CM | POA: Diagnosis present

## 2022-09-25 DIAGNOSIS — Z6841 Body Mass Index (BMI) 40.0 and over, adult: Secondary | ICD-10-CM | POA: Insufficient documentation

## 2022-09-25 DIAGNOSIS — D125 Benign neoplasm of sigmoid colon: Secondary | ICD-10-CM | POA: Insufficient documentation

## 2022-09-25 DIAGNOSIS — D128 Benign neoplasm of rectum: Secondary | ICD-10-CM | POA: Insufficient documentation

## 2022-09-25 HISTORY — PX: POLYPECTOMY: SHX149

## 2022-09-25 HISTORY — PX: COLONOSCOPY WITH PROPOFOL: SHX5780

## 2022-09-25 LAB — HM COLONOSCOPY

## 2022-09-25 SURGERY — COLONOSCOPY WITH PROPOFOL
Anesthesia: General

## 2022-09-25 MED ORDER — LACTATED RINGERS IV SOLN: INTRAVENOUS | Status: DC

## 2022-09-25 MED ORDER — PROPOFOL 500 MG/50ML IV EMUL
INTRAVENOUS | Status: DC | PRN
  Administered 2022-09-25: 150 ug/kg/min via INTRAVENOUS

## 2022-09-25 MED ORDER — PROPOFOL 500 MG/50ML IV EMUL
INTRAVENOUS | Status: AC
  Filled 2022-09-25: qty 50

## 2022-09-25 MED ORDER — PROPOFOL 10 MG/ML IV BOLUS
INTRAVENOUS | Status: DC | PRN
  Administered 2022-09-25: 60 mg via INTRAVENOUS

## 2022-09-25 NOTE — H&P (Addendum)
Samantha Larsen is an 67 y.o. female.   Chief Complaint: History of colon polyps HPI: 67 year old female with past medical history of GERD, hypertension, hyperlipidemia, obesity, who came to the hospital for history of colonic polyps.  Last colonoscopy in 2020, was found to have 5 tubular adenomas.  The patient denies having any complaints such as melena, hematochezia, abdominal pain or distention, change in her bowel movement consistency or frequency, no changes in weight recently.  Aunt had cancer, father history of colon polyps.   Past Medical History:  Diagnosis Date   Allergic rhinitis, unspecified    Arthritis    Asthma    Essential (primary) hypertension    Gastro-esophageal reflux disease with esophagitis    GERD (gastroesophageal reflux disease)    Hypercholesteremia    Hyperlipidemia    Hypertension    Obesity, unspecified    Pain in unspecified knee    Plantar fascial fibromatosis    Unspecified osteoarthritis, unspecified site     Past Surgical History:  Procedure Laterality Date   ABDOMINAL HYSTERECTOMY     COLONOSCOPY N/A 05/18/2014   Procedure: COLONOSCOPY;  Surgeon: Rogene Houston, MD;  [facility];  Service: Endoscopy;  Laterality: N/A;  1200   COLONOSCOPY N/A 08/25/2019   Procedure: COLONOSCOPY;  Surgeon: Rogene Houston, MD;  [facility];  Service: Endoscopy;  Laterality: N/A;  12   DILATION AND CURETTAGE OF UTERUS     x 2   KNEE ARTHROSCOPY WITH MEDIAL MENISECTOMY Right 05/14/2018   Procedure: RIGHT KNEE ARTHROSCOPY WITH PARTIAL MEDIAL AND LATERAL MENISCECTOMY;  Surgeon: Carole Civil, MD;  Location: [facility];  Service: Orthopedics;  Laterality: Right;   lasik     POLYPECTOMY  08/25/2019   Procedure: POLYPECTOMY;  Surgeon: Rogene Houston, MD;  [facility];  Service: Endoscopy;;   TOTAL KNEE ARTHROPLASTY Right 03/14/2021   Procedure: TOTAL KNEE ARTHROPLASTY;  Surgeon: Susa Day, MD;  Location: [facility];  Service:  Orthopedics;  Laterality: Right;   TUBAL LIGATION      Family History  Problem Relation Age of Onset   Other Mother    Dementia Father    Heart murmur Father    High blood pressure Father    Arthritis Father    High blood pressure Sister    Arthritis Sister    Colon cancer Paternal Aunt    Social History:  reports that she has never smoked. She has never used smokeless tobacco. She reports that she does not drink alcohol and does not use drugs.  Allergies:  Allergies  Allergen Reactions   Ibuprofen Swelling    Welping - in large doses   Penicillins Hives and Swelling    Has patient had a PCN reaction causing immediate rash, facial/tongue/throat swelling, SOB or lightheadedness with hypotension: YES Has patient had a PCN reaction causing severe rash involving mucus membranes or skin necrosis: NO Has patient had a PCN reaction that required hospitalization: No Has patient had a PCN reaction occurring within the last 10 years: No If all of the above answers are "NO", then may proceed with Cephalosporin use.    Wound Dressing Adhesive     Blisters - [place]    Medications Prior to Admission  Medication Sig Dispense Refill   azelastine (ASTELIN) 0.1 % nasal spray Place 2 sprays into both nostrils daily as needed for rhinitis. Use in each nostril as directed     cyanocobalamin (VITAMIN B12) 1000 MCG tablet Take 1,000  mcg by mouth daily.     docusate sodium (COLACE) 100 MG capsule Take 1 capsule (100 mg total) by mouth 2 (two) times daily as needed for mild constipation. 30 capsule 1   fluticasone (FLONASE) 50 MCG/ACT nasal spray Place 2 sprays into both nostrils 2 (two) times daily.     folic acid (FOLVITE) 027 MCG tablet Take 800 mcg by mouth at bedtime.      hydrochlorothiazide (HYDRODIURIL) 12.5 MG tablet Take 12.5 mg by mouth in the morning.     lansoprazole (PREVACID) 30 MG capsule Take 30 mg by mouth in the morning.     levocetirizine (XYZAL) 5 MG tablet Take 5 mg by mouth  at bedtime.     Multiple Vitamins-Minerals (MULTIVITAMINS THER. W/MINERALS) TABS tablet Take 1 tablet by mouth at bedtime. Centrum Silver 50 +     Na Sulfate-K Sulfate-Mg Sulf 17.5-3.13-1.6 GM/177ML SOLN As directed 354 mL 0   nadolol (CORGARD) 40 MG tablet Take 40 mg by mouth in the morning.     naproxen sodium (ALEVE) 220 MG tablet Take 220 mg by mouth 2 (two) times daily as needed (pain).     Omega-3 Fatty Acids (FISH OIL PO) Take 2,000 mg by mouth at bedtime.     Polyethyl Glycol-Propyl Glycol (SYSTANE) 0.4-0.3 % GEL ophthalmic gel Place 1 application  into both eyes 2 (two) times daily.     Probiotic Product (PROBIOTIC PO) Take 1 capsule by mouth every evening.     simvastatin (ZOCOR) 40 MG tablet Take 40 mg by mouth at bedtime.     albuterol (PROVENTIL HFA;VENTOLIN HFA) 108 (90 BASE) MCG/ACT inhaler Inhale 1-2 puffs into the lungs every 6 (six) hours as needed for wheezing or shortness of breath.     guaiFENesin (MUCINEX) 600 MG 12 hr tablet Take 600 mg by mouth daily as needed for cough or to loosen phlegm.     nystatin (MYCOSTATIN) 100000 UNIT/ML suspension Take 5 mLs (500,000 Units total) by mouth 4 (four) times daily. (Patient not taking: Reported on 09/20/2022) 120 mL 0   polyethylene glycol (MIRALAX / GLYCOLAX) 17 g packet Mix 1 packet with 4-8 of liquid and take by mouth daily as directed 14 each 0   promethazine-dextromethorphan (PROMETHAZINE-DM) 6.25-15 MG/5ML syrup Take 5 mLs by mouth 4 (four) times daily as needed. (Patient not taking: Reported on 09/20/2022) 100 mL 0    No results found for this or any previous visit (from the past 48 hour(s)). No results found.  Review of Systems  All other systems reviewed and are negative.   Blood pressure 139/62, pulse (!) 55, temperature 98.4 F (36.9 C), temperature source Oral, resp. rate 19, height '4\' 11"'$  (1.499 m), weight 90.3 kg, SpO2 98 %. Physical Exam  GENERAL: The patient is AO x3, in no acute distress. HEENT: Head is  normocephalic and atraumatic. EOMI are intact. Mouth is well hydrated and without lesions. NECK: Supple. No masses LUNGS: Clear to auscultation. No presence of rhonchi/wheezing/rales. Adequate chest expansion HEART: RRR, normal s1 and s2. ABDOMEN: Soft, nontender, no guarding, no peritoneal signs, and nondistended. BS +. No masses. EXTREMITIES: Without any cyanosis, clubbing, rash, lesions or edema. NEUROLOGIC: AOx3, no focal motor deficit. SKIN: no jaundice, no rashes  Assessment/Plan 67 year old female with past medical history of GERD, hypertension, hyperlipidemia, obesity, who came to the hospital for history of colonic polyps.  Will proceed with colonoscopy.  Harvel Quale, MD 09/25/2022, 9:46 AM

## 2022-09-25 NOTE — Anesthesia Preprocedure Evaluation (Signed)
Anesthesia Evaluation  Patient identified by MRN, date of birth, ID band Patient awake    Reviewed: Allergy & Precautions, H&P , NPO status , Patient's Chart, lab work & pertinent test results, reviewed documented beta blocker date and time   Airway Mallampati: II  TM Distance: >3 FB Neck ROM: Full    Dental  (+) Dental Advisory Given, Teeth Intact   Pulmonary shortness of breath, asthma    Pulmonary exam normal breath sounds clear to auscultation       Cardiovascular hypertension, Pt. on medications and Pt. on home beta blockers Normal cardiovascular exam Rhythm:Regular Rate:Normal     Neuro/Psych negative neurological ROS  negative psych ROS   GI/Hepatic Neg liver ROS,GERD  Medicated and Controlled,,  Endo/Other    Morbid obesity  Renal/GU negative Renal ROS  negative genitourinary   Musculoskeletal  (+) Arthritis , Osteoarthritis,    Abdominal   Peds negative pediatric ROS (+)  Hematology negative hematology ROS (+)   Anesthesia Other Findings   Reproductive/Obstetrics negative OB ROS                             Anesthesia Physical Anesthesia Plan  ASA: 3  Anesthesia Plan: General   Post-op Pain Management: Minimal or no pain anticipated   Induction: Intravenous  PONV Risk Score and Plan: 1 and Propofol infusion  Airway Management Planned: Nasal Cannula and Natural Airway  Additional Equipment:   Intra-op Plan:   Post-operative Plan:   Informed Consent: I have reviewed the patients History and Physical, chart, labs and discussed the procedure including the risks, benefits and alternatives for the proposed anesthesia with the patient or authorized representative who has indicated his/her understanding and acceptance.     Dental advisory given  Plan Discussed with: CRNA and Surgeon  Anesthesia Plan Comments:         Anesthesia Quick Evaluation

## 2022-09-25 NOTE — Discharge Instructions (Addendum)
You are being discharged to home.  Resume your previous diet.  We are waiting for your pathology results.  Your physician has recommended a repeat colonoscopy for surveillance based on pathology results.  

## 2022-09-25 NOTE — Op Note (Signed)
[facility] Patient Name: Samantha Larsen Procedure Date: 09/25/2022 9:36 AM MRN: [id number] Date of Birth: Aug 27, 1955 Attending MD: Maylon Peppers , , [id number] CSN: [id number] Age: 67 Admit Type: Ambulatory Procedure:                Colonoscopy Indications:              Surveillance: Personal history of adenomatous                            polyps on last colonoscopy > 3 years ago Providers:                Maylon Peppers, Crystal Page, Aram Candela Referring MD:              Medicines:                Monitored Anesthesia Care Complications:            No immediate complications. Estimated Blood Loss:     Estimated blood loss: none. Procedure:                Pre-Anesthesia Assessment:                           - Prior to the procedure, a History and Physical                            was performed, and patient medications, allergies                            and sensitivities were reviewed. The patient's                            tolerance of previous anesthesia was reviewed.                           - The risks and benefits of the procedure and the                            sedation options and risks were discussed with the                            patient. All questions were answered and informed                            consent was obtained.                           - ASA Grade Assessment: II - A patient with mild                            systemic disease.                           After obtaining informed consent, the colonoscope                            was passed under direct  vision. Throughout the                            procedure, the patient's blood pressure, pulse, and                            oxygen saturations were monitored continuously. The                            [id number]-HQ190L ([id number]) scope was introduced through                            the anus and advanced to the the cecum, identified                            by appendiceal orifice  and ileocecal valve. The                            colonoscopy was performed without difficulty. The                            patient tolerated the procedure well. The quality                            of the bowel preparation was good. Scope In: 9:52:22 AM Scope Out: 10:15:48 AM Scope Withdrawal Time: 0 hours 20 minutes 30 seconds  Total Procedure Duration: 0 hours 23 minutes 26 seconds  Findings:      The perianal and digital rectal examinations were normal.      Three sessile polyps were found in the transverse colon, ascending colon       and cecum. The polyps were 4 to 8 mm in size. These polyps were removed       with a cold snare. Resection and retrieval were complete.      Two sessile polyps were found in the rectum and sigmoid colon. The       polyps were 2 to 5 mm in size. These polyps were removed with a cold       snare. Resection and retrieval were complete.      Scattered small-mouthed diverticula were found in the sigmoid colon.      Non-bleeding internal hemorrhoids were found during retroflexion. The       hemorrhoids were small. Impression:               - Three 4 to 8 mm polyps in the transverse colon,                            in the ascending colon and in the cecum, removed                            with a cold snare. Resected and retrieved.                           - Two 2 to 5 mm polyps in the rectum and in the  sigmoid colon, removed with a cold snare. Resected                            and retrieved.                           - Diverticulosis in the sigmoid colon.                           - Non-bleeding internal hemorrhoids. Moderate Sedation:      Per Anesthesia Care Recommendation:           - Discharge patient to home (ambulatory).                           - Resume previous diet.                           - Await pathology results.                           - Repeat colonoscopy for surveillance based on                             pathology results. Procedure Code(s):        --- Professional ---                           [phone], Colonoscopy, flexible; with removal of                            tumor(s), polyp(s), or other lesion(s) by snare                            technique Diagnosis Code(s):        --- Professional ---                           Z86.010, Personal history of colonic polyps                           D12.3, Benign neoplasm of transverse colon (hepatic                            flexure or splenic flexure)                           D12.2, Benign neoplasm of ascending colon                           D12.0, Benign neoplasm of cecum                           D12.8, Benign neoplasm of rectum                           D12.5, Benign neoplasm of sigmoid colon  K64.8, Other hemorrhoids                           K57.30, Diverticulosis of large intestine without                            perforation or abscess without bleeding CPT copyright 2022 [employer]. All rights reserved. The codes documented in this report are preliminary and upon coder review may  be revised to meet current compliance requirements. Maylon Peppers, MD Maylon Peppers,  09/25/2022 10:20:29 AM This report has been signed electronically. Number of Addenda: 0

## 2022-09-25 NOTE — Anesthesia Postprocedure Evaluation (Signed)
Anesthesia Post Note  Patient: Samantha Larsen  Procedure(s) Performed: COLONOSCOPY WITH PROPOFOL POLYPECTOMY INTESTINAL  Patient location during evaluation: Phase II Anesthesia Type: General Level of consciousness: awake and alert and oriented Pain management: pain level controlled Vital Signs Assessment: post-procedure vital signs reviewed and stable Respiratory status: spontaneous breathing, nonlabored ventilation and respiratory function stable Cardiovascular status: blood pressure returned to baseline and stable Postop Assessment: no apparent nausea or vomiting Anesthetic complications: no  No notable events documented.   Last Vitals:  Vitals:   09/25/22 0842 09/25/22 1019  BP: 139/62 (!) 115/59  Pulse:  (!) 54  Resp:  17  Temp:  (!) 36.4 C  SpO2:  99%    Last Pain:  Vitals:   09/25/22 1019  TempSrc: Axillary  PainSc: 0-No pain                 Maikayla Beggs C Ajwa Kimberley

## 2022-09-25 NOTE — Transfer of Care (Signed)
Immediate Anesthesia Transfer of Care Note  Patient: Samantha Larsen  Procedure(s) Performed: COLONOSCOPY WITH PROPOFOL POLYPECTOMY INTESTINAL  Patient Location: PACU  Anesthesia Type:General  Level of Consciousness: awake, alert , and oriented  Airway & Oxygen Therapy: Patient Spontanous Breathing  Post-op Assessment: Report given to RN, Post -op Vital signs reviewed and stable, Patient moving all extremities X 4, and Patient able to stick tongue midline  Post vital signs: Reviewed  Last Vitals:  Vitals Value Taken Time  BP 115/59 09/25/22 1019  Temp 36.4 C 09/25/22 1019  Pulse 54 09/25/22 1019  Resp 17 09/25/22 1019  SpO2 99 % 09/25/22 1019    Last Pain:  Vitals:   09/25/22 1019  TempSrc: Axillary  PainSc: 0-No pain      Patients Stated Pain Goal: 5 (41/66/06 3016)  Complications: No notable events documented.

## 2022-09-26 ENCOUNTER — Telehealth (INDEPENDENT_AMBULATORY_CARE_PROVIDER_SITE_OTHER): Payer: Self-pay | Admitting: *Deleted

## 2022-09-26 ENCOUNTER — Encounter (INDEPENDENT_AMBULATORY_CARE_PROVIDER_SITE_OTHER): Payer: Self-pay | Admitting: *Deleted

## 2022-09-26 LAB — SURGICAL PATHOLOGY

## 2022-09-26 NOTE — Telephone Encounter (Signed)
Samantha Larsen please add to reminder file. Thanks.   Patient called back and said she missed a call. I discussed with her results Dr. Jenetta Downer left on her voicemail and she verbalized understanding.   Per Dr. Jenetta Downer -  I reviewed the pathology results. I called the patient and to inform about the results, but the patient did not pick up the phone. I left a detailed voice message with the findings and recommendations. 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

## 2022-09-26 NOTE — Telephone Encounter (Signed)
3 yr TCS noted in recall

## 2022-10-01 ENCOUNTER — Encounter (HOSPITAL_COMMUNITY): Payer: Self-pay | Admitting: Gastroenterology

## 2022-10-24 ENCOUNTER — Encounter: Payer: Self-pay | Admitting: Radiology

## 2022-11-21 MED FILL — FINASTERIDE 1 MG TABLET: 1 mg | ORAL | 90 days supply | Qty: 90 | Fill #1

## 2022-11-25 MED ORDER — finasteride (PROPECIA) 1 mg tablet
1 | ORAL_TABLET | Freq: Every day | ORAL | 1 refills | Status: AC
Start: 2022-11-25 — End: ?

## 2022-12-02 MED FILL — FINASTERIDE 1 MG TABLET: 1 mg | ORAL | 90 days supply | Qty: 90 | Fill #0

## 2023-01-17 MED ORDER — finasteride (PROPECIA) 1 mg tablet
1 | ORAL_TABLET | Freq: Every day | ORAL | 1 refills | Status: AC
Start: 2023-01-17 — End: ?
  Filled 2023-02-11: qty 90, 90d supply, fill #1

## 2023-01-17 MED FILL — FINASTERIDE 1 MG TABLET: 1 mg | ORAL | 90 days supply | Qty: 90 | Fill #0

## 2023-04-21 IMAGING — DX DG KNEE 1-2V*R*
2 series · 2 of 2 positions shown · non-contrast
Comparison: 12/09/2017

CLINICAL DATA: Total knee replacement

EXAM:
RIGHT KNEE - 1-2 VIEW

[knee ap]
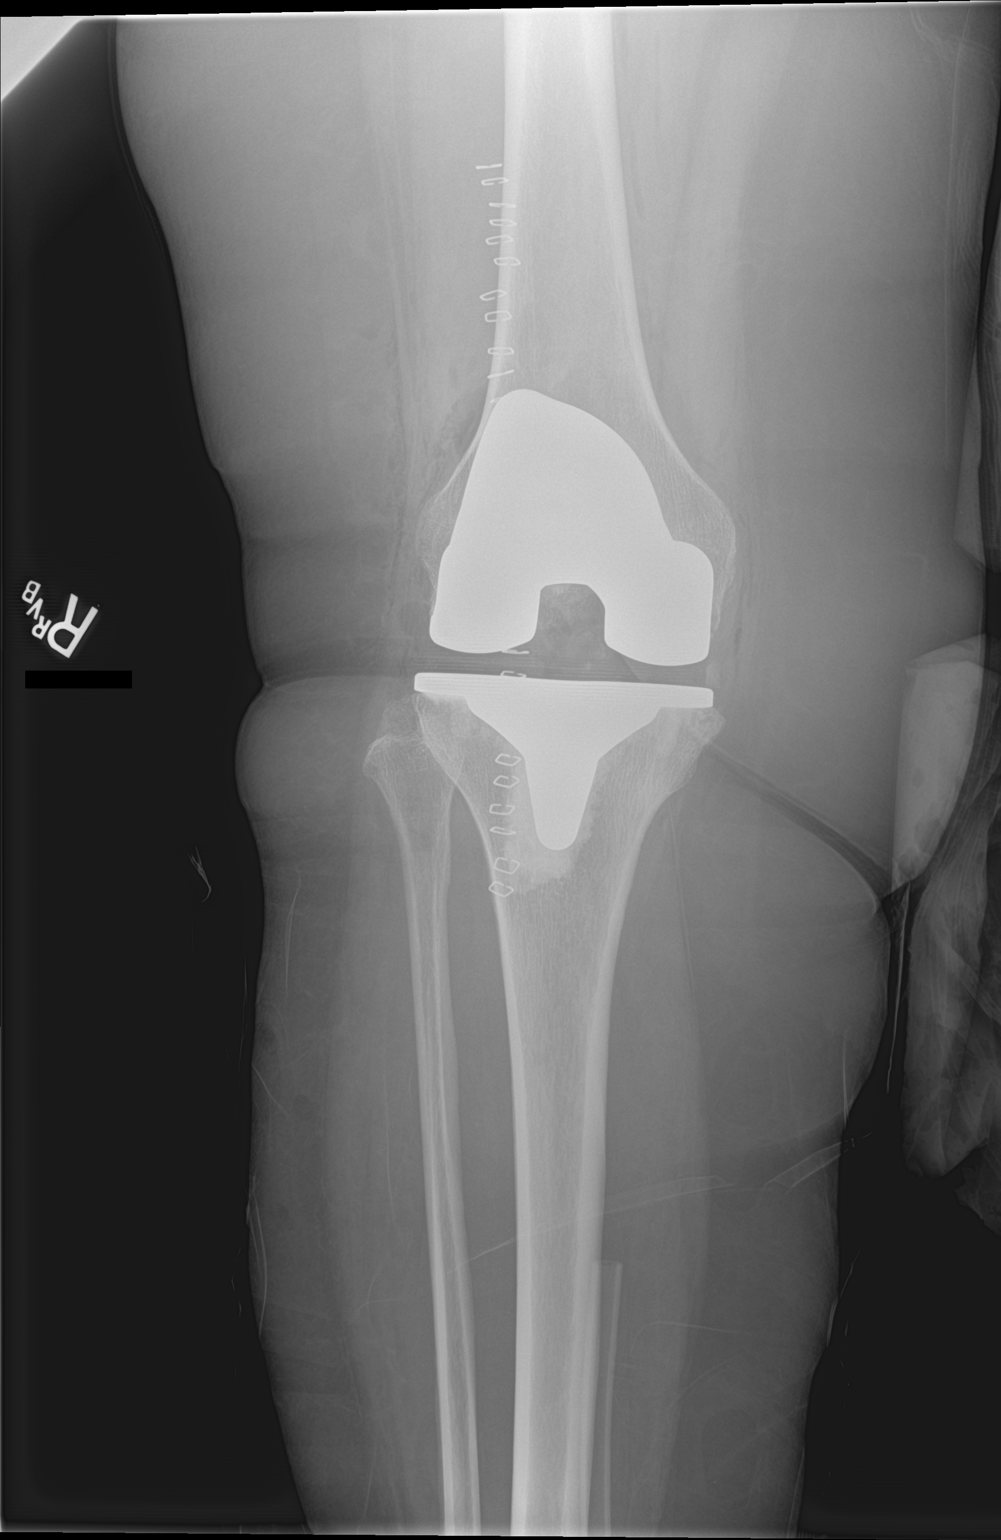

[knee lat]
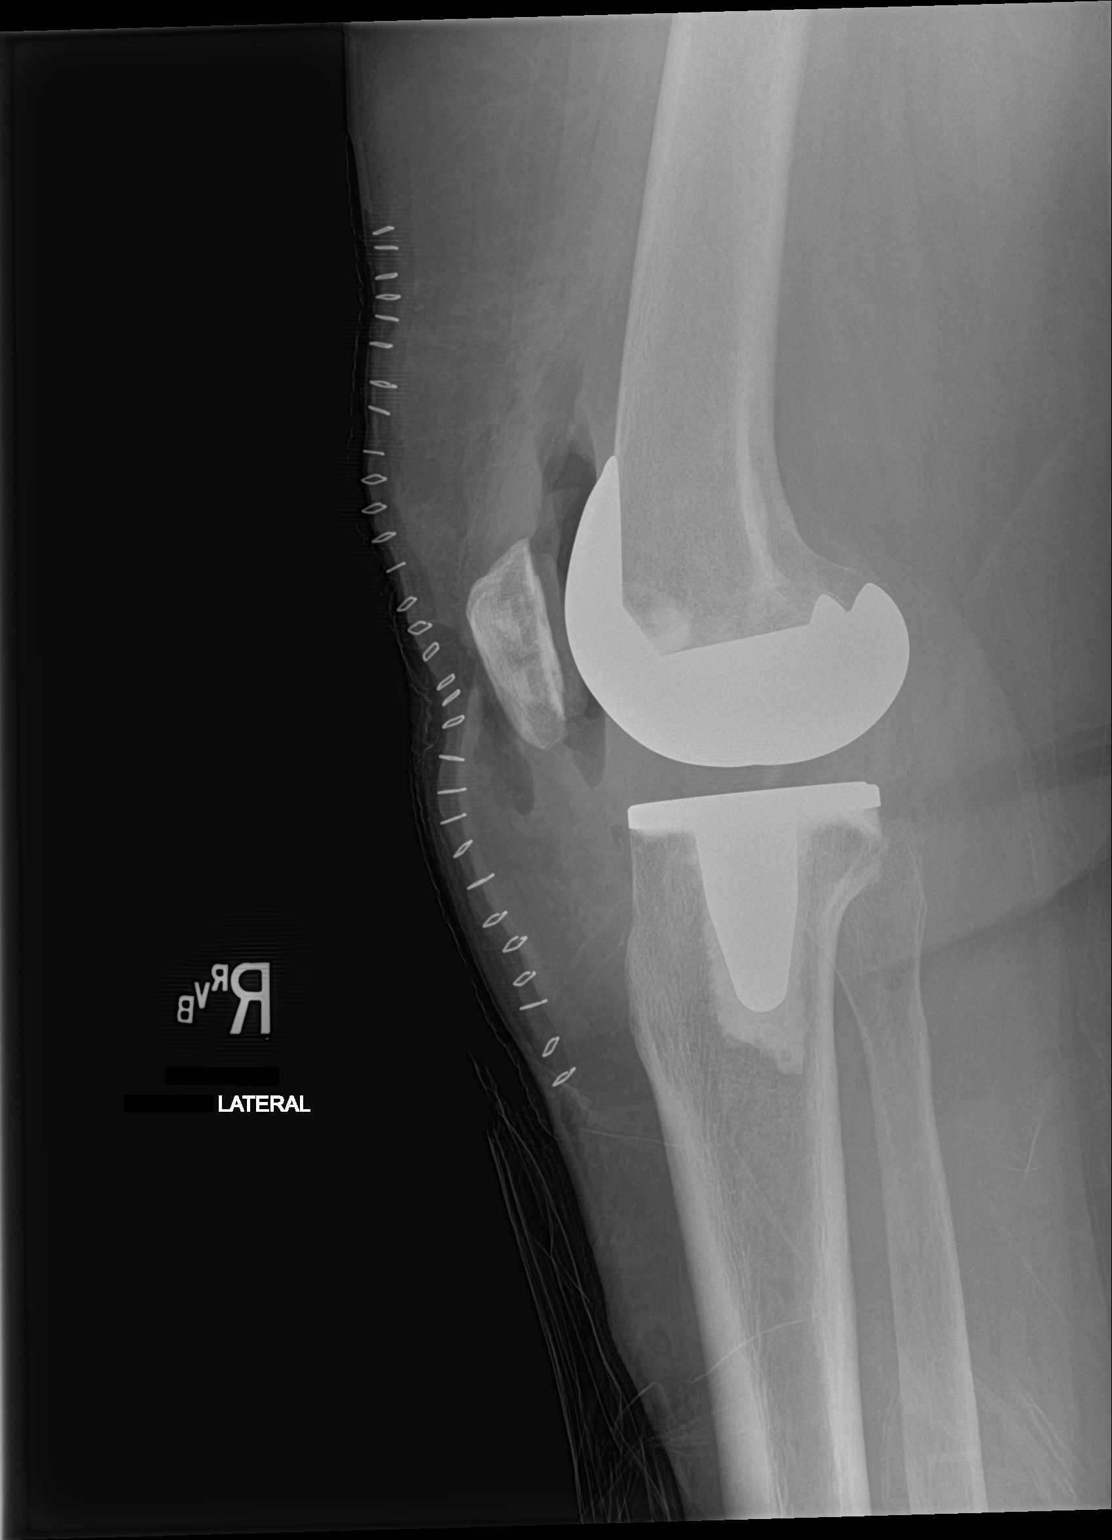

[2 of 2 positions shown; findings below may reference images not displayed]

FINDINGS: Total knee arthroplasty which is well seated. No acute fracture.
Soft tissue swelling and gas which is expected.
IMPRESSION: No unexpected finding after total knee arthroplasty.

## 2023-04-29 MED FILL — FINASTERIDE 1 MG TABLET: 1 mg | ORAL | 90 days supply | Qty: 90 | Fill #1

## 2023-06-23 ENCOUNTER — Other Ambulatory Visit: Admit: 2023-06-23 | Discharge: 2023-06-23 | Payer: PRIVATE HEALTH INSURANCE | Primary: MD

## 2023-06-23 DIAGNOSIS — N951 Menopausal and female climacteric states: Secondary | ICD-10-CM

## 2023-06-23 LAB — DHEA-SULFATE PANEL: DHEA-Sulfate Panel: 115 ug/dL (ref 12–133)

## 2023-06-23 LAB — PROGESTERONE: Progesterone: 4.2 ng/mL (ref <50.8)

## 2023-06-23 LAB — ESTRADIOL: Estradiol: 70.1 pg/mL

## 2023-12-18 ENCOUNTER — Encounter: Payer: PRIVATE HEALTH INSURANCE | Primary: MD

## 2023-12-23 ENCOUNTER — Ambulatory Visit: Payer: PRIVATE HEALTH INSURANCE | Primary: MD

## 2023-12-23 ENCOUNTER — Other Ambulatory Visit: Admit: 2023-12-23 | Discharge: 2023-12-23 | Payer: Commercial Managed Care - PPO | Primary: MD

## 2023-12-23 DIAGNOSIS — N951 Menopausal and female climacteric states: Secondary | ICD-10-CM

## 2023-12-23 LAB — COMPREHENSIVE METABOLIC PANEL
ALT - Alanine Aminotransferase: 21 IU/L (ref 7–52)
AST - Aspartate Aminotransferase: 23 IU/L (ref 8–39)
Albumin/Globulin Ratio: 2 (ref >0.9)
Albumin: 4.8 g/dL (ref 3.5–5.0)
Alkaline Phosphatase: 39 IU/L (ref 34–104)
Anion Gap: 11 mmol/L (ref 4–13)
BUN: 15 mg/dL (ref 6–23)
Bilirubin Total: 0.5 mg/dL (ref 0.3–1.2)
CO2 - Carbon Dioxide: 24 mmol/L (ref 21–31)
Calcium: 9.6 mg/dL (ref 8.6–10.3)
Chloride: 105 mmol/L (ref 98–111)
Creatinine: 0.81 mg/dL (ref 0.55–1.10)
Globulin: 2.4 g/dL (ref 2.0–3.7)
Glomerular Filtration Rate Estimate (Female): >60 mL/min/{1.73_m2} (ref >=60)
Glucose: 97 mg/dL (ref 80–99)
Potassium: 4 mmol/L (ref 3.5–5.1)
Protein Total: 7.2 g/dL (ref 6.0–8.0)
Sodium: 140 mmol/L (ref 135–143)

## 2023-12-23 LAB — CORONARY RISK LIPID PANEL REFLEX DIRECT LDL
Cholesterol, HDL: 88 mg/dL (ref >=40)
Cholesterol: 239 mg/dL — ABNORMAL HIGH (ref <200)
LDL Calculated: 138 mg/dL — ABNORMAL HIGH (ref <100)
Triglyceride: 67 mg/dL (ref 30–149)

## 2023-12-23 LAB — DIHYDROTESTOSTERONE: Dihydrotestosterone: <50 pg/mL (ref <=128)

## 2023-12-23 LAB — TESTOSTERONE, TOTAL AND FREE, SERUM (MAYO)
Testosterone, Free: 0.47 ng/dL (ref 0.13–0.84)
Testosterone, Total: 28 ng/dL (ref 8–60)

## 2023-12-23 LAB — DHEA-SULFATE PANEL: DHEA-Sulfate Panel: 101 ug/dL (ref 12–133)

## 2023-12-23 LAB — ESTRADIOL: Estradiol: 51.7 pg/mL

## 2024-01-21 ENCOUNTER — Other Ambulatory Visit: Admit: 2024-01-21 | Discharge: 2024-01-21 | Payer: Medicare PPO | Primary: MD

## 2024-01-21 DIAGNOSIS — E559 Vitamin D deficiency, unspecified: Secondary | ICD-10-CM

## 2024-01-21 LAB — 25-OH HYDROXY VITAMIN D, CALCIFEROL, TOTAL OF D2 & D3: Vitamin D, 25-OH Total: 41.9 ng/mL (ref 30.0–100.0)

## 2024-07-06 ENCOUNTER — Other Ambulatory Visit: Admit: 2024-07-06 | Discharge: 2024-07-06 | Payer: Medicare PPO | Primary: MD

## 2024-07-06 DIAGNOSIS — N951 Menopausal and female climacteric states: Principal | ICD-10-CM

## 2024-07-06 LAB — TESTOSTERONE, TOTAL AND FREE, SERUM (MAYO)
Testosterone, Free: 0.73 ng/dL (ref 0.13–0.84)
Testosterone, Total: 37 ng/dL (ref 8–60)

## 2024-07-06 LAB — PROGESTERONE: Progesterone: 2.1 ng/mL (ref <50.8)

## 2024-07-06 LAB — ESTRADIOL: Estradiol: 271.9 pg/mL
